# Patient Record
Sex: Female | Born: 1935 | Race: Black or African American | Hispanic: No | Smoking: Never smoker
Health system: Southern US, Community
[De-identification: ages and names within clinical notes are randomized; demographics above are authoritative.]

## PROBLEM LIST (undated history)

## (undated) DIAGNOSIS — E7800 Pure hypercholesterolemia, unspecified: Secondary | ICD-10-CM

## (undated) DIAGNOSIS — I639 Cerebral infarction, unspecified: Secondary | ICD-10-CM

## (undated) DIAGNOSIS — Z889 Allergy status to unspecified drugs, medicaments and biological substances status: Secondary | ICD-10-CM

## (undated) DIAGNOSIS — G459 Transient cerebral ischemic attack, unspecified: Secondary | ICD-10-CM

## (undated) DIAGNOSIS — R1013 Epigastric pain: Secondary | ICD-10-CM

## (undated) DIAGNOSIS — K259 Gastric ulcer, unspecified as acute or chronic, without hemorrhage or perforation: Secondary | ICD-10-CM

## (undated) DIAGNOSIS — R9431 Abnormal electrocardiogram [ECG] [EKG]: Secondary | ICD-10-CM

## (undated) DIAGNOSIS — M25473 Effusion, unspecified ankle: Secondary | ICD-10-CM

## (undated) DIAGNOSIS — C801 Malignant (primary) neoplasm, unspecified: Secondary | ICD-10-CM

## (undated) DIAGNOSIS — K219 Gastro-esophageal reflux disease without esophagitis: Secondary | ICD-10-CM

## (undated) DIAGNOSIS — D649 Anemia, unspecified: Secondary | ICD-10-CM

## (undated) DIAGNOSIS — I10 Essential (primary) hypertension: Secondary | ICD-10-CM

## (undated) HISTORY — DX: Essential (primary) hypertension: I10

## (undated) HISTORY — DX: Cerebral infarction, unspecified: I63.9

## (undated) HISTORY — PX: SHOULDER OPEN ROTATOR CUFF REPAIR: SHX2407

## (undated) HISTORY — DX: Transient cerebral ischemic attack, unspecified: G45.9

## (undated) HISTORY — PX: APPENDECTOMY: SHX54

## (undated) HISTORY — DX: Pure hypercholesterolemia, unspecified: E78.00

## (undated) HISTORY — PX: VAGINAL HYSTERECTOMY: SUR661

## (undated) HISTORY — DX: Epigastric pain: R10.13

## (undated) HISTORY — DX: Gastric ulcer, unspecified as acute or chronic, without hemorrhage or perforation: K25.9

## (undated) HISTORY — PX: CATARACT EXTRACTION, BILATERAL: SHX1313

## (undated) HISTORY — DX: Hemochromatosis, unspecified: E83.119

## (undated) HISTORY — PX: BREAST BIOPSY: SHX20

---

## 2001-09-20 ENCOUNTER — Ambulatory Visit (HOSPITAL_COMMUNITY): Admission: RE | Admit: 2001-09-20 | Discharge: 2001-09-20 | Payer: Self-pay | Admitting: General Surgery

## 2001-09-20 ENCOUNTER — Encounter: Payer: Self-pay | Admitting: General Surgery

## 2002-07-06 ENCOUNTER — Encounter: Payer: Self-pay | Admitting: General Surgery

## 2002-07-06 ENCOUNTER — Ambulatory Visit (HOSPITAL_COMMUNITY): Admission: RE | Admit: 2002-07-06 | Discharge: 2002-07-06 | Payer: Self-pay | Admitting: General Surgery

## 2002-09-21 ENCOUNTER — Encounter: Payer: Self-pay | Admitting: General Surgery

## 2002-09-21 ENCOUNTER — Ambulatory Visit (HOSPITAL_COMMUNITY): Admission: RE | Admit: 2002-09-21 | Discharge: 2002-09-21 | Payer: Self-pay | Admitting: General Surgery

## 2003-09-25 ENCOUNTER — Ambulatory Visit (HOSPITAL_COMMUNITY): Admission: RE | Admit: 2003-09-25 | Discharge: 2003-09-25 | Payer: Self-pay | Admitting: General Surgery

## 2003-10-04 ENCOUNTER — Ambulatory Visit (HOSPITAL_COMMUNITY): Admission: RE | Admit: 2003-10-04 | Discharge: 2003-10-04 | Payer: Self-pay | Admitting: General Surgery

## 2004-09-17 ENCOUNTER — Emergency Department (HOSPITAL_COMMUNITY): Admission: EM | Admit: 2004-09-17 | Discharge: 2004-09-17 | Payer: Self-pay | Admitting: Emergency Medicine

## 2004-10-31 ENCOUNTER — Ambulatory Visit (HOSPITAL_COMMUNITY): Admission: RE | Admit: 2004-10-31 | Discharge: 2004-10-31 | Payer: Self-pay | Admitting: General Surgery

## 2004-11-18 ENCOUNTER — Ambulatory Visit: Payer: Self-pay | Admitting: Orthopedic Surgery

## 2004-12-17 ENCOUNTER — Ambulatory Visit: Payer: Self-pay | Admitting: Orthopedic Surgery

## 2004-12-17 ENCOUNTER — Ambulatory Visit (HOSPITAL_COMMUNITY): Admission: RE | Admit: 2004-12-17 | Discharge: 2004-12-17 | Payer: Self-pay | Admitting: Orthopedic Surgery

## 2004-12-19 ENCOUNTER — Encounter (HOSPITAL_COMMUNITY): Admission: RE | Admit: 2004-12-19 | Discharge: 2005-01-18 | Payer: Self-pay | Admitting: Orthopedic Surgery

## 2004-12-19 ENCOUNTER — Ambulatory Visit: Payer: Self-pay | Admitting: Orthopedic Surgery

## 2004-12-30 ENCOUNTER — Ambulatory Visit: Payer: Self-pay | Admitting: Orthopedic Surgery

## 2005-01-20 ENCOUNTER — Encounter (HOSPITAL_COMMUNITY): Admission: RE | Admit: 2005-01-20 | Discharge: 2005-02-19 | Payer: Self-pay | Admitting: Orthopedic Surgery

## 2005-02-06 ENCOUNTER — Ambulatory Visit: Payer: Self-pay | Admitting: Orthopedic Surgery

## 2005-02-21 ENCOUNTER — Encounter (HOSPITAL_COMMUNITY): Admission: RE | Admit: 2005-02-21 | Discharge: 2005-03-23 | Payer: Self-pay | Admitting: Orthopedic Surgery

## 2005-03-20 ENCOUNTER — Ambulatory Visit: Payer: Self-pay | Admitting: Orthopedic Surgery

## 2005-03-24 ENCOUNTER — Encounter (HOSPITAL_COMMUNITY): Admission: RE | Admit: 2005-03-24 | Discharge: 2005-04-23 | Payer: Self-pay | Admitting: Orthopedic Surgery

## 2005-06-18 ENCOUNTER — Ambulatory Visit: Payer: Self-pay | Admitting: Orthopedic Surgery

## 2005-12-11 ENCOUNTER — Ambulatory Visit: Payer: Self-pay | Admitting: Orthopedic Surgery

## 2005-12-30 ENCOUNTER — Ambulatory Visit (HOSPITAL_COMMUNITY): Admission: RE | Admit: 2005-12-30 | Discharge: 2005-12-30 | Payer: Self-pay | Admitting: General Surgery

## 2006-02-05 ENCOUNTER — Ambulatory Visit: Payer: Self-pay | Admitting: Orthopedic Surgery

## 2006-03-19 ENCOUNTER — Ambulatory Visit: Payer: Self-pay | Admitting: Orthopedic Surgery

## 2006-06-17 ENCOUNTER — Ambulatory Visit: Payer: Self-pay | Admitting: Orthopedic Surgery

## 2006-09-30 ENCOUNTER — Ambulatory Visit: Payer: Self-pay | Admitting: Orthopedic Surgery

## 2006-12-15 ENCOUNTER — Ambulatory Visit: Payer: Self-pay | Admitting: Orthopedic Surgery

## 2006-12-17 ENCOUNTER — Encounter (HOSPITAL_COMMUNITY): Admission: RE | Admit: 2006-12-17 | Discharge: 2007-01-16 | Payer: Self-pay | Admitting: Orthopedic Surgery

## 2007-01-04 ENCOUNTER — Ambulatory Visit (HOSPITAL_COMMUNITY): Admission: RE | Admit: 2007-01-04 | Discharge: 2007-01-04 | Payer: Self-pay | Admitting: Family Medicine

## 2007-01-19 ENCOUNTER — Encounter (HOSPITAL_COMMUNITY): Admission: RE | Admit: 2007-01-19 | Discharge: 2007-02-18 | Payer: Self-pay | Admitting: Orthopedic Surgery

## 2007-01-26 ENCOUNTER — Ambulatory Visit (HOSPITAL_COMMUNITY): Admission: RE | Admit: 2007-01-26 | Discharge: 2007-01-26 | Payer: Self-pay | Admitting: Family Medicine

## 2007-01-28 ENCOUNTER — Ambulatory Visit: Payer: Self-pay | Admitting: Orthopedic Surgery

## 2007-02-03 ENCOUNTER — Ambulatory Visit: Payer: Self-pay | Admitting: Orthopedic Surgery

## 2007-02-05 ENCOUNTER — Ambulatory Visit (HOSPITAL_COMMUNITY): Admission: RE | Admit: 2007-02-05 | Discharge: 2007-02-05 | Payer: Self-pay | Admitting: Orthopedic Surgery

## 2007-02-11 ENCOUNTER — Ambulatory Visit: Payer: Self-pay | Admitting: Orthopedic Surgery

## 2007-02-18 ENCOUNTER — Ambulatory Visit: Payer: Self-pay | Admitting: Orthopedic Surgery

## 2007-08-17 ENCOUNTER — Encounter: Payer: Self-pay | Admitting: Orthopedic Surgery

## 2008-01-05 ENCOUNTER — Ambulatory Visit (HOSPITAL_COMMUNITY): Admission: RE | Admit: 2008-01-05 | Discharge: 2008-01-05 | Payer: Self-pay | Admitting: Family Medicine

## 2009-01-09 ENCOUNTER — Ambulatory Visit (HOSPITAL_COMMUNITY): Admission: RE | Admit: 2009-01-09 | Discharge: 2009-01-09 | Payer: Self-pay | Admitting: Family Medicine

## 2009-03-14 ENCOUNTER — Encounter (INDEPENDENT_AMBULATORY_CARE_PROVIDER_SITE_OTHER): Payer: Self-pay | Admitting: Neurology

## 2009-03-14 ENCOUNTER — Inpatient Hospital Stay (HOSPITAL_COMMUNITY): Admission: EM | Admit: 2009-03-14 | Discharge: 2009-03-16 | Payer: Self-pay | Admitting: Emergency Medicine

## 2009-03-15 ENCOUNTER — Encounter (INDEPENDENT_AMBULATORY_CARE_PROVIDER_SITE_OTHER): Payer: Self-pay | Admitting: Neurology

## 2009-04-18 ENCOUNTER — Ambulatory Visit (HOSPITAL_COMMUNITY): Admission: RE | Admit: 2009-04-18 | Discharge: 2009-04-18 | Payer: Self-pay | Admitting: Family Medicine

## 2009-10-20 ENCOUNTER — Emergency Department (HOSPITAL_COMMUNITY): Admission: EM | Admit: 2009-10-20 | Discharge: 2009-10-20 | Payer: Self-pay | Admitting: Emergency Medicine

## 2010-01-10 ENCOUNTER — Ambulatory Visit (HOSPITAL_COMMUNITY): Admission: RE | Admit: 2010-01-10 | Discharge: 2010-01-10 | Payer: Self-pay | Admitting: Family Medicine

## 2010-09-23 ENCOUNTER — Ambulatory Visit (HOSPITAL_COMMUNITY)
Admission: RE | Admit: 2010-09-23 | Discharge: 2010-09-23 | Payer: Self-pay | Source: Home / Self Care | Attending: Family Medicine | Admitting: Family Medicine

## 2010-11-17 ENCOUNTER — Emergency Department (HOSPITAL_COMMUNITY): Payer: Medicare Other

## 2010-11-17 ENCOUNTER — Emergency Department (HOSPITAL_COMMUNITY)
Admission: EM | Admit: 2010-11-17 | Discharge: 2010-11-17 | Disposition: A | Discharge status: Home / Self Care | Payer: Medicare Other | Attending: Emergency Medicine | Admitting: Emergency Medicine

## 2010-11-17 DIAGNOSIS — K2990 Gastroduodenitis, unspecified, without bleeding: Secondary | ICD-10-CM | POA: Insufficient documentation

## 2010-11-17 DIAGNOSIS — Z79899 Other long term (current) drug therapy: Secondary | ICD-10-CM | POA: Insufficient documentation

## 2010-11-17 DIAGNOSIS — Z8673 Personal history of transient ischemic attack (TIA), and cerebral infarction without residual deficits: Secondary | ICD-10-CM | POA: Insufficient documentation

## 2010-11-17 DIAGNOSIS — D649 Anemia, unspecified: Secondary | ICD-10-CM | POA: Insufficient documentation

## 2010-11-17 DIAGNOSIS — K2970 Gastritis, unspecified, without bleeding: Secondary | ICD-10-CM | POA: Insufficient documentation

## 2010-11-17 DIAGNOSIS — R1013 Epigastric pain: Secondary | ICD-10-CM | POA: Insufficient documentation

## 2010-11-17 DIAGNOSIS — M129 Arthropathy, unspecified: Secondary | ICD-10-CM | POA: Insufficient documentation

## 2010-11-17 DIAGNOSIS — I10 Essential (primary) hypertension: Secondary | ICD-10-CM | POA: Insufficient documentation

## 2010-11-17 LAB — TROPONIN I: Troponin I: 0.01 ng/mL (ref 0.00–0.06)

## 2010-11-17 LAB — URINALYSIS, ROUTINE W REFLEX MICROSCOPIC
Bilirubin Urine: NEGATIVE
Glucose, UA: NEGATIVE mg/dL
Hgb urine dipstick: NEGATIVE
Specific Gravity, Urine: 1.01 (ref 1.005–1.030)
pH: 7.5 (ref 5.0–8.0)

## 2010-11-17 LAB — COMPREHENSIVE METABOLIC PANEL
AST: 17 U/L (ref 0–37)
Albumin: 2.6 g/dL — ABNORMAL LOW (ref 3.5–5.2)
Chloride: 104 mEq/L (ref 96–112)
Creatinine, Ser: 1.05 mg/dL (ref 0.4–1.2)
GFR calc Af Amer: >60 mL/min (ref >60)
Potassium: 4.1 mEq/L (ref 3.5–5.1)
Total Bilirubin: 0.4 mg/dL (ref 0.3–1.2)
Total Protein: 6.1 g/dL (ref 6.0–8.3)

## 2010-11-17 LAB — DIFFERENTIAL
Lymphs Abs: 1.5 10*3/uL (ref 0.7–4.0)
Monocytes Relative: 5 % (ref 3–12)
Neutro Abs: 8.1 10*3/uL — ABNORMAL HIGH (ref 1.7–7.7)
Neutrophils Relative %: 78 % — ABNORMAL HIGH (ref 43–77)

## 2010-11-17 LAB — CK TOTAL AND CKMB (NOT AT ARMC)
CK, MB: 1.7 ng/mL (ref 0.3–4.0)
Relative Index: INVALID (ref 0.0–2.5)
Total CK: 69 U/L (ref 7–177)

## 2010-11-17 LAB — CBC
Hemoglobin: 9.3 g/dL — ABNORMAL LOW (ref 12.0–15.0)
MCH: 30.6 pg (ref 26.0–34.0)
MCV: 92.8 fL (ref 78.0–100.0)
RBC: 3.04 MIL/uL — ABNORMAL LOW (ref 3.87–5.11)
WBC: 10.3 10*3/uL (ref 4.0–10.5)

## 2010-11-17 LAB — POCT CARDIAC MARKERS
CKMB, poc: 2.8 ng/mL (ref 1.0–8.0)
Myoglobin, poc: 163 ng/mL (ref 12–200)
Troponin i, poc: 0.12 ng/mL — ABNORMAL HIGH (ref 0.00–0.09)

## 2010-11-17 MED ORDER — IOHEXOL 300 MG/ML  SOLN
100.0000 mL | Freq: Once | INTRAMUSCULAR | Status: AC | PRN
  Administered 2010-11-17: 100 mL via INTRAVENOUS

## 2010-11-18 LAB — URINE CULTURE
Colony Count: NO GROWTH
Culture: NO GROWTH

## 2010-12-08 LAB — GLUCOSE, CAPILLARY: Glucose-Capillary: 92 mg/dL (ref 70–99)

## 2010-12-09 LAB — DIFFERENTIAL
Basophils Absolute: 0 10*3/uL (ref 0.0–0.1)
Eosinophils Relative: 1 % (ref 0–5)
Lymphocytes Relative: 18 % (ref 12–46)
Lymphs Abs: 1.5 10*3/uL (ref 0.7–4.0)
Monocytes Absolute: 0.4 10*3/uL (ref 0.1–1.0)
Neutro Abs: 6.1 10*3/uL (ref 1.7–7.7)

## 2010-12-09 LAB — POCT I-STAT, CHEM 8
Calcium, Ion: 1.13 mmol/L (ref 1.12–1.32)
Glucose, Bld: 96 mg/dL (ref 70–99)
HCT: 37 % (ref 36.0–46.0)
Hemoglobin: 12.6 g/dL (ref 12.0–15.0)
TCO2: 28 mmol/L (ref 0–100)

## 2010-12-09 LAB — COMPREHENSIVE METABOLIC PANEL
AST: 28 U/L (ref 0–37)
Albumin: 3.2 g/dL — ABNORMAL LOW (ref 3.5–5.2)
BUN: 13 mg/dL (ref 6–23)
Calcium: 9.7 mg/dL (ref 8.4–10.5)
Chloride: 101 mEq/L (ref 96–112)
Creatinine, Ser: 0.89 mg/dL (ref 0.4–1.2)
GFR calc Af Amer: >60 mL/min (ref >60)
GFR calc non Af Amer: >60 mL/min (ref >60)
Total Bilirubin: 0.6 mg/dL (ref 0.3–1.2)

## 2010-12-09 LAB — BASIC METABOLIC PANEL
BUN: 10 mg/dL (ref 6–23)
CO2: 29 mEq/L (ref 19–32)
Chloride: 106 mEq/L (ref 96–112)
Creatinine, Ser: 0.85 mg/dL (ref 0.4–1.2)
Glucose, Bld: 95 mg/dL (ref 70–99)
Potassium: 3.7 mEq/L (ref 3.5–5.1)

## 2010-12-09 LAB — CBC
HCT: 37.4 % (ref 36.0–46.0)
Platelets: 227 10*3/uL (ref 150–400)
RDW: 13.9 % (ref 11.5–15.5)
WBC: 8 10*3/uL (ref 4.0–10.5)

## 2010-12-09 LAB — CK TOTAL AND CKMB (NOT AT ARMC)
CK, MB: 1.7 ng/mL (ref 0.3–4.0)
Relative Index: INVALID (ref 0.0–2.5)

## 2010-12-09 LAB — LIPID PANEL
Cholesterol: 234 mg/dL — ABNORMAL HIGH (ref 0–200)
HDL: 69 mg/dL (ref >39)
Total CHOL/HDL Ratio: 3.4 RATIO
VLDL: 44 mg/dL — ABNORMAL HIGH (ref 0–40)

## 2010-12-09 LAB — GLUCOSE, CAPILLARY: Glucose-Capillary: 86 mg/dL (ref 70–99)

## 2010-12-09 LAB — URINALYSIS, ROUTINE W REFLEX MICROSCOPIC
Glucose, UA: NEGATIVE mg/dL
Hgb urine dipstick: NEGATIVE
Ketones, ur: NEGATIVE mg/dL
Protein, ur: NEGATIVE mg/dL
Urobilinogen, UA: 0.2 mg/dL (ref 0.0–1.0)

## 2010-12-09 LAB — PROTIME-INR: Prothrombin Time: 13.6 seconds (ref 11.6–15.2)

## 2010-12-09 LAB — HEMOGLOBIN A1C: Hgb A1c MFr Bld: 5.6 % (ref 4.6–6.1)

## 2010-12-09 LAB — TROPONIN I: Troponin I: 0.02 ng/mL (ref 0.00–0.06)

## 2010-12-09 LAB — APTT: aPTT: 27 seconds (ref 24–37)

## 2010-12-18 ENCOUNTER — Ambulatory Visit (INDEPENDENT_AMBULATORY_CARE_PROVIDER_SITE_OTHER): Payer: Medicare Other | Admitting: Internal Medicine

## 2010-12-18 DIAGNOSIS — D649 Anemia, unspecified: Secondary | ICD-10-CM

## 2010-12-18 DIAGNOSIS — R1013 Epigastric pain: Secondary | ICD-10-CM

## 2010-12-18 DIAGNOSIS — R143 Flatulence: Secondary | ICD-10-CM

## 2010-12-20 ENCOUNTER — Other Ambulatory Visit (INDEPENDENT_AMBULATORY_CARE_PROVIDER_SITE_OTHER): Payer: Self-pay | Admitting: Internal Medicine

## 2010-12-20 ENCOUNTER — Encounter (HOSPITAL_BASED_OUTPATIENT_CLINIC_OR_DEPARTMENT_OTHER): Payer: Medicare Other | Admitting: Internal Medicine

## 2010-12-20 ENCOUNTER — Ambulatory Visit (HOSPITAL_COMMUNITY)
Admission: RE | Admit: 2010-12-20 | Discharge: 2010-12-20 | Disposition: A | Discharge status: Home / Self Care | Payer: Medicare Other | Source: Ambulatory Visit | Attending: Internal Medicine | Admitting: Internal Medicine

## 2010-12-20 DIAGNOSIS — K449 Diaphragmatic hernia without obstruction or gangrene: Secondary | ICD-10-CM | POA: Insufficient documentation

## 2010-12-20 DIAGNOSIS — I10 Essential (primary) hypertension: Secondary | ICD-10-CM | POA: Insufficient documentation

## 2010-12-20 DIAGNOSIS — Z79899 Other long term (current) drug therapy: Secondary | ICD-10-CM | POA: Insufficient documentation

## 2010-12-20 DIAGNOSIS — R1013 Epigastric pain: Secondary | ICD-10-CM

## 2010-12-20 DIAGNOSIS — K257 Chronic gastric ulcer without hemorrhage or perforation: Secondary | ICD-10-CM | POA: Insufficient documentation

## 2010-12-20 DIAGNOSIS — K259 Gastric ulcer, unspecified as acute or chronic, without hemorrhage or perforation: Secondary | ICD-10-CM

## 2010-12-20 DIAGNOSIS — K921 Melena: Secondary | ICD-10-CM | POA: Insufficient documentation

## 2010-12-20 DIAGNOSIS — R933 Abnormal findings on diagnostic imaging of other parts of digestive tract: Secondary | ICD-10-CM

## 2010-12-20 HISTORY — PX: ESOPHAGOGASTRODUODENOSCOPY: SHX1529

## 2011-01-05 NOTE — Op Note (Signed)
  NAMEGRANT, Dawn Church               ACCOUNT NO.:  [id number]  MEDICAL RECORD NO.:  [id number]           PATIENT TYPE:  O  LOCATION:  [facility]                          FACILITY:  [facility]  PHYSICIAN:  Lionel December, M.D.    DATE OF BIRTH:  10-05-1927  DATE OF PROCEDURE:  12/20/2010 DATE OF DISCHARGE:                              OPERATIVE REPORT   PROCEDURE:  Esophagogastroduodenoscopy.  INDICATIONS:  Dawn Church is an 77 year old African American female who experienced sudden onset of severe epigastric pain and was seen in the emergency room.  She had a CT which showed thickening to antral wall. She was seen in the office earlier this week and asked to discontinue her meloxicam.  She is undergoing diagnostic EGD.  Please note that when she was seen in the office her stool was guaiac positive.  Procedure risks were reviewed with the patient.  Informed consent was obtained.  MEDICATIONS FOR CONSCIOUS SEDATION:  Cetacaine spray for pharyngeal topical anesthesia, Demerol 25 mg IV, Versed 5 mg IV in divided dose.  FINDINGS:  Procedure performed in endoscopy suite.  The patient's vital signs and O2 saturations were monitored during the procedure and remained stable.  The patient was placed in left lateral recumbent position and Pentax videoscope was passed through oropharynx without any difficulty into esophagus.  Esophagus:  The mucosa of the esophagus was normal.  GE junction was located at 36 cm with noncritical ring and hiatus was at 38.  Stomach:  It was empty and distended very well with insufflation.  Folds of proximal stomach are normal.  Examination of mucosa and antrum revealed patchy erythema and a deep ulcer at least 20 mm in diameter. It had clean base.  The pyloric channel was patent.  Angularis, fundus, and cardia were examined by retroflexing scope and were unremarkable.  Duodenum:  Bulbar mucosa was normal.  Scope was passed into second part of duodenum where mucosa and folds  were normal.  Endoscope was withdrawn.  On the way out, multiple biopsies were taken from this ulcer margin. Endoscope was withdrawn.  The patient tolerated the procedure well.  FINAL DIAGNOSES: 1. Small sliding hiatal hernia with noncritical ring at     gastroesophageal junction. 2. Large gastric ulcer at antrum, suspect this is a chronic ulcer due     to nonsteroidal anti-inflammatory drug therapy.  Multiple biopsies     were taken for histology.  RECOMMENDATIONS:  No NSAIDs.  She should increase her Protonix to 40 mg twice daily.  Prescription written for 60 with 3 refills.  I will be contacting the patient results of biopsy and further recommendations.  Presuming histology is benign, she will return for repeat EGD in 10-12 weeks.  If biopsy is negative for H. pylori, then we will consider H. pylori serology.     Lionel December, M.D.     NR/MEDQ  D:  12/20/2010  T:  12/21/2010  Job:  [id number]  cc:   Angus G. Renard Matter, MD Fax: [phone]  Electronically Signed by Lionel December M.D. on 01/05/2011 09:49:01 PM

## 2011-01-05 NOTE — Consult Note (Signed)
NAMEALLANAH, Dawn Church               ACCOUNT NO.:  [id number]  MEDICAL RECORD NO.:  [id number]           PATIENT TYPE: AMB.  LOCATION: [place].                   [facility]  PHYSICIAN:  Lionel December, M.D.    DATE OF BIRTH:  1928/08/14  DATE :  12/18/2010                                 CONSULTATION   REASON FOR CONSULTATION:  Epigastric pain.  HISTORY OF PRESENT ILLNESS:  Dawn Church is an 77 year old female presenting today with complaints that on November 17, 2010 she had epigastric pain. She states she had frequent belching and flatus.  She was seen in the emergency room that day.  She underwent a CT of the abdomen and pelvis with contrast which revealed a small amount of free peritoneal fluid in the inferior pelvis on the right.  She also had a low density antral wall thickening, most likely due to antritis.  It does not have a typical appearance of a neoplasm.  She also had lab work.  Her hemoglobin was low at 9.3, hematocrit was 28.2, her MCV was 92.8, platelets were 309, and her WBC count was 10.3.  Sodium 140, potassium 4.1, chloride 104, carbon dioxide 27, glucose 99, BUN 14, creatinine 1.05, and calcium 9.3.  Total protein 6.1, albumin 2.6, AST 17, ALT 10, ALP 74, and total bilirubin was 0.4.  Lipase was 22.  CK total 69 and CK- MB was 1.7.  She was started on Protonix for 14 days and discharged. She says her symptoms have resolved now.  She says she is about 90% better.  She is taking the Protonix on a p.r.n. basis now.  She usually has 2 bowel movements a day.  They are brown in color.  She says when she started the iron they are darker.  She has been under a lot of stress with her daughter suffering a CVA.  Her daughter is in a nursing home in [place] at this time.  Her appetite is good.  There has been no weight loss.  She denies any appetite changes, no fever, and no acid reflux.  HOME MEDICATIONS: 1. Maxzide 37.5/25 one a day. 2. Ramipril 2.5 mg a  day. 3. Premarin 0.3 mg a day. 4. Meloxicam 15 mg a day. 5. Simvastatin 20 mg a day. 6. Protonix 40 mg as needed. 7. Hydrocodone 5/500 as needed. 8. Aspirin 81 mg a day. 9. Prilosec 1 a day. 10.Iron 325 a day. 11.MDI one a day. 12.Flaxseed 1000 mg a day.  ALLERGIES:  She is allergic to CODEINE.  PAST SURGICAL HISTORY:  She has had total hysterectomy.  She has had biopsies on both her breasts which were benign.  She has had an appendectomy.  PAST MEDICAL HISTORY:  Hypertension, high cholesterol, and she has back pain from a slipped disk and she has had a TIA in the past.  FAMILY HISTORY:  Her mother deceased from depression after husband was killed in an accident.  Her father deceased from an accident.  She does not have any sisters or brothers.  She is widowed.  She does not work. She does not smoke, drink, or do drugs.  She has 10 children.  Four of her children have diabetes but are in good health, two in good health, and one daughter has had 2 strokes and resides in a nursing home at this time.  OBJECTIVE:  VITAL SIGNS:  Her weight is 171, her height is 5 feet 5 inches, her temperature is 98.3, her blood pressure is 158/90, and her pulse is 72. GENERAL:  She is alert and oriented. HEENT:  She does have upper and lower dentures.  Her oral mucosa is moist.  There are no lesions.  Her conjunctivae are pink.  Her sclerae are anicteric. NECK:  Her thyroid is normal.  There is no cervical lymphadenopathy. LUNGS:  Clear. HEART:  Regular rate and rhythm. ABDOMEN:  Soft.  Bowel sounds are positive.  No masses.  Her stool was dark today and was guaiac positive.  ASSESSMENT:  Dawn Church is an 77 year old female with complaints of epigastric pain which is now resolved.  She does, however, have an abnormal CT which reveals low-density antral wall thickening, most likely due to antritis.  She also had a small amount of free peritoneal fluid in the inferior pelvis on the right.  She  was guaiac positive today.  I did discuss this case with Dr. Karilyn Cota.  Peptic ulcer disease needs to be ruled out.  Also, even the possibility of a gastric carcinoma would be in the differential.  RECOMMENDATIONS:  She will stop meloxicam.  I will get iron studies and  will schedule her for an EGD in the near future.    ______________________________ Dorene Ar, NP   ______________________________ Lionel December, M.D.    TS/MEDQ  D:  12/18/2010  T:  12/19/2010  Job:  [id number]  cc:   Angus G. Renard Matter, MD Fax: [phone]  Electronically Signed by Dorene Ar PA on 12/19/2010 05:17:35 PM Electronically Signed by Lionel December M.D. on 01/05/2011 09:48:41 PM

## 2011-01-14 NOTE — Procedures (Signed)
EEG:  06-823   HISTORY:  The patient is a 77 year old, who has episodes of right-sided  numbness and being nonverbal.  The study is being done to look for the  presence of seizures. (782.0)   PROCEDURE:  The tracing is carried out on a 32-channel digital Cadwell  recorder reformatted into 16 channel montages with one devoted to EKG.  The patient was sleep-deprived for the recording.  The International  10/20 system lead placement was used.  The patient did not drift into  natural sleep.   MEDICATIONS:  Clonidine, aspirin, Lovenox, Premarin, Maxzide, Reglan,  Tylenol, K-Dur, and Altace.   DESCRIPTION OF FINDINGS:  Dominant frequency is an 8-Hz 35 microvolt  activity that is well regulated.  Background activity is a mixture of  theta and delta range activity with the delta most prominent over the  left temporal derivation.   The patient drifts in to natural sleep with generalized delta range  activity and vertex sharp waves.  There was no interictal epileptiform  activity in the form of spikes or sharp waves.  EKG showed regular sinus  rhythm with ventricular response of 60 beats per minute.   IMPRESSION:  Abnormal EEG on the basis of mild slowing over the left  hemisphere.  This can be seen in a variety of conditions including  postictal state underlying structural and/or vascular abnormality.  Findings require careful clinical correlation.  No seizure activity was  seen.      Dawn Church. Sharene Skeans, M.D.  Electronically Signed     ZOX:WRUE  D:  03/16/2009 23:22:16  T:  03/17/2009 04:35:18  Job #:  [id number]   cc:   Pramod P. Pearlean Brownie, MD  Fax: [phone]
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
## 2011-01-14 NOTE — Procedures (Signed)
EEG:  06-820   REQUESTING PHYSICIAN:  Dr. MD L. Stroke.   CLINICAL HISTORY:  This is a 77 year old woman admitted with transient  right-sided numbness and weakness and aphasia, suspected TIA.  EEG is  for evaluation of possible seizure.  The patient is described as awake  and drowsy.  This is portable EEG done at bedside with photic  stimulation, but not hyperventilation.   DESCRIPTION:  The dominant rhythm in this tracing is a moderate-  amplitude alpha rhythm of 9-10 Hz, which predominates posteriorly,  appears without abnormal asymmetry, and attenuates by opening and  closing.  Low amplitude fast activity seen frontally and centrally and  appears without abnormal asymmetry.  No focal slowing is noted and no  epileptiform discharge is seen during awake.  Late in the recording,  little bit of drowsiness is seen as evidenced by slowing of  fragmentation of the background and appearance of some central dominant  slowing.  In face of this, a single spike discharge is seen at 19:19  emanating from the left temporal area.  The record continues for  approximately other 40-50 seconds in drowsiness, without any other  abnormalities seen.  Photic stimulation produced strong symmetric driver  responses.  Single-channel devoted to EKG revealed sinus rhythm  throughout with a rate of approximately 60 beats per minute.   CONCLUSIONS:  Borderline study due to the presence of a single left  temporal spike discharge seen in drowsiness very late in the recording.  The significance of this finding is uncertain, but may be relevant in  this patient with transient right-sided weakness and aphasia.  Sleep-  deprived EEG is recommended for further evaluation, along with careful  correlation with clinical information and neuroimaging.      Michael L. Thad Ranger, M.D.  Electronically Signed     HQI:ONGE  D:  03/15/2009 16:15:22  T:  03/16/2009 02:42:21  Job #:  [id number]
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
## 2011-01-30 ENCOUNTER — Encounter (INDEPENDENT_AMBULATORY_CARE_PROVIDER_SITE_OTHER): Payer: Medicare Other | Admitting: Internal Medicine

## 2011-03-14 ENCOUNTER — Encounter (INDEPENDENT_AMBULATORY_CARE_PROVIDER_SITE_OTHER): Payer: Self-pay | Admitting: Internal Medicine

## 2011-04-02 ENCOUNTER — Encounter (INDEPENDENT_AMBULATORY_CARE_PROVIDER_SITE_OTHER): Payer: Self-pay

## 2011-04-10 ENCOUNTER — Other Ambulatory Visit (INDEPENDENT_AMBULATORY_CARE_PROVIDER_SITE_OTHER): Payer: Self-pay | Admitting: *Deleted

## 2011-04-10 DIAGNOSIS — K219 Gastro-esophageal reflux disease without esophagitis: Secondary | ICD-10-CM

## 2011-04-10 DIAGNOSIS — K279 Peptic ulcer, site unspecified, unspecified as acute or chronic, without hemorrhage or perforation: Secondary | ICD-10-CM

## 2011-04-10 NOTE — Telephone Encounter (Signed)
Rec'd a fax for a Medication Refill for Pantoprazole 40 Mg Tablet, #60  . Last refilled 03-27-2011

## 2011-04-14 MED ORDER — PANTOPRAZOLE SODIUM 40 MG PO TBEC: DELAYED_RELEASE_TABLET | ORAL | Status: DC

## 2011-04-29 ENCOUNTER — Ambulatory Visit (INDEPENDENT_AMBULATORY_CARE_PROVIDER_SITE_OTHER): Payer: Medicare Other | Admitting: Internal Medicine

## 2011-05-07 ENCOUNTER — Other Ambulatory Visit (HOSPITAL_COMMUNITY): Payer: Self-pay | Admitting: Family Medicine

## 2011-05-07 DIAGNOSIS — Z139 Encounter for screening, unspecified: Secondary | ICD-10-CM

## 2011-05-09 ENCOUNTER — Ambulatory Visit (HOSPITAL_COMMUNITY)
Admission: RE | Admit: 2011-05-09 | Discharge: 2011-05-09 | Disposition: A | Discharge status: Home / Self Care | Payer: Medicare Other | Source: Ambulatory Visit | Attending: Family Medicine | Admitting: Family Medicine

## 2011-05-09 DIAGNOSIS — Z1231 Encounter for screening mammogram for malignant neoplasm of breast: Secondary | ICD-10-CM | POA: Insufficient documentation

## 2011-05-09 DIAGNOSIS — Z139 Encounter for screening, unspecified: Secondary | ICD-10-CM

## 2011-05-10 ENCOUNTER — Other Ambulatory Visit (INDEPENDENT_AMBULATORY_CARE_PROVIDER_SITE_OTHER): Payer: Self-pay | Admitting: Internal Medicine

## 2011-05-10 DIAGNOSIS — Z8719 Personal history of other diseases of the digestive system: Secondary | ICD-10-CM

## 2011-05-13 ENCOUNTER — Encounter (INDEPENDENT_AMBULATORY_CARE_PROVIDER_SITE_OTHER): Payer: Self-pay | Admitting: Internal Medicine

## 2011-05-13 ENCOUNTER — Other Ambulatory Visit (INDEPENDENT_AMBULATORY_CARE_PROVIDER_SITE_OTHER): Payer: Self-pay | Admitting: Internal Medicine

## 2011-05-13 ENCOUNTER — Ambulatory Visit (INDEPENDENT_AMBULATORY_CARE_PROVIDER_SITE_OTHER): Payer: Medicare Other | Admitting: Internal Medicine

## 2011-05-13 ENCOUNTER — Telehealth (INDEPENDENT_AMBULATORY_CARE_PROVIDER_SITE_OTHER): Payer: Self-pay | Admitting: *Deleted

## 2011-05-13 VITALS — BP 132/70 | HR 72 | Temp 97.8°F | Ht 66.5 in | Wt 167.5 lb

## 2011-05-13 DIAGNOSIS — Z1211 Encounter for screening for malignant neoplasm of colon: Secondary | ICD-10-CM

## 2011-05-13 DIAGNOSIS — K279 Peptic ulcer, site unspecified, unspecified as acute or chronic, without hemorrhage or perforation: Secondary | ICD-10-CM

## 2011-05-13 MED ORDER — PANTOPRAZOLE SODIUM 40 MG PO TBEC: 40.0000 mg | DELAYED_RELEASE_TABLET | Freq: Every day | ORAL | Status: DC

## 2011-05-13 MED ORDER — PANTOPRAZOLE SODIUM 40 MG PO TBEC: DELAYED_RELEASE_TABLET | ORAL | Status: DC

## 2011-05-13 NOTE — Progress Notes (Signed)
Subjective:     Patient ID: Dawn Church, female   DOB: 1927/11/08, 77 y.o.   MRN: [id number]  HPI  Dawn Church is here today for follow up. She was last seen in our office in April of this year for epigastric pain, belching, and flatus.  She also had an abnormal CT of the abdomen and pelvis which reveal a small amount of ree peritoneal fluid in the inferior pelvis on the right. She also had a low density antral wall thickening, most likely due to antritis.  She underwent and EGD in April which reveal small sliding hiatal hernia with non-critical ring at GE junction.  Large gastric ulcer at antrum, suspect this is a chronic ulcer due to non steroidal anti-inflammatory drug therapy. Biopsy: Findings consistent with ulcer. No H. Pylori, dysplasia or malignancy. She has never undergone a colonoscopy. She says she is doing good. She has stopped the Meloxicam. Appetite is good. No weight loss. No abdominal pain.   BMs are brown. No melena or bright red rectal bleeding. Review of Systems  See hpi     Current Outpatient Prescriptions  Medication Sig Dispense Refill  . aspirin 81 MG tablet Take 81 mg by mouth daily.        Marland Kitchen estrogens, conjugated, (PREMARIN) 0.3 MG tablet Take 0.3 mg by mouth daily. Take daily for 21 days then do not take for 7 days.       . ferrous gluconate (FERGON) 325 MG tablet Take 325 mg by mouth daily with breakfast.        . Flaxseed, Linseed, 1000 MG CAPS Take by mouth.        . hydrocodone-acetaminophen (LORCET-HD) 5-500 MG per capsule Take 1 capsule by mouth every 6 (six) hours as needed.        . pantoprazole (PROTONIX) 40 MG tablet Take 1 Tablet Twice A Day  60 tablet  2  . ramipril (ALTACE) 2.5 MG capsule Take 2.5 mg by mouth daily.        Marland Kitchen triamterene-hydrochlorothiazide (MAXZIDE-25) 37.5-25 MG per tablet Take 1 tablet by mouth daily.        Marland Kitchen omeprazole (PRILOSEC) 20 MG capsule Take 20 mg by mouth daily.        . pantoprazole (PROTONIX) 40 MG tablet TAKE 1 TABLET TWICE A  DAY  60 tablet  3   Past Medical History  Diagnosis Date  . Hypertension   . TIA (transient ischemic attack)   . Epigastric pain   . Stomach ulcer   . Hypertension   . Hemochromatosis   . CVA (cerebral infarction)     She describes as a light stroke  . Hypercholesteremia    Past Surgical History  Procedure Date  . Vaginal hysterectomy   . Appendectomy   . Esophagogastroduodenoscopy 12/20/2010  . Breast biopsy     Bilateral     Allergies  Allergen Reactions  . Codeine    History   Social History Narrative  . No narrative on file   History   Social History  . Marital Status: Widowed    Spouse Name: N/A    Number of Children: N/A  . Years of Education: N/A   Occupational History  . Not on file.   Social History Main Topics  . Smoking status: Never Smoker   . Smokeless tobacco: Not on file  . Alcohol Use: No  . Drug Use: No  . Sexually Active:    Other Topics Concern  . Not on file  Social History Narrative  . No narrative on file   No family history on file. Family Status  Relation Status Death Age  . Mother Deceased     CAD  . Father Deceased     accident  . Child Alive     four have diabetes. One daughter has a stroke and lives in a nursing homel. One other daughter has had a stroke. Two  in good health.   . Objective:   Physical ExamAlert and oriented. Skin warm and dry. Oral mucosa is moist.   Sclera anicteric, conjunctivae is pink. Thyroid not enlarged. No cervical lymphadenopathy. Lungs clear. Heart regular rate and rhythm.  Abdomen is soft. Bowel sounds are positive. No hepatomegaly. No abdominal masses felt. No tenderness.  No edema to lower extremities. Patient is alert and oriented.      Assessment:    Gastric ulcer at  Antrum.  Screening colonoscopy    Plan:      Repeat EGD to document healing of large gastric ulcer.  Also needs a colonoscopy

## 2011-05-13 NOTE — Telephone Encounter (Signed)
TCS/EGD sch'd 06/26/11 @ 1:00 (12:00), iron 10 days, asa 2 days, split movi prep instructions & sample given

## 2011-06-19 ENCOUNTER — Other Ambulatory Visit (INDEPENDENT_AMBULATORY_CARE_PROVIDER_SITE_OTHER): Payer: Self-pay | Admitting: *Deleted

## 2011-06-19 DIAGNOSIS — Z1211 Encounter for screening for malignant neoplasm of colon: Secondary | ICD-10-CM

## 2011-06-25 MED ORDER — SODIUM CHLORIDE 0.45 % IV SOLN
Freq: Once | INTRAVENOUS | Status: AC
  Administered 2011-06-26: 12:00:00 via INTRAVENOUS

## 2011-06-26 ENCOUNTER — Ambulatory Visit (HOSPITAL_COMMUNITY)
Admission: RE | Admit: 2011-06-26 | Discharge: 2011-06-26 | Disposition: A | Discharge status: Home / Self Care | Payer: Medicare Other | Source: Ambulatory Visit | Attending: Internal Medicine | Admitting: Internal Medicine

## 2011-06-26 ENCOUNTER — Encounter (HOSPITAL_COMMUNITY)
Admission: RE | Disposition: A | Discharge status: Home / Self Care | Payer: Self-pay | Source: Ambulatory Visit | Attending: Internal Medicine

## 2011-06-26 ENCOUNTER — Encounter (HOSPITAL_COMMUNITY): Payer: Self-pay | Admitting: *Deleted

## 2011-06-26 DIAGNOSIS — Z01812 Encounter for preprocedural laboratory examination: Secondary | ICD-10-CM | POA: Insufficient documentation

## 2011-06-26 DIAGNOSIS — I10 Essential (primary) hypertension: Secondary | ICD-10-CM | POA: Insufficient documentation

## 2011-06-26 DIAGNOSIS — Z7982 Long term (current) use of aspirin: Secondary | ICD-10-CM | POA: Insufficient documentation

## 2011-06-26 DIAGNOSIS — K2940 Chronic atrophic gastritis without bleeding: Secondary | ICD-10-CM | POA: Insufficient documentation

## 2011-06-26 DIAGNOSIS — Z79899 Other long term (current) drug therapy: Secondary | ICD-10-CM | POA: Insufficient documentation

## 2011-06-26 DIAGNOSIS — K449 Diaphragmatic hernia without obstruction or gangrene: Secondary | ICD-10-CM | POA: Insufficient documentation

## 2011-06-26 DIAGNOSIS — K5730 Diverticulosis of large intestine without perforation or abscess without bleeding: Secondary | ICD-10-CM | POA: Insufficient documentation

## 2011-06-26 DIAGNOSIS — D649 Anemia, unspecified: Secondary | ICD-10-CM

## 2011-06-26 DIAGNOSIS — K250 Acute gastric ulcer with hemorrhage: Secondary | ICD-10-CM

## 2011-06-26 DIAGNOSIS — Z1211 Encounter for screening for malignant neoplasm of colon: Secondary | ICD-10-CM

## 2011-06-26 DIAGNOSIS — K257 Chronic gastric ulcer without hemorrhage or perforation: Secondary | ICD-10-CM | POA: Insufficient documentation

## 2011-06-26 DIAGNOSIS — Z09 Encounter for follow-up examination after completed treatment for conditions other than malignant neoplasm: Secondary | ICD-10-CM | POA: Insufficient documentation

## 2011-06-26 HISTORY — DX: Anemia, unspecified: D64.9

## 2011-06-26 LAB — CBC
HCT: 36.2 % (ref 36.0–46.0)
Hemoglobin: 12 g/dL (ref 12.0–15.0)
MCHC: 33.1 g/dL (ref 30.0–36.0)
RBC: 3.74 MIL/uL — ABNORMAL LOW (ref 3.87–5.11)

## 2011-06-26 SURGERY — COLONOSCOPY WITH ESOPHAGOGASTRODUODENOSCOPY (EGD)
Anesthesia: Moderate Sedation

## 2011-06-26 MED ORDER — ENALAPRILAT 1.25 MG/ML IV SOLN
INTRAVENOUS | Status: DC | PRN
  Administered 2011-06-26 (×2): 0.625 mg via INTRAVENOUS
  Administered 2011-06-26: 1.25 mg via INTRAVENOUS

## 2011-06-26 MED ORDER — STERILE WATER FOR IRRIGATION IR SOLN
Status: DC | PRN
  Administered 2011-06-26: 13:00:00

## 2011-06-26 MED ORDER — PANTOPRAZOLE SODIUM 40 MG PO TBEC: 40.0000 mg | DELAYED_RELEASE_TABLET | Freq: Every day | ORAL | Status: DC

## 2011-06-26 MED ORDER — MEPERIDINE HCL 50 MG/ML IJ SOLN
INTRAMUSCULAR | Status: AC
  Filled 2011-06-26: qty 1

## 2011-06-26 MED ORDER — MIDAZOLAM HCL 5 MG/5ML IJ SOLN
INTRAMUSCULAR | Status: DC | PRN
  Administered 2011-06-26: 1 mg via INTRAVENOUS
  Administered 2011-06-26: 2 mg via INTRAVENOUS
  Administered 2011-06-26: 1 mg via INTRAVENOUS
  Administered 2011-06-26: 2 mg via INTRAVENOUS

## 2011-06-26 MED ORDER — ENALAPRILAT 1.25 MG/ML IV SOLN
INTRAVENOUS | Status: AC
  Filled 2011-06-26: qty 2

## 2011-06-26 MED ORDER — BUTAMBEN-TETRACAINE-BENZOCAINE 2-2-14 % EX AERO
INHALATION_SPRAY | CUTANEOUS | Status: DC | PRN
  Administered 2011-06-26: 2 via TOPICAL

## 2011-06-26 MED ORDER — MIDAZOLAM HCL 5 MG/5ML IJ SOLN
INTRAMUSCULAR | Status: AC
  Filled 2011-06-26: qty 10

## 2011-06-26 MED ORDER — MEPERIDINE HCL 50 MG/ML IJ SOLN
INTRAMUSCULAR | Status: DC | PRN
  Administered 2011-06-26 (×2): 25 mg via INTRAVENOUS

## 2011-06-26 NOTE — H&P (Signed)
Dawn Church is an 77 y.o. female.   Chief Complaint: Patient is here for EGD and colonoscopy. HPI: Patient is 77 year old African American female who presented back in March with severe abdominal pain and abnormal CT. She was found to have a large gastric ulcer on EGD. Biopsies were negative and her H. pylori serology was also negative. She's been maintained on double dose PPI and her symptoms of results. In March when she initially presented her hemoglobin was 9.3 she has not had this repeated. She denies melena rectal bleeding or abdominal pain. She's never been screened for colorectal carcinoma before. He is also undergoing EGD to document healing of gastric ulcer. Family history is negative for colorectal carcinoma.  Past Medical History  Diagnosis Date  . Hypertension   . TIA (transient ischemic attack)   . Epigastric pain   . Stomach ulcer   . Hypertension   . Hemochromatosis   . CVA (cerebral infarction)     She describes as a light stroke  . Hypercholesteremia   . Anemia     Past Surgical History  Procedure Date  . Vaginal hysterectomy   . Appendectomy   . Esophagogastroduodenoscopy 12/20/2010  . Breast biopsy     Bilateral    Family History  Problem Relation Age of Onset  . Colon cancer Neg Hx    Social History:  reports that she has never smoked. She has never used smokeless tobacco. She reports that she does not drink alcohol or use illicit drugs.  Allergies:  Allergies  Allergen Reactions  . Codeine Other (See Comments)    Caused patient to "space out"    Medications Prior to Admission  Medication Dose Route Frequency Provider Last Rate Last Dose  . 0.45 % sodium chloride infusion   Intravenous Once Malissa Hippo, MD 20 mL/hr at 06/26/11 1224     Medications Prior to Admission  Medication Sig Dispense Refill  . aspirin 81 MG tablet Take 81 mg by mouth daily.        Marland Kitchen estrogens, conjugated, (PREMARIN) 0.3 MG tablet Take 0.3 mg by mouth daily. Take  daily for 21 days then do not take for 7 days.      . ferrous gluconate (FERGON) 325 MG tablet Take 325 mg by mouth daily with breakfast.        . Flaxseed, Linseed, 1000 MG CAPS Take 1 capsule by mouth daily.       . hydrocodone-acetaminophen (LORCET-HD) 5-500 MG per capsule Take 1 capsule by mouth every 6 (six) hours as needed.        Marland Kitchen HYDROcodone-acetaminophen (VICODIN) 5-500 MG per tablet Take 1 tablet by mouth every 6 (six) hours as needed. Pain       . Multiple Vitamin (MULTIVITAMIN) tablet Take 1 tablet by mouth daily.        . pantoprazole (PROTONIX) 40 MG tablet Take 40 mg by mouth 2 (two) times daily.        . ramipril (ALTACE) 2.5 MG capsule Take 2.5 mg by mouth daily.        . hydroxypropyl methylcellulose (ISOPTO TEARS) 2.5 % ophthalmic solution Place 1 drop into both eyes daily as needed.        Marland Kitchen omeprazole (PRILOSEC) 20 MG capsule Take 20 mg by mouth daily.        . pantoprazole (PROTONIX) 40 MG tablet TAKE 1 TABLET TWICE A DAY  60 tablet  3    No results found for this or any  previous visit (from the past 48 hour(s)). No results found.  Review of Systems  Constitutional: Negative for weight loss.  Gastrointestinal: Negative for heartburn, nausea, vomiting, abdominal pain, diarrhea, constipation, blood in stool and melena.    Blood pressure 181/93, pulse 77, temperature 97.9 F (36.6 C), temperature source Oral, resp. rate 20, height 5' 6.5" (1.689 m), weight 167 lb (75.751 kg), SpO2 97.00%. Physical Exam  Constitutional: She appears well-developed and well-nourished.  HENT:  Mouth/Throat: Oropharynx is clear and moist.  Eyes: Conjunctivae are normal. No scleral icterus.  Neck: No thyromegaly present.  Cardiovascular: Normal rate, regular rhythm and normal heart sounds.   Respiratory: Effort normal and breath sounds normal.  GI: Soft. She exhibits no distension and no mass. There is no tenderness.  Musculoskeletal: She exhibits no edema.  Lymphadenopathy:    She has  no cervical adenopathy.  Neurological: She is alert.  Skin: Skin is warm and dry.     Assessment/Plan History of gastric ulcer. History of anemia possibly secondary to GI blood loss. Patient undergoing EGD to document healing of this ulcer and a screening colonoscopy  Viraat Vanpatten U 06/26/2011, 12:53 PM

## 2011-06-26 NOTE — Progress Notes (Signed)
Prior to beginning procedure at 1254, pt's BP 200/82, HR 67. Pt asymptomatic. Pt is on two BP medications, but hasn't taken them in 2 days prior to procedure. Dr. Karilyn Cota notified and gave orders for Enalapril 0.625mg  IV x 1. Medication was given. At 1306, BP 193/77, HR 71, Dr. Karilyn Cota notified and gave orders to give another 0.625mg  of Enalapril IV x1.  Will continue to monitor closely.

## 2011-06-26 NOTE — Op Note (Signed)
EGD AND COLONOSCOPY  PROCEDURE REPORT  PATIENT:  Dawn Church  MR#:  [id number] Birthdate:  [birth year], 77 y.o., female Endoscopist:  Dr. Malissa Hippo, MD Referred By:  Dr. Ishmael Holter. Renard Matter, M.D. Procedure Date: 06/26/2011  Procedure:   EGD & Colonoscopy  Indications:  Patient is 77 year old African American female found to have a large gastric ulcer at antrum in April this year biopsy was negative. H. pylori  was also negative. She is on double dose PPI currently free of GI symptoms. She is undergoing EGD to document healing of this ulcer. She is also undergoing cleaning colonoscopy.            Informed Consent: Both the  procedures and risks were reviewed with the patient and informed consent was obtained. Medications:  Demerol 50 mg IV Versed 6 mg IV Enalapril 2.5 mg IV for hypertension. Cetacaine spray topically for oropharyngeal anesthesia  EGD  Description of procedure:  The endoscope was introduced through the mouth and advanced to the second portion of the duodenum without difficulty or limitations. The mucosal surfaces were surveyed very carefully during advancement of the scope and upon withdrawal.  Findings:  Esophagus:  Normal esophageal mucosa. GEJ:  37 cm Hiatus:  39 cm Stomach:  Stomach was empty and distended very well with insufflation. Folds in the proximal stomach were normal. Examination of mucosa revealed prepyloric scar with focal/central mucosal edema and erythema. Pyloric channel was patent. Angularis fundus and cardia were examined by retroflexing the scope and were normal. Duodenum:  Normal bulbar and post bulbar mucosa  Therapeutic/Diagnostic Maneuvers Performed:  None  COLONOSCOPY Description of procedure:  After a digital rectal exam was performed, that colonoscope was advanced from the anus through the rectum and colon to the area of the cecum, ileocecal valve and appendiceal orifice. The cecum was deeply intubated. These structures were well-seen  and photographed for the record. From the level of the cecum and ileocecal valve, the scope was slowly and cautiously withdrawn. The mucosal surfaces were carefully surveyed utilizing scope tip to flexion to facilitate fold flattening as needed. The scope was pulled down into the rectum where a thorough exam including retroflexion was performed.  Findings:   Prep excellent. Two small diverticula at sigmoid colon. Mucosa of the colon and rectum was normal.. Anorectal junction was unremarkable.  Therapeutic/Diagnostic Maneuvers Performed:  None  Complications:  None  Cecal Withdrawal Time:  7 minutes  Impression:  Small sliding-type hernia. Gastric ulcer has healed; his residual gastritis in the center of scar. Normal colonoscopy except two small diverticula at sigmoid colon. Please note colonoscopy pictures are under EGD section.  Recommendations:  We will check CBC to make sure there H&H is now normal. Decrease pantoprazole to once a day and take it 30 minutes before breakfast( 40 mg ) She can continue aspirin 81 mg by mouth daily.   Nisaiah Bechtol U  06/26/2011 1:35 PM  CC: Dr. Alice Reichert, MD & Dr. Bonnetta Barry ref. provider found

## 2012-03-30 ENCOUNTER — Other Ambulatory Visit (HOSPITAL_COMMUNITY): Payer: Self-pay | Admitting: Family Medicine

## 2012-03-30 DIAGNOSIS — Z139 Encounter for screening, unspecified: Secondary | ICD-10-CM

## 2012-04-15 ENCOUNTER — Ambulatory Visit (INDEPENDENT_AMBULATORY_CARE_PROVIDER_SITE_OTHER): Payer: Medicare Other | Admitting: Family Medicine

## 2012-04-15 ENCOUNTER — Encounter: Payer: Self-pay | Admitting: Family Medicine

## 2012-04-15 VITALS — BP 154/76 | HR 58 | Resp 18 | Ht 63.0 in | Wt 165.0 lb

## 2012-04-15 DIAGNOSIS — M25559 Pain in unspecified hip: Secondary | ICD-10-CM

## 2012-04-15 DIAGNOSIS — M25551 Pain in right hip: Secondary | ICD-10-CM

## 2012-04-15 DIAGNOSIS — E785 Hyperlipidemia, unspecified: Secondary | ICD-10-CM

## 2012-04-15 DIAGNOSIS — Z8673 Personal history of transient ischemic attack (TIA), and cerebral infarction without residual deficits: Secondary | ICD-10-CM

## 2012-04-15 DIAGNOSIS — I10 Essential (primary) hypertension: Secondary | ICD-10-CM

## 2012-04-15 DIAGNOSIS — D509 Iron deficiency anemia, unspecified: Secondary | ICD-10-CM

## 2012-04-15 NOTE — Progress Notes (Signed)
  Subjective:    Patient ID: Dawn Church, female    DOB: [birth year], 78 y.o.   MRN: [id number]  HPI   Pt here to establish care, previous PCP Dr. Norina Buzzard- Dr. Romeo Apple Medications and History Reviewed Hyperlipidemia- pt has history prescribed statin but did not start because of potential side effects Post menopausal- has been on estrogen therapy for 20-30 years, would like to come off, Mammogram UTD Hi[p discomfort for many months,  No injury, she needs referral to go back to Dr. Romeo Apple her orthopedic surgeon, no xrays done History of remote CVA in 2009, no residual deficits   Review of Systems  GEN- denies fatigue, fever, weight loss,weakness, recent illness HEENT- denies eye drainage, change in vision, nasal discharge, CVS- denies chest pain, palpitations RESP- denies SOB, cough, wheeze ABD- denies N/V, change in stools, abd pain GU- denies dysuria, hematuria, dribbling, incontinence MSK- + joint pain, +muscle aches, injury Neuro- denies headache, dizziness, syncope, seizure activity      Objective:   Physical Exam GEN- NAD, alert and oriented x3 HEENT- PERRL, EOMI, non injected sclera, pink conjunctiva, MMM, oropharynx clear, dentures Neck- Supple,  CVS- RRR, no murmur RESP-CTAB ABD-NABS,soft,NT,ND EXT- No edema Pulses- Radial, DP- 2+ Psych-normal affect and Mood  Hips- normal IR/ER, discomfort on palpation below buttocks, no pain over greater trochanter Back- spine NT, neg SLR       Assessment & Plan:

## 2012-04-15 NOTE — Patient Instructions (Addendum)
Get the xrays of your hip and back  I will call about instructions for coming off the estrogen Continue the current medications for blood pressure I will refer you to Dr. Romeo Apple- for your hips  I will get your labs and records  F/U 3 months

## 2012-04-17 ENCOUNTER — Encounter: Payer: Self-pay | Admitting: Family Medicine

## 2012-04-17 DIAGNOSIS — Z8673 Personal history of transient ischemic attack (TIA), and cerebral infarction without residual deficits: Secondary | ICD-10-CM | POA: Insufficient documentation

## 2012-04-17 DIAGNOSIS — E785 Hyperlipidemia, unspecified: Secondary | ICD-10-CM | POA: Insufficient documentation

## 2012-04-17 DIAGNOSIS — M25551 Pain in right hip: Secondary | ICD-10-CM | POA: Insufficient documentation

## 2012-04-17 DIAGNOSIS — D509 Iron deficiency anemia, unspecified: Secondary | ICD-10-CM | POA: Insufficient documentation

## 2012-04-17 DIAGNOSIS — M25552 Pain in left hip: Secondary | ICD-10-CM | POA: Insufficient documentation

## 2012-04-17 DIAGNOSIS — I10 Essential (primary) hypertension: Secondary | ICD-10-CM | POA: Insufficient documentation

## 2012-04-17 NOTE — Assessment & Plan Note (Signed)
BP elevated today, will not change meds and trend

## 2012-04-17 NOTE — Assessment & Plan Note (Signed)
On supplement, obtain records for colonoscopy

## 2012-04-17 NOTE — Assessment & Plan Note (Signed)
She describes more of Hip discomfort instead of true joint pain and more in buttocks, I will obtain lumbar spine and xray of hip, referral to ortho at pt request

## 2012-04-28 ENCOUNTER — Ambulatory Visit (HOSPITAL_COMMUNITY)
Admission: RE | Admit: 2012-04-28 | Discharge: 2012-04-28 | Disposition: A | Discharge status: Home / Self Care | Payer: Medicare Other | Source: Ambulatory Visit | Attending: Family Medicine | Admitting: Family Medicine

## 2012-04-28 DIAGNOSIS — M545 Low back pain, unspecified: Secondary | ICD-10-CM | POA: Insufficient documentation

## 2012-04-28 DIAGNOSIS — M25551 Pain in right hip: Secondary | ICD-10-CM

## 2012-04-28 DIAGNOSIS — M25559 Pain in unspecified hip: Secondary | ICD-10-CM | POA: Insufficient documentation

## 2012-05-10 ENCOUNTER — Ambulatory Visit (HOSPITAL_COMMUNITY)
Admission: RE | Admit: 2012-05-10 | Discharge: 2012-05-10 | Disposition: A | Discharge status: Home / Self Care | Payer: Medicare Other | Source: Ambulatory Visit | Attending: Family Medicine | Admitting: Family Medicine

## 2012-05-10 DIAGNOSIS — Z139 Encounter for screening, unspecified: Secondary | ICD-10-CM

## 2012-05-10 DIAGNOSIS — Z1231 Encounter for screening mammogram for malignant neoplasm of breast: Secondary | ICD-10-CM | POA: Insufficient documentation

## 2012-05-11 ENCOUNTER — Ambulatory Visit: Payer: Medicare Other | Admitting: Orthopedic Surgery

## 2012-05-11 ENCOUNTER — Encounter: Payer: Self-pay | Admitting: Orthopedic Surgery

## 2012-05-11 ENCOUNTER — Ambulatory Visit (INDEPENDENT_AMBULATORY_CARE_PROVIDER_SITE_OTHER): Payer: Medicare Other | Admitting: Orthopedic Surgery

## 2012-05-11 VITALS — BP 144/86 | Ht 63.0 in | Wt 165.0 lb

## 2012-05-11 DIAGNOSIS — M545 Low back pain, unspecified: Secondary | ICD-10-CM

## 2012-05-11 DIAGNOSIS — M25559 Pain in unspecified hip: Secondary | ICD-10-CM

## 2012-05-11 MED ORDER — TRAMADOL-ACETAMINOPHEN 37.5-325 MG PO TABS: 1.0000 | ORAL_TABLET | Freq: Four times a day (QID) | ORAL | Status: AC | PRN

## 2012-05-11 NOTE — Patient Instructions (Signed)
Start PT at the hospital  A new med was sent to your pharmacy

## 2012-05-11 NOTE — Progress Notes (Signed)
  Subjective:    Patient ID: Dawn Church, female    DOB: 1928/06/07, 78 y.o.   MRN: [id number]  HPI Comments: This patient presents with bilateral hip pressure. She also complains of some numbness and tingling. A dull, aching pain in both hips. She denies any lower back pain at this time.  She denies any anterior thigh or groin pain.  Review of systems as recorded. See scanned documents   Hip Pain       Review of Systems     Objective:   Physical Exam  Constitutional: She is oriented to person, place, and time. She appears well-developed and well-nourished.  Cardiovascular: Intact distal pulses.   Musculoskeletal:       Lower extremity exam  Ambulation is normal.  Inspection and palpation revealed no tenderness or abnormality in alignment in the lower extremities. Range of motion is full.  Strength is grade 5.   all joints are stable.   lumbar spine non-tender   Lymphadenopathy:       Right: No inguinal adenopathy present.       Left: No inguinal adenopathy present.  Neurological: She is alert and oriented to person, place, and time. She has normal reflexes. She displays normal reflexes. No cranial nerve deficit. She exhibits normal muscle tone. Coordination normal.  Skin: Skin is warm and dry. No rash noted. No erythema. No pallor.  Psychiatric: She has a normal mood and affect. Her behavior is normal. Thought content normal.    X-rays of the hip and pelvis, August of this year were normal      Assessment & Plan:  On the x-rays of the pelvis. There is severe, degenerative disease in the lower lumbar spine segments from L4-S1.  Most likely the patient has degenerative disc disease with radicular symptoms into the hip area, which is actually in her buttocks and posterior part of her thigh.  Recommend physical therapy for the lumbar spine and back in a month to reassess the situation determine if any further treatment is necessary  Start tramadol with Tylenol as  Ultracet q.6 p.r.n. pain

## 2012-05-18 ENCOUNTER — Ambulatory Visit (HOSPITAL_COMMUNITY)
Admission: RE | Admit: 2012-05-18 | Discharge: 2012-05-18 | Disposition: A | Discharge status: Home / Self Care | Payer: Medicare Other | Source: Ambulatory Visit | Attending: Orthopedic Surgery | Admitting: Orthopedic Surgery

## 2012-05-18 DIAGNOSIS — M545 Low back pain, unspecified: Secondary | ICD-10-CM | POA: Insufficient documentation

## 2012-05-18 DIAGNOSIS — M25559 Pain in unspecified hip: Secondary | ICD-10-CM | POA: Insufficient documentation

## 2012-05-18 DIAGNOSIS — M79609 Pain in unspecified limb: Secondary | ICD-10-CM | POA: Insufficient documentation

## 2012-05-18 DIAGNOSIS — R269 Unspecified abnormalities of gait and mobility: Secondary | ICD-10-CM | POA: Insufficient documentation

## 2012-05-18 DIAGNOSIS — IMO0001 Reserved for inherently not codable concepts without codable children: Secondary | ICD-10-CM | POA: Insufficient documentation
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
## 2012-05-19 ENCOUNTER — Ambulatory Visit (HOSPITAL_COMMUNITY)
Admission: RE | Admit: 2012-05-19 | Discharge: 2012-05-19 | Disposition: A | Discharge status: Home / Self Care | Payer: Medicare Other | Source: Ambulatory Visit | Attending: Family Medicine | Admitting: Family Medicine

## 2012-05-19 NOTE — Progress Notes (Signed)
Physical Therapy Treatment Patient Details  Name: Dawn Church MRN: [id number] Date of Birth: [birth year]  Today's Date: 05/19/2012 Time: 1100 (11:00-11:15 with Becky Sax, PTA)-1140 PT Time Calculation (min): 40 min  Visit#: 2  of 12   Re-eval: 05/18/12 Charges:  Manual 28', therex 10' Authorization: Medicare  Authorization Visit#: 2  of 10    Subjective: Symptoms/Limitations Symptoms: Pt stated overall soreness following yesterday's session. Pain Assessment Currently in Pain?: Yes Pain Score:   4 Pain Location: Hip Pain Orientation: Right;Left (R> L)   Exercise/Treatments Aerobic Tread Mill: 1.2 x 5 ' following MET Prone  Single Arm Raise: 5 reps Straight Leg Raise: 5 reps   Manual Therapy Manual Therapy: Other (comment) Massage: STM / MFR To B buttocks, hips to decrease pain and spasm. Other Manual Therapy: MET for R IR with 5 minutes on treadmill @ 1.2 mph, decreased pain with ER improved gait mechanics following x 15'  Physical Therapy Assessment and Plan PT Assessment and Plan Clinical Impression Statement: MET for R IR performed by Becky Sax, PTA with decreased pain with ER and improved gait mechanics following.  Instructed with prone stab exercises with pt. exhibiting poor control due to weakness.  Multimodal cues required.  Large spasm palpated in R piriformis region with reduction of 80% following manual .  Pt. with overall improved symptoms at end of session. PT Plan: Continue with core stabalization exercises and flexibility. Manual techniques, modalities to decrease pain (MET, SCS, STM).      Problem List Patient Active Problem List  Diagnosis  . Essential hypertension, benign  . Hip pain, bilateral  . Hyperlipidemia  . H/O: stroke  . Iron deficiency anemia  . Lower back pain  . Hip pain  . Abnormality of gait    PT - End of Session Activity Tolerance: Patient tolerated treatment well General Behavior During Session: [facility] for tasks  performed Cognition: [facility] for tasks performed   Lurena Nida, PTA/CLT 05/19/2012, 11:59 AM

## 2012-05-25 ENCOUNTER — Ambulatory Visit (HOSPITAL_COMMUNITY)
Admission: RE | Admit: 2012-05-25 | Discharge: 2012-05-25 | Disposition: A | Discharge status: Home / Self Care | Payer: Medicare Other | Source: Ambulatory Visit | Attending: Orthopedic Surgery | Admitting: Orthopedic Surgery

## 2012-05-25 NOTE — Progress Notes (Signed)
Physical Therapy Treatment Patient Details  Name: Dawn Church MRN: [id number] Date of Birth: [birth year]  Today's Date: 05/25/2012 Time: 1020-1100 PT Time Calculation (min): 40 min  Visit#: 3  of 12   Re-eval: 06/17/12  Charge: Manual 27', therex 13'  Authorization: Medicare  Authorization Time Period:    Authorization Visit#: 3  of 10    Subjective: Symptoms/Limitations Symptoms: Pt reported stiff today, cold weather outside.  Pt thinks she may be performing HEP too much and reason for soreness Pain Assessment Currently in Pain?: Yes Pain Score:  (soreness, no pain scale)  Objective:   Exercise/Treatments Aerobic Tread Mill: 1.2 x 5 ' following MET Supine Ab Set: Limitations AB Set Limitations: NMR to establish correct activitation using TC's and VC's. 2x10 sec holds after Bridge: 10 reps Prone  Single Arm Raise: 5 reps Other Prone Lumbar Exercises: NMR for multidifus contraction using anterior tilt 2x10 sec holds  Manual Therapy Manual Therapy: Massage Massage: STM to R buttocks and hips to decrease pain and spasms x 12 Other Manual Therapy: MET for L IR with 5 minutes on treadmill @ 1.2 mph, improved hip ROM and decreased pain with ER improved gait mechanics following x 15'  Physical Therapy Assessment and Plan PT Assessment and Plan Clinical Impression Statement: MET for L LE IR corrected at beginning of session with improved gait mechanics noted following and no c/o pain through session.  Pt continues to exhibit poor control with core musculature requiring tactile cueing with core activation.    Goals    Problem List Patient Active Problem List  Diagnosis  . Essential hypertension, benign  . Hip pain, bilateral  . Hyperlipidemia  . H/O: stroke  . Iron deficiency anemia  . Lower back pain  . Hip pain  . Abnormality of gait    PT - End of Session Activity Tolerance: Patient tolerated treatment well General Behavior During Session: [facility] for tasks  performed Cognition: [facility] for tasks performed  GP    Juel Burrow 05/25/2012, 2:24 PM

## 2012-05-26 ENCOUNTER — Ambulatory Visit (HOSPITAL_COMMUNITY)
Admission: RE | Admit: 2012-05-26 | Discharge: 2012-05-26 | Disposition: A | Discharge status: Home / Self Care | Payer: Medicare Other | Source: Ambulatory Visit | Attending: Family Medicine | Admitting: Family Medicine

## 2012-05-26 NOTE — Progress Notes (Signed)
Physical Therapy Treatment Patient Details  Name: Dawn Church MRN: [id number] Date of Birth: Jan 04, 1928  Today's Date: 05/26/2012 Time: 1020-1102 PT Time Calculation (min): 42 min  Visit#: 4  of 12   Re-eval: 06/17/12  Charge: therex 38'  Authorization: Medicare  Authorization Time Period:    Authorization Visit#: 4  of 10    Subjective: Symptoms/Limitations Symptoms: Pt reports she is sore today, and stiff can tell the rain is on its way. Pain Assessment Currently in Pain?: No/denies Pain Score:  (soreness/stiffness)  Objective:   Exercise/Treatments Aerobic Tread Mill: 1.5 x 5' at end of session Standing Functional Squats: 5 reps;Limitations Functional Squats Limitations: max tactile cueing for proper form Seated Other Seated Lumbar Exercises: Heel Roll outs 10 x10 sec holds Supine Ab Set: Limitations AB Set Limitations: NMR to establish correct activitation using TC's and VC's. 10x10 sec holds after Bent Knee Raise: 5 reps;5 seconds Bridge: 15 reps Straight Leg Raise: 10 reps;Limitations Straight Leg Raises Limitations: with ab set Isometric Hip Flexion: 10 reps;5 seconds Prone  Single Arm Raise: 10 reps Straight Leg Raise: 10 reps Opposite Arm/Leg Raise: Right arm/Left leg;Left arm/Right leg;5 reps;5 seconds Other Prone Lumbar Exercises: NMR for multidifus contraction using anterior tilt 10x10 sec holds  Physical Therapy Assessment and Plan PT Assessment and Plan Clinical Impression Statement: SI within alignment at beginning of session, no MET required this session.  Added therex for weak hip flexion strengthening, pt does require multimodal cueing for proper form and technique with therex.  Improved gait mechanics noted at end of sesson. PT Plan: Continue with core stabalization exercises and flexibility. Manual techniques, modalities to decrease pain (MET, SCS, STM    Goals    Problem List Patient Active Problem List  Diagnosis  . Essential  hypertension, benign  . Hip pain, bilateral  . Hyperlipidemia  . H/O: stroke  . Iron deficiency anemia  . Lower back pain  . Hip pain  . Abnormality of gait    PT - End of Session Activity Tolerance: Patient tolerated treatment well General Behavior During Session: [facility] for tasks performed Cognition: [facility] for tasks performed  GP    Juel Burrow 05/26/2012, 11:52 AM

## 2012-05-28 ENCOUNTER — Ambulatory Visit (HOSPITAL_COMMUNITY)
Admission: RE | Admit: 2012-05-28 | Discharge: 2012-05-28 | Disposition: A | Discharge status: Home / Self Care | Payer: Medicare Other | Source: Ambulatory Visit | Attending: Family Medicine | Admitting: Family Medicine

## 2012-05-28 NOTE — Progress Notes (Signed)
Physical Therapy Treatment Patient Details  Name: Dawn Church MRN: [id number] Date of Birth: September 15, 1927  Today's Date: 05/28/2012 Time: 4098-1191 PT Time Calculation (min): 39 min  Visit#: 5  of 12   Re-eval: 06/17/12 Charges: Therex x 38'  Authorization: Medicare  Authorization Visit#: 5  of 10    Subjective: Symptoms/Limitations Symptoms: Pt states that she is stiff and sore. Pain Assessment Currently in Pain?: No/denies Pain Score:  (soreness/stiffness)   Exercise/Treatments Standing Functional Squats: 10 reps Seated Other Seated Lumbar Exercises: Heel Roll outs 10 x10 sec holds Supine Ab Set: 10 reps AB Set Limitations: 10x10"  Bent Knee Raise: 10 reps;5 seconds Bridge: 15 reps;5 seconds Straight Leg Raise: 10 reps;Limitations Straight Leg Raises Limitations: with ab set Isometric Hip Flexion: 10 reps;5 seconds Prone  Single Arm Raise: 10 reps Straight Leg Raise: 10 reps Opposite Arm/Leg Raise: 10 reps;5 seconds;Right arm/Left leg;Left arm/Right leg  Physical Therapy Assessment and Plan PT Assessment and Plan Clinical Impression Statement: SI assessed at beginning of session. NO MET needed. No treadmill completed this session secondary to pt's footwear. Pt requires decreased cueing for technique with all therex. Pt reports decreased soreness and tightness at end of session. PT Plan: Continue with core stabalization exercises and flexibility. Manual techniques, modalities to decrease pain (MET, SCS, STM)     Problem List Patient Active Problem List  Diagnosis  . Essential hypertension, benign  . Hip pain, bilateral  . Hyperlipidemia  . H/O: stroke  . Iron deficiency anemia  . Lower back pain  . Hip pain  . Abnormality of gait    PT - End of Session Activity Tolerance: Patient tolerated treatment well General Behavior During Session: [facility] for tasks performed Cognition: [facility] for tasks performed   Seth Bake, PTA 05/28/2012, 10:19 AM

## 2012-05-30 ENCOUNTER — Emergency Department (HOSPITAL_COMMUNITY)
Admission: EM | Admit: 2012-05-30 | Discharge: 2012-05-31 | Disposition: A | Discharge status: Home / Self Care | Payer: Medicare Other | Attending: Emergency Medicine | Admitting: Emergency Medicine

## 2012-05-30 ENCOUNTER — Emergency Department (HOSPITAL_COMMUNITY): Payer: Medicare Other

## 2012-05-30 ENCOUNTER — Encounter (HOSPITAL_COMMUNITY): Payer: Self-pay | Admitting: *Deleted

## 2012-05-30 DIAGNOSIS — Z8673 Personal history of transient ischemic attack (TIA), and cerebral infarction without residual deficits: Secondary | ICD-10-CM | POA: Insufficient documentation

## 2012-05-30 DIAGNOSIS — E7800 Pure hypercholesterolemia, unspecified: Secondary | ICD-10-CM | POA: Insufficient documentation

## 2012-05-30 DIAGNOSIS — D649 Anemia, unspecified: Secondary | ICD-10-CM | POA: Insufficient documentation

## 2012-05-30 DIAGNOSIS — I10 Essential (primary) hypertension: Secondary | ICD-10-CM | POA: Insufficient documentation

## 2012-05-30 DIAGNOSIS — J40 Bronchitis, not specified as acute or chronic: Secondary | ICD-10-CM | POA: Insufficient documentation

## 2012-05-30 HISTORY — DX: Allergy status to unspecified drugs, medicaments and biological substances: Z88.9

## 2012-05-30 LAB — CBC
HCT: 33.1 % — ABNORMAL LOW (ref 36.0–46.0)
MCH: 31.5 pg (ref 26.0–34.0)
MCV: 93 fL (ref 78.0–100.0)
Platelets: 196 10*3/uL (ref 150–400)
RBC: 3.56 MIL/uL — ABNORMAL LOW (ref 3.87–5.11)
WBC: 9.5 10*3/uL (ref 4.0–10.5)

## 2012-05-30 LAB — BASIC METABOLIC PANEL
BUN: 12 mg/dL (ref 6–23)
CO2: 28 mEq/L (ref 19–32)
Calcium: 9.6 mg/dL (ref 8.4–10.5)
Chloride: 96 mEq/L (ref 96–112)
Creatinine, Ser: 0.95 mg/dL (ref 0.50–1.10)
Glucose, Bld: 111 mg/dL — ABNORMAL HIGH (ref 70–99)

## 2012-05-30 MED ORDER — ALBUTEROL SULFATE HFA 108 (90 BASE) MCG/ACT IN AERS
2.0000 | INHALATION_SPRAY | RESPIRATORY_TRACT | Status: DC
  Administered 2012-05-30: 2 via RESPIRATORY_TRACT
  Filled 2012-05-30: qty 6.7

## 2012-05-30 NOTE — ED Notes (Signed)
Pt c/o cough, head, and chest congestion.

## 2012-05-30 NOTE — ED Notes (Addendum)
Cough since Thursday, nonproductive, temp on arrival of 100.5.  Family states that she usually gets bronchitis this time of year.

## 2012-05-30 NOTE — ED Provider Notes (Signed)
History  This chart was scribed for Lyanne Co, MD by Erskine Emery. This patient was seen in room APA02/APA02 and the patient's care was started at 23:08.   CSN: [id number]  Arrival date & time 05/30/12  2103   First MD Initiated Contact with Patient 05/30/12 2308      Chief Complaint  Patient presents with  . Cough  . Nasal Congestion    (Consider location/radiation/quality/duration/timing/severity/associated sxs/prior treatment) The history is provided by the patient. No language interpreter was used.  Dawn Church is a 78 y.o. female who presents to the Emergency Department complaining of a gradually worsening productive cough since Thursday. Pt denies any associated SOB, abdominal pain, nausea, emesis, diarrhea, or chills but does report she had a fever of 101 while in triage that has since subsided. Pt's daughter reports she gets bronchitis this season every year. Pt denies any h/o asthma, DM, COPD, or smoking but she does have a h/o HTN.  Past Medical History  Diagnosis Date  . TIA (transient ischemic attack)   . Epigastric pain   . Stomach ulcer   . Hemochromatosis   . Anemia   . CVA (cerebral infarction)   . Hypercholesteremia   . Hypertension   . Hx of seasonal allergies     Past Surgical History  Procedure Date  . Vaginal hysterectomy   . Appendectomy   . Esophagogastroduodenoscopy 12/20/2010  . Breast biopsy     Bilateral  . Breast biopsy     bilateral  . Shoulder open rotator cuff repair     right   . Cataract extraction, bilateral     Family History  Problem Relation Age of Onset  . Colon cancer Neg Hx   . Diabetes      History  Substance Use Topics  . Smoking status: Never Smoker   . Smokeless tobacco: Never Used  . Alcohol Use: No    OB History    Grav Para Term Preterm Abortions TAB SAB Ect Mult Living                  Review of Systems A complete 10 system review of systems was obtained and all systems are negative except as  noted in the HPI and PMH.    Allergies  Codeine  Home Medications   Current Outpatient Rx  Name Route Sig Dispense Refill  . ASPIRIN 81 MG PO TABS Oral Take 81 mg by mouth daily.      Marland Kitchen ESTROGENS CONJUGATED 0.3 MG PO TABS Oral Take 0.3 mg by mouth daily. Take daily for 21 days then do not take for 7 days.    . FERROUS GLUCONATE 325 MG PO TABS Oral Take 325 mg by mouth daily with breakfast.      . FLAXSEED (LINSEED) 1000 MG PO CAPS Oral Take 1 capsule by mouth as needed.     Marland Kitchen HYDROCODONE-ACETAMINOPHEN 5-500 MG PO CAPS Oral Take 1 capsule by mouth every 6 (six) hours as needed.      Marland Kitchen HYPROMELLOSE 2.5 % OP SOLN Both Eyes Place 1 drop into both eyes daily as needed.      Marland Kitchen ONE-DAILY MULTI VITAMINS PO TABS Oral Take 1 tablet by mouth daily.      Marland Kitchen PANTOPRAZOLE SODIUM 40 MG PO TBEC Oral Take 1 tablet (40 mg total) by mouth daily before breakfast. 30 tablet 5  . RAMIPRIL 2.5 MG PO CAPS Oral Take 2.5 mg by mouth daily.      Marland Kitchen  TRIAMTERENE-HCTZ 37.5-25 MG PO TABS Oral Take 1 tablet by mouth daily.       Triage Vitals: BP 199/88  Pulse 95  Temp 100.5 F (38.1 C) (Oral)  Resp 20  Ht 5\' 4"  (1.626 m)  Wt 165 lb (74.844 kg)  BMI 28.32 kg/m2  SpO2 96%  Physical Exam  Constitutional: She is oriented to person, place, and time. She appears well-developed and well-nourished.  HENT:  Head: Normocephalic.  Eyes: EOM are normal.  Neck: Normal range of motion.  Pulmonary/Chest: Effort normal.       Rhonchi the right upper lobe.  Abdominal: Soft. She exhibits no distension.  Musculoskeletal: Normal range of motion.  Neurological: She is alert and oriented to person, place, and time.  Psychiatric: She has a normal mood and affect.    ED Course  Procedures (including critical care time) DIAGNOSTIC STUDIES: Oxygen Saturation is 96% on room air, adequate by my interpretation.    COORDINATION OF CARE: 23:15--I evaluated the patient and we discussed a treatment plan including albuterol  inhaler, blood work, and chest x-ray to which the pt agreed.    Labs Reviewed  CBC - Abnormal; Notable for the following:    RBC 3.56 (*)     Hemoglobin 11.2 (*)     HCT 33.1 (*)     All other components within normal limits  BASIC METABOLIC PANEL - Abnormal; Notable for the following:    Potassium 3.0 (*)     Glucose, Bld 111 (*)     GFR calc non Af Amer 54 (*)     GFR calc Af Amer 63 (*)     All other components within normal limits   Dg Chest 2 View  05/31/2012  *RADIOLOGY REPORT*  Clinical Data: Cough.  Chest congestion.  CHEST - 2 VIEW  Comparison: 04/18/2009  Findings: The lungs are clear without focal infiltrate, edema, pneumothorax or pleural effusion. Cardiopericardial silhouette is at upper limits of normal for size. Imaged bony structures of the thorax are intact.  The patient is status post prior right rotator cuff repair and distal right clavicle resection.  IMPRESSION: Borderline cardiomegaly without acute cardiopulmonary findings.   Original Report Authenticated By: ERIC A. MANSELL, M.D.     I personally reviewed the imaging tests through PACS system  I reviewed available ER/hospitalization records thought the EMR   1. Bronchitis       MDM  Patient symptoms are consistent with bronchitis.  Her chest x-ray is clear.  Albuterol inhaler to help with cough.  She is well appearing.  She does have mild low-grade fever of 100.5.  Symptomatic treatment) CP followup.  Will hold antibiotics at this time.      I personally performed the services described in this documentation, which was scribed in my presence. The recorded information has been reviewed and considered.      Lyanne Co, MD 05/31/12 0130

## 2012-05-31 NOTE — ED Notes (Signed)
Patient transported to X-ray 

## 2012-06-01 ENCOUNTER — Ambulatory Visit (HOSPITAL_COMMUNITY): Payer: Medicare Other | Admitting: Physical Therapy

## 2012-06-01 ENCOUNTER — Encounter: Payer: Self-pay | Admitting: Family Medicine

## 2012-06-01 ENCOUNTER — Ambulatory Visit (INDEPENDENT_AMBULATORY_CARE_PROVIDER_SITE_OTHER): Payer: Medicare Other | Admitting: Family Medicine

## 2012-06-01 VITALS — BP 155/84 | HR 78 | Temp 98.7°F | Resp 16 | Ht 63.0 in | Wt 166.8 lb

## 2012-06-01 DIAGNOSIS — J40 Bronchitis, not specified as acute or chronic: Secondary | ICD-10-CM

## 2012-06-01 DIAGNOSIS — I10 Essential (primary) hypertension: Secondary | ICD-10-CM

## 2012-06-01 NOTE — Progress Notes (Signed)
  Subjective:    Patient ID: Dawn Church, female    DOB: 12/12/1927, 78 y.o.   MRN: [id number]  HPI Pt here to followup ER visit for cough diagnosed with bronchitis and given albuterol inhaler. Chest x-ray was negative for infiltrate. No hypoxia noted. She states she feels much better however continues to have cough and would like a cough medication, denies hemoptysis, mild production   Review of Systems  GEN- + fatigue, fever, weight loss,weakness, recent illness HEENT- denies eye drainage, change in vision, nasal discharge, CVS- denies chest pain, palpitations RESP- denies SOB,+ cough, wheeze ABD- denies N/V, change in stools, abd pain GU- denies dysuria, hematuria, dribbling, incontinence MSK- denies joint pain, muscle aches, injury Neuro- denies headache, dizziness, syncope, seizure activity      Objective:   Physical Exam GEN- NAD, alert and oriented x3, fatigued appearing HEENT- PERRL, EOMI, non injected sclera, pink conjunctiva, MMM, oropharynx clear, Neck- Supple, no LAD, no retractions CVS- RRR, no murmur RESP-CTAB ABD-NABS,soft,NT,ND EXT- No edema Pulses- Radial, DP- 2+        Assessment & Plan:   She has stopped hormone therapy cold [place] without any effects

## 2012-06-01 NOTE — Patient Instructions (Signed)
Continue with fluids  Cough Syrup to be called in If you worsen please call and I will send antibiotics  Continue albuterol puff every 6 hours-  F/U 2 weeks

## 2012-06-02 ENCOUNTER — Ambulatory Visit (HOSPITAL_COMMUNITY): Payer: Medicare Other | Admitting: Physical Therapy

## 2012-06-02 DIAGNOSIS — J40 Bronchitis, not specified as acute or chronic: Secondary | ICD-10-CM | POA: Insufficient documentation

## 2012-06-02 NOTE — Assessment & Plan Note (Signed)
Pt improving, exam benign, no hypoxia, neg xray, hold on antibiotics, if she worsens would start Cough medication sent in, that she previously tolerated Continue inhaler as needed

## 2012-06-02 NOTE — Assessment & Plan Note (Signed)
Uncontrolled however ill currently recheck at next visit

## 2012-06-04 ENCOUNTER — Inpatient Hospital Stay (HOSPITAL_COMMUNITY): Admission: RE | Admit: 2012-06-04 | Payer: Medicare Other | Source: Ambulatory Visit | Admitting: Physical Therapy

## 2012-06-08 ENCOUNTER — Ambulatory Visit (HOSPITAL_COMMUNITY)
Admission: RE | Admit: 2012-06-08 | Discharge: 2012-06-08 | Disposition: A | Discharge status: Home / Self Care | Payer: Medicare Other | Source: Ambulatory Visit | Attending: Orthopedic Surgery | Admitting: Orthopedic Surgery

## 2012-06-08 DIAGNOSIS — M545 Low back pain, unspecified: Secondary | ICD-10-CM | POA: Insufficient documentation

## 2012-06-08 DIAGNOSIS — IMO0001 Reserved for inherently not codable concepts without codable children: Secondary | ICD-10-CM | POA: Insufficient documentation

## 2012-06-08 DIAGNOSIS — M25559 Pain in unspecified hip: Secondary | ICD-10-CM | POA: Insufficient documentation

## 2012-06-08 DIAGNOSIS — R269 Unspecified abnormalities of gait and mobility: Secondary | ICD-10-CM | POA: Insufficient documentation

## 2012-06-08 DIAGNOSIS — M79609 Pain in unspecified limb: Secondary | ICD-10-CM | POA: Insufficient documentation

## 2012-06-08 NOTE — Progress Notes (Signed)
Physical Therapy Treatment Patient Details  Name: Dawn Church MRN: [id number] Date of Birth: [birth year]  Today's Date: 06/08/2012 Time: 1020-1058 PT Time Calculation (min): 38 min  Visit#: 6  of 12   Re-eval: 06/17/12 Assessment Diagnosis: B hip discomfort and Lumbar DDD Next MD Visit: Dr. Romeo Apple Charges:  therex 18', ultrasound 8', massage 10'  Authorization: Medicare  Authorization Visit#: 6  of 10    Subjective: Symptoms/Limitations Symptoms: Pt. states she can tell an overall improvement.  States her R hip continues to stay stiff and sore and is unable to give a pain rating.  States she missed last week due to bronchitis.   Exercise/Treatments Standing Heel Raises: 10 reps;Limitations Heel Raises Limitations: toeraises 10 reps Functional Squats: 10 reps Prone  Single Arm Raise: 10 reps Straight Leg Raise: 10 reps Opposite Arm/Leg Raise: 10 reps;5 seconds;Right arm/Left leg;Left arm/Right leg    Modalities Modalities: Ultrasound Manual Therapy Manual Therapy: Massage Massage: STM to R buttocks and hips to decrease pain and spasms x 10' following [place] in prone Ultrasound Ultrasound Location: R buttock Ultrasound Parameters: 1.2w/cm2 with 1 MHz X 8' prone lying Ultrasound Goals: Pain  Physical Therapy Assessment and Plan PT Assessment and Plan Clinical Impression Statement: No MET needed today.  Added [place] in prone prior to STM to heat and relax mm.  Pt. able to complete prone exercises, continues to require tactile cues for stabilization/control.  Pt. reported overall improvement at end of session. PT Plan: Continue with core stabalization exercises and flexibility. Manual techniques, modalities to decrease pain (MET, SCS, STM); Re-eval next week.     Problem List Patient Active Problem List  Diagnosis  . Essential hypertension, benign  . Hip pain, bilateral  . Hyperlipidemia  . H/O: stroke  . Iron deficiency anemia  . Lower back pain  . Hip pain  .  Abnormality of gait  . Bronchitis    PT - End of Session Activity Tolerance: Patient tolerated treatment well General Behavior During Session: [facility] for tasks performed Cognition: [facility] for tasks performed   Lurena Nida, PTA/CLT 06/08/2012, 11:04 AM

## 2012-06-09 ENCOUNTER — Ambulatory Visit (HOSPITAL_COMMUNITY)
Admission: RE | Admit: 2012-06-09 | Discharge: 2012-06-09 | Disposition: A | Discharge status: Home / Self Care | Payer: Medicare Other | Source: Ambulatory Visit | Attending: Orthopedic Surgery | Admitting: Orthopedic Surgery

## 2012-06-09 NOTE — Progress Notes (Signed)
Physical Therapy Treatment Patient Details  Name: Dawn Church MRN: [id number] Date of Birth: [birth year]  Today's Date: 06/09/2012 Time: 1018-1100 PT Time Calculation (min): 42 min  Visit#: 7  of 12   Re-eval: 06/17/12 Charges: MMT x 1 [place] x 8' Therex x 10' Manual x 8'  Authorization: Medicare  Authorization Visit#: 7  of 10    Subjective: Symptoms/Limitations Symptoms: Pt states she is not having any real pain, only soreness in her R hip. Pain Assessment Currently in Pain?: No/denies  Objective: SLS L:12" R:10"  Assessment RLE Strength Right Hip Flexion: 4/5 (was 3+/5) Right Hip Extension:  (4+/5 was 3+/5) Right Hip ABduction:  (4+/5 was 4/5) Right Knee Flexion:  (4+/5 was 3+/5) Right Knee Extension: 5/5 (was 4/5)  LLE Strength Left Hip Flexion:  (4+/5 was 4/5) Left Hip Extension:  (4+/5 was 3+/5) Left Hip ABduction:  (4+/5 was 3+/5) Left Knee Flexion:  (4+/5 was 4/5) Left Knee Extension: 5/5 (was 4/5) Exercise/Treatments Standing Heel Raises: 10 reps;Limitations Heel Raises Limitations: toeraises 10 reps Functional Squats: 10 reps  Modalities Modalities: Ultrasound Manual Therapy Manual Therapy: Massage Massage: STM to R gluteal region to decrease pain and spasms x 10' following [place] in prone  Ultrasound Ultrasound Location: R gluteal region Ultrasound Parameters: 1.5 w/cm2 with 1 MHz X 8' prone Ultrasound Goals: Pain  Physical Therapy Assessment and Plan PT Assessment and Plan Clinical Impression Statement: Pt is progressing well. Pt responds well too [place] and massage. Pt's LE strength has improved and overall pain has decreased. Pt reports decreased soreness at end of session.  PT Plan: Continue with core stabalization exercises and flexibility. Manual techniques, modalities to decrease pain (MET, SCS, STM); Re-eval next week.    Goals Home Exercise Program: Independently PT Short Term Goals: 3 weeks PT Short Term Goal 1: Pt will report decreased  discomfort to her B hip region and present with minimal fascial restrictions to gluteal region. : Progressing toward goal PT Short Term Goal 2: Pt will improve her core coordination and independently demonstrate appropriate multifidus, TrA and PF contraction.: Progressing toward goal PT Short Term Goal 3: Pt will improve her LE strength by 1 muscle grade: Progressing toward goal PT Long Term Goals: (6 weeks) PT Long Term Goal 1: Pt will present without fascial restrcitions to gluteal region in order to report discomfort less than 1/10 for 75% of her day. : Progressing toward goal PT Long Term Goal 2: Pt will improve her core strength and coordination to [facility] in order to maintain appropriate posture indepedent of cueing for an entire therapy regime. : Progressing toward goal Long Term Goal 3: Pt will be educated on proper posture and body mechanics to decrease risk of further injury.  Progressing toward goal Long Term Goal 4: Pt will improve her balance and demonstrate R and L SLS x10 sec on solid surface to decrease risk of falls. : Met (L:12" R:10") PT Long Term Goal 5: Pt will improve her ODI to less than 20% for improved percieved functional ability.: Progressing toward goal  Problem List Patient Active Problem List  Diagnosis  . Essential hypertension, benign  . Hip pain, bilateral  . Hyperlipidemia  . H/O: stroke  . Iron deficiency anemia  . Lower back pain  . Hip pain  . Abnormality of gait  . Bronchitis    PT - End of Session Activity Tolerance: Patient tolerated treatment well General Behavior During Session: [facility] for tasks performed Cognition: [facility] for tasks performed  Seth Bake,  PTA 06/09/2012, 11:37 AM

## 2012-06-10 ENCOUNTER — Ambulatory Visit (INDEPENDENT_AMBULATORY_CARE_PROVIDER_SITE_OTHER): Payer: Medicare Other | Admitting: Orthopedic Surgery

## 2012-06-10 ENCOUNTER — Encounter: Payer: Self-pay | Admitting: Orthopedic Surgery

## 2012-06-10 VITALS — BP 120/74 | Ht 63.0 in | Wt 166.0 lb

## 2012-06-10 DIAGNOSIS — M25551 Pain in right hip: Secondary | ICD-10-CM

## 2012-06-10 DIAGNOSIS — M25559 Pain in unspecified hip: Secondary | ICD-10-CM

## 2012-06-10 NOTE — Patient Instructions (Addendum)
Finish physical therapy

## 2012-06-10 NOTE — Progress Notes (Signed)
Patient ID: Dawn Church, female   DOB: 03/28/1928, 78 y.o.   MRN: [id number] Chief Complaint  Patient presents with  . Follow-up    recheck lower back and hip pain    Improved with therapy   Today right hip is sore   ROM is normal   Finish PT   F/u as needed

## 2012-06-11 ENCOUNTER — Ambulatory Visit (HOSPITAL_COMMUNITY)
Admission: RE | Admit: 2012-06-11 | Discharge: 2012-06-11 | Disposition: A | Discharge status: Home / Self Care | Payer: Medicare Other | Source: Ambulatory Visit | Attending: Orthopedic Surgery | Admitting: Orthopedic Surgery

## 2012-06-11 NOTE — Progress Notes (Signed)
Physical Therapy Treatment Patient Details  Name: Dawn Church MRN: [id number] Date of Birth: 04-24-1928  Today's Date: 06/11/2012 Time: 1020-1100 PT Time Calculation (min): 40 min  Visit#: 8  of 12   Re-eval: 06/17/12  Authorization: Medicare  Authorization Visit#: 8  of 10   Therex x 30' [place] x 8'  Subjective: Symptoms/Limitations Symptoms: Pt states that she is getting better but she coninues to have soreness in her hip. Pain Assessment Currently in Pain?: No/denies   Exercise/Treatments Standing Heel Raises: 15 reps;Limitations Heel Raises Limitations: toe raises 15 reps Functional Squats: 10 reps Functional Squats Limitations: max tactile cueing for proper form Supine Ab Set: 10 reps AB Set Limitations: 10x10"  Bent Knee Raise: 10 reps;5 seconds Bridge: 15 reps;5 seconds  Modalities Modalities: Ultrasound Ultrasound Ultrasound Location: R gluteal region  Ultrasound Parameters: 1.5 w/cm2 with 1 MHz X 8' prone  Ultrasound Goals: Pain  Physical Therapy Assessment and Plan PT Assessment and Plan Clinical Impression Statement: Pt completes therex well with min/mod multimodal cueing for proper technique. [place] continued this session to decrease soreness. Held manual techniques to assess benefits of [place]. PT Plan: Continue with core stabalization exercises and flexibility. Manual techniques, modalities to decrease pain (MET, SCS, STM); Re-eval next week.     Problem List Patient Active Problem List  Diagnosis  . Essential hypertension, benign  . Hip pain, bilateral  . Hyperlipidemia  . H/O: stroke  . Iron deficiency anemia  . Lower back pain  . Hip pain  . Abnormality of gait  . Bronchitis    PT - End of Session Activity Tolerance: Patient tolerated treatment well General Behavior During Session: [facility] for tasks performed Cognition: [facility] for tasks performed  Seth Bake, PTA 06/11/2012, 1:05 PM

## 2012-06-14 ENCOUNTER — Ambulatory Visit (HOSPITAL_COMMUNITY)
Admission: RE | Admit: 2012-06-14 | Discharge: 2012-06-14 | Disposition: A | Discharge status: Home / Self Care | Payer: Medicare Other | Source: Ambulatory Visit | Attending: Orthopedic Surgery | Admitting: Orthopedic Surgery

## 2012-06-14 NOTE — Progress Notes (Signed)
Physical Therapy Treatment Patient Details  Name: Dawn Church MRN: [id number] Date of Birth: Feb 15, 1928  Today's Date: 06/14/2012 Time: 1352-1430 PT Time Calculation (min): 38 min Visit#: 9  of 12   Re-eval: 06/17/12 Authorization: Medicare  Authorization Visit#: 9  of 10   Charges:  therex 14', traction 15'  Subjective: Symptoms/Limitations Symptoms: Pt. reports she is still having radiating pain into B hips R>L. Pain Assessment Currently in Pain?: Yes Pain Score:   5 Pain Location: Hip   Exercise/Treatments Standing Heel Raises: 15 reps;Limitations Heel Raises Limitations: toe raises 15 reps Functional Squats: 10 reps Functional Squats Limitations: max tactile cueing for proper form    Modalities Modalities: Traction Traction Type of Traction: Lumbar Min (lbs): n/a Max (lbs): 60 Hold Time: static Rest Time: n/a Time: 15  Physical Therapy Assessment and Plan PT Assessment and Plan Clinical Impression Statement: trial of lumbar traction today (OK per Annett Fabian, PT) to see if helps to decreased radiating symptoms.  Pt. continue to require tactile cues with squats.   PT Plan: Continue with core stabalization exercises and flexibility. Assess benefit of traction; G-code update needed next visit.  Re-eval for MD appt.   Problem List Patient Active Problem List  Diagnosis  . Essential hypertension, benign  . Hip pain, bilateral  . Hyperlipidemia  . H/O: stroke  . Iron deficiency anemia  . Lower back pain  . Hip pain  . Abnormality of gait  . Bronchitis    PT - End of Session Activity Tolerance: Patient tolerated treatment well General Behavior During Session: [facility] for tasks performed Cognition: [facility] for tasks performed   Lurena Nida, PTA/CLT 06/14/2012, 2:16 PM

## 2012-06-15 ENCOUNTER — Ambulatory Visit (HOSPITAL_COMMUNITY)
Admission: RE | Admit: 2012-06-15 | Discharge: 2012-06-15 | Disposition: A | Discharge status: Home / Self Care | Payer: Medicare Other | Source: Ambulatory Visit | Attending: Orthopedic Surgery | Admitting: Orthopedic Surgery

## 2012-06-15 ENCOUNTER — Encounter: Payer: Self-pay | Admitting: Family Medicine

## 2012-06-15 ENCOUNTER — Ambulatory Visit (INDEPENDENT_AMBULATORY_CARE_PROVIDER_SITE_OTHER): Payer: Medicare Other | Admitting: Family Medicine

## 2012-06-15 VITALS — BP 150/78 | HR 78 | Resp 15 | Ht 63.0 in | Wt 165.8 lb

## 2012-06-15 DIAGNOSIS — E876 Hypokalemia: Secondary | ICD-10-CM

## 2012-06-15 DIAGNOSIS — J40 Bronchitis, not specified as acute or chronic: Secondary | ICD-10-CM

## 2012-06-15 DIAGNOSIS — E785 Hyperlipidemia, unspecified: Secondary | ICD-10-CM

## 2012-06-15 DIAGNOSIS — Z23 Encounter for immunization: Secondary | ICD-10-CM

## 2012-06-15 DIAGNOSIS — I10 Essential (primary) hypertension: Secondary | ICD-10-CM

## 2012-06-15 MED ORDER — HYDROCODONE-HOMATROPINE 5-1.5 MG/5ML PO SYRP: 5.0000 mL | ORAL_SOLUTION | Freq: Four times a day (QID) | ORAL | Status: DC | PRN

## 2012-06-15 NOTE — Progress Notes (Signed)
Physical Therapy Re-Evaluation/G-code update  Patient Details  Name: Dawn Church MRN: [id number] Date of Birth: [birth year]  Today's Date: 06/15/2012 Time: 4098-1191 PT Time Calculation (min): 45 min Charges: 20' Self Care, 15' manual, 10' NMR  Visit#: 10  of 12   Re-eval: 06/17/12 Assessment Diagnosis: B hip discomfort and Lumbar DDD Next MD Visit: Dr. Romeo Apple  Authorization: Medicare  Authorization Time Period:    Authorization Visit#: 10  of 20    Subjective Symptoms/Limitations Symptoms: Pt reports that her back and hips are better overall. She reports she continues to have some pain to her R side and her L side is much better.  She states she has been having other health problems causing her increased coughing which has caused some increased pain.  Pain Assessment Currently in Pain?: Yes Pain Score:   4 Pain Location: Hip Pain Orientation: Right Pain Relieving Factors: She reports that she is not taking pain medication for the pain since it is more of a soreness   Sensation/Coordination/Flexibility/Functional Tests  ODI: 50% (was 28%) may have increased due to pt receiving help to see answers clearly.   Assessment LLE Strength Left Hip Flexion: 4/5 Lumbar AROM Lumbar Flexion: decreased 5% w/gower sign Lumbar Extension: decreased 90% with pain  Lumbar - Right Side Bend: Decreased 10% Lumbar - Left Side Bend: decreased 10% w/increased pain  Exercise/Treatments Mobility/Balance  Static Standing Balance Single Leg Stance - Right Leg: 10  (was 0) Single Leg Stance - Left Leg: 10  (was 4)   Supine  TrA contraciton using manual facilitation for proper controal  Manual Therapy PRONE: SCS to R gluteal region to decrease pain (using hip IR) w/75% decrease in muscle spasms w/STM after to improve circulation  SUPINE: SCS to R anterior hip flexor region to decrease pain and spasms, reduced by 50% and reports of decreased pain.  STM to follow.     Physical Therapy  Assessment and Plan PT Assessment and Plan Clinical Impression Statement: G-code complete.  had significant decrease in pain at end of session today after manual techniques.  Frequency: 2x/week  Duration: 2 weeks PT Plan: Continue to complete manual therapy to decrease pain     Goals Home Exercise Program Pt will Perform Home Exercise Program: Independently PT Goal: Perform Home Exercise Program - Progress: Progressing toward goal PT Short Term Goals Time to Complete Short Term Goals: 3 weeks PT Short Term Goal 1: Pt will report decreased discomfort to her B hip region and present with minimal fascial restrictions to gluteal region.  PT Short Term Goal 2: Pt will improve her core coordination and independently demonstrate appropriate multifidus, TrA and PF contraction. PT Short Term Goal 3: Pt will improve her LE strength by 1 muscle grade PT Long Term Goals Time to Complete Long Term Goals: Other (comment) (6 weeks) PT Long Term Goal 1: Pt will present without fascial restrcitions to gluteal region in order to report discomfort less than 1/10 for 75% of her day.  PT Long Term Goal 1 - Progress: Progressing toward goal (Average pain is 'better than it was") PT Long Term Goal 2: Pt will improve her core strength and coordination to [facility] in order to maintain appropriate posture indepedent of cueing for an entire therapy regime.  Long Term Goal 3: Pt will be educated on proper posture and body mechanics to decrease risk of further injury.  Long Term Goal 3 Progress: Met Long Term Goal 4: Pt will improve her balance and demonstrate R and  L SLS x10 sec on solid surface to decrease risk of falls.  Long Term Goal 4 Progress: Met (10 sec each) PT Long Term Goal 5: Pt will improve her ODI to less than 20% for improved percieved functional ability.   Problem List Patient Active Problem List  Diagnosis  . Essential hypertension, benign  . Hip pain, bilateral  . Hyperlipidemia  . H/O: stroke  .  Iron deficiency anemia  . Lower back pain  . Hip pain  . Abnormality of gait  . Bronchitis    PT - End of Session Activity Tolerance: Patient tolerated treatment well General Behavior During Session: [facility] for tasks performed Cognition: [facility] for tasks performed  GP Functional Assessment Tool Used: ODI: 50% and self report of pt states she feels better Functional Limitation: Self care Self Care Current Status (W4132): At least 40 percent but less than 60 percent impaired, limited or restricted Self Care Goal Status (G4010): At least 1 percent but less than 20 percent impaired, limited or restricted  Sidharth Leverette 06/15/2012, 10:25 AM  Physician Documentation Your signature is required to indicate approval of the treatment plan as stated above.  Please sign and either send electronically or make a copy of this report for your files and return this physician signed original.   Please mark one 1.__approve of plan  2. ___approve of plan with the following conditions.   ______________________________                                                          _____________________ Physician Signature                                                                                                             Date

## 2012-06-15 NOTE — Patient Instructions (Addendum)
Continue current blood pressure medication  Take blood pressure three times a week and record  Call if your blood pressure stay above 140/90  Get the blood drawn today  I will refill the cough syrup F/U 3 months

## 2012-06-16 ENCOUNTER — Ambulatory Visit (HOSPITAL_COMMUNITY)
Admission: RE | Admit: 2012-06-16 | Discharge: 2012-06-16 | Disposition: A | Discharge status: Home / Self Care | Payer: Medicare Other | Source: Ambulatory Visit | Attending: Orthopedic Surgery | Admitting: Orthopedic Surgery

## 2012-06-16 DIAGNOSIS — Z23 Encounter for immunization: Secondary | ICD-10-CM

## 2012-06-16 DIAGNOSIS — E876 Hypokalemia: Secondary | ICD-10-CM | POA: Insufficient documentation

## 2012-06-16 LAB — BASIC METABOLIC PANEL
BUN: 17 mg/dL (ref 6–23)
CO2: 33 mEq/L — ABNORMAL HIGH (ref 19–32)
Calcium: 9.8 mg/dL (ref 8.4–10.5)
Creat: 1.07 mg/dL (ref 0.50–1.10)
Glucose, Bld: 97 mg/dL (ref 70–99)
Sodium: 140 mEq/L (ref 135–145)

## 2012-06-16 NOTE — Assessment & Plan Note (Signed)
Repeat potassium improved

## 2012-06-16 NOTE — Progress Notes (Signed)
Physical Therapy Treatment Patient Details  Name: Dawn Church MRN: [id number] Date of Birth: September 28, 1927  Today's Date: 06/16/2012 Time: 1020-1102 PT Time Calculation (min): 42 min Visit#: 11  of 14   Re-eval: 06/17/12 Authorization: Medicare  Authorization Visit#: 11  of 14   Charges:  therex 16', manual 10', traction 15'  Subjective: Symptoms/Limitations Symptoms: Pt. states she is unsure what/if anything is helping.  States her pain is 3-4/10 today.  States it is mostly into her R hip. Pain Assessment Currently in Pain?: Yes Pain Score:   4 Pain Location: Hip Pain Orientation: Right   Exercise/Treatments Prone  Single Arm Raise: 10 reps Straight Leg Raise: 10 reps Opposite Arm/Leg Raise: 10 reps Other Prone Lumbar Exercises: heelsqeeze 10x5"   Modalities Modalities: Traction Manual Therapy Manual Therapy: Massage Massage: STM to B gluteal region to decrease pain and spasms x 8' in prone Traction Type of Traction: Lumbar Min (lbs): n/a Max (lbs): 75 Hold Time: static Rest Time: n/a Time: 15  Physical Therapy Assessment and Plan PT Assessment and Plan Clinical Impression Statement: Significant pain reduction at end of session today with no antalgia noted.  Continues to require cues to stabilize with prone exercises.  Increased traction weight to 75# max with good results (pt. weighs 165#). PT Plan: Continue prone stab exercises, modalities and manual therapy to decrease pain.  Continue X 3 more visits per PT.   Problem List Patient Active Problem List  Diagnosis  . Essential hypertension, benign  . Hip pain, bilateral  . Hyperlipidemia  . H/O: stroke  . Iron deficiency anemia  . Lower back pain  . Hip pain  . Abnormality of gait  . Bronchitis    PT - End of Session Activity Tolerance: Patient tolerated treatment well General Behavior During Session: [facility] for tasks performed Cognition: [facility] for tasks performed   Lurena Nida,  PTA/CLT 06/16/2012, 11:24 AM

## 2012-06-16 NOTE — Assessment & Plan Note (Signed)
Uncontrolled BP, pt reluctant to change meds, her home readings look much better, will have her take BP at home given parameters to call me if elevated, would increase ramipril to 5mg  if needed

## 2012-06-16 NOTE — Assessment & Plan Note (Addendum)
Much improved now with post bronchitis cough, refilled cough medicine will monitor Flu shot given as no fever

## 2012-06-16 NOTE — Progress Notes (Signed)
  Subjective:    Patient ID: Dawn Church, female    DOB: Sep 10, 1927, 78 y.o.   MRN: [id number]  HPI Pt here to f/u previous bronchitis and chronic medical problems No new concerns Cough still present but much improved, denies SOB, fever, chest pain HTN- she has been under some stress with the family a grand-daughter gave birth to twins too early. Her BP at home ranges 130-140/ 76-80, taken at home and senior center Flu shot needed  Review of Systems  GEN- denies fatigue, fever, weight loss,weakness, recent illness HEENT- denies eye drainage, change in vision, nasal discharge, CVS- denies chest pain, palpitations RESP- denies SOB, +cough, wheeze ABD- denies N/V, change in stools, abd pain MSK- denies joint pain, muscle aches, injury Neuro- denies headache, dizziness, syncope, seizure activity      Objective:   Physical Exam GEN- NAD, alert and oriented x3, fatigued appearing HEENT- PERRL, EOMI, non injected sclera, pink conjunctiva, MMM, oropharynx clear, Neck- Supple,  CVS- RRR, no murmur RESP-CTAB ABD-NABS,soft,NT,ND EXT- No edema Pulses- Radial, DP- 2+        Assessment & Plan:

## 2012-06-16 NOTE — Assessment & Plan Note (Signed)
Last set of lipids not received yet, no current meds

## 2012-06-17 ENCOUNTER — Telehealth: Payer: Self-pay | Admitting: Family Medicine

## 2012-06-18 ENCOUNTER — Telehealth: Payer: Self-pay | Admitting: Family Medicine

## 2012-06-18 MED ORDER — ERYTHROMYCIN BASE 500 MG PO TABS: 500.0000 mg | ORAL_TABLET | Freq: Four times a day (QID) | ORAL | Status: AC

## 2012-06-18 NOTE — Telephone Encounter (Signed)
Previous message sent to doctor.  

## 2012-06-18 NOTE — Telephone Encounter (Signed)
Patient aware.

## 2012-06-18 NOTE — Telephone Encounter (Signed)
She needs to take erythromycin four times a day for 14 days

## 2012-06-21 ENCOUNTER — Telehealth: Payer: Self-pay

## 2012-06-21 MED ORDER — AZITHROMYCIN 250 MG PO TABS: ORAL_TABLET | ORAL | Status: AC

## 2012-06-21 NOTE — Telephone Encounter (Signed)
Please let pt know, if she has recurrent cough, fever after taking zpak she needs to come in to be seen with her pertussis exposure

## 2012-06-22 ENCOUNTER — Ambulatory Visit (HOSPITAL_COMMUNITY): Payer: Medicare Other | Admitting: Physical Therapy

## 2012-06-22 NOTE — Telephone Encounter (Signed)
Noted  

## 2012-06-24 ENCOUNTER — Ambulatory Visit (HOSPITAL_COMMUNITY): Payer: Medicare Other | Admitting: Physical Therapy

## 2012-06-29 ENCOUNTER — Ambulatory Visit (HOSPITAL_COMMUNITY)
Admission: RE | Admit: 2012-06-29 | Discharge: 2012-06-29 | Disposition: A | Discharge status: Home / Self Care | Payer: Medicare Other | Source: Ambulatory Visit | Attending: Family Medicine | Admitting: Family Medicine

## 2012-06-29 NOTE — Progress Notes (Signed)
Physical Therapy Treatment Patient Details  Name: JOSELYNNE KILLAM MRN: [id number] Date of Birth: 10-14-1927  Today's Date: 06/29/2012 Time: 1020-1059 PT Time Calculation (min): 39 min Charges: 30' TE, 9' Manual  Visit#: 12  of 14   Re-eval: 06/17/12    Authorization: Medicare  Authorization Time Period:    Authorization Visit#: 12  of 14    Subjective: Symptoms/Limitations Symptoms: I think that therapy is helping, since I was off last week sick, I can feel I am really stiff in my R hip.  I have not been able to do my exercises as much last week since I was sick.  Pain Assessment Currently in Pain?: Yes Pain Score:   4 Pain Location: Hip Pain Orientation: Right  Precautions/Restrictions     Exercise/Treatments Stretches Single Knee to Chest Stretch: 3 reps;30 seconds Quad Stretch: 3 reps;30 seconds Supine Ab Set: 10 reps AB Set Limitations: 10x10" w/manual faciliation  Straight Leg Raise: 10 reps;Limitations (BLE) Straight Leg Raises Limitations: w/TrA contraction Isometric Hip Flexion: 5 reps;5 seconds (R LE only) Sidelying Clam: Limitations Clam Limitations: 8 reps 10 sec holds w/manual faciliation for gluteus medius activation, BLE Hip Abduction: 10 reps (BLE) Prone  Single Arm Raise: Right;Left;10 reps Straight Leg Raise: 10 reps (BLE) Opposite Arm/Leg Raise: Right arm/Left leg;Left arm/Right leg;10 reps Other Prone Lumbar Exercises: heelsqeeze 10x5"  Manual Therapy Manual Therapy: Other (comment) Other Manual Therapy: MET to correct L anterior/R posterior pelvic shift and R pelvic outflare.  Left long leg distraction w/Grade II-III mobilizations  Physical Therapy Assessment and Plan PT Assessment and Plan Clinical Impression Statement: Pt has improved coordination to core muscles, but requires mos-max cueing for gluteus medius activation.  Continues to have decreased strength overall (especially to RLE).  After treatment reports decreased pain to her R hip.    PT Plan: Continue to improve LE strength. F/U on manual techniques.     Goals    Problem List Patient Active Problem List  Diagnosis  . Essential hypertension, benign  . Hip pain, bilateral  . Hyperlipidemia  . H/O: stroke  . Iron deficiency anemia  . Lower back pain  . Hip pain  . Abnormality of gait  . Bronchitis  . Hypokalemia    PT - End of Session Activity Tolerance: Patient tolerated treatment well General Behavior During Session: [facility] for tasks performed Cognition: [facility] for tasks performed PT Plan of Care PT Patient Instructions: Discussed OA and what questions to discuss conservative options with her doctor  Consulted and Agree with Plan of Care: Patient  GP    Kadija Cruzen 06/29/2012, 11:04 AM

## 2012-06-30 ENCOUNTER — Ambulatory Visit (HOSPITAL_COMMUNITY)
Admission: RE | Admit: 2012-06-30 | Discharge: 2012-06-30 | Disposition: A | Discharge status: Home / Self Care | Payer: Medicare Other | Source: Ambulatory Visit | Attending: Family Medicine | Admitting: Family Medicine

## 2012-06-30 NOTE — Progress Notes (Signed)
Physical Therapy Treatment Patient Details  Name: Dawn Church MRN: [id number] Date of Birth: 1927-10-31  Today's Date: 06/30/2012 Time: 1105-1220 PT Time Calculation (min): 75 min  Visit#: 13  of 14   Re-eval: 06/17/12 (Re-eval complete 06/15/2012)  Charge: therex 38', manual 12', traction 17'  Authorization: Medicare  Authorization Visit#: 13  of 14    Subjective: Symptoms/Limitations Symptoms: The manual felt good last session but pain has returned R hip.   Pain Assessment Currently in Pain?: Yes Pain Score:   4 Pain Location: Hip Pain Orientation: Right  Objective:   Exercise/Treatments Stretches Single Knee to Chest Stretch: 3 reps;30 seconds Hip Flexor Stretch: 3 reps;30 seconds;Limitations Hip Flexor Stretch Limitations: for R hip Aerobic Tread Mill: 1.5 x 6' following MET Supine Ab Set: 10 reps AB Set Limitations: 10x10" w/manual faciliation  Bridge: 15 reps;5 seconds;Limitations Bridge Limitations: with cueing to activate glut before hamstrings Straight Leg Raise: 10 reps;Limitations Straight Leg Raises Limitations: w/TrA contraction Isometric Hip Flexion: 10 reps;5 seconds  Modalities Modalities: Traction Manual Therapy Other Manual Therapy: MET to correct L anterior rotation and R pelvic outflare x 12' with 6' gait training on TM following Traction Type of Traction: Lumbar Max (lbs): 75 Hold Time: static Rest Time: n/a Time: 17'  Physical Therapy Assessment and Plan PT Assessment and Plan Clinical Impression Statement: Pt with improved coordination to core musculature but does require manual facilitation for proper muscle activation.  Pt with improved gait mechanics following manual MET for pelvic alignement.  Pt reported pain reduced at end of session following manual and lumbar traction. PT Plan: Re-assess next session.  Continue to improve LE strength (especially R LE) and pain reduction.    Goals    Problem List Patient Active Problem  List  Diagnosis  . Essential hypertension, benign  . Hip pain, bilateral  . Hyperlipidemia  . H/O: stroke  . Iron deficiency anemia  . Lower back pain  . Hip pain  . Abnormality of gait  . Bronchitis  . Hypokalemia    PT - End of Session Activity Tolerance: Patient tolerated treatment well General Behavior During Session: [facility] for tasks performed Cognition: [facility] for tasks performed  GP    Juel Burrow 06/30/2012, 12:06 PM

## 2012-07-06 ENCOUNTER — Inpatient Hospital Stay (HOSPITAL_COMMUNITY)
Admission: RE | Admit: 2012-07-06 | Discharge: 2012-07-06 | DRG: 946 | Disposition: A | Discharge status: Home / Self Care | Payer: Medicare Other | Source: Ambulatory Visit | Attending: Family Medicine | Admitting: Family Medicine

## 2012-07-06 DIAGNOSIS — IMO0001 Reserved for inherently not codable concepts without codable children: Secondary | ICD-10-CM

## 2012-07-06 DIAGNOSIS — M25559 Pain in unspecified hip: Secondary | ICD-10-CM | POA: Diagnosis present

## 2012-07-06 DIAGNOSIS — M51379 Other intervertebral disc degeneration, lumbosacral region without mention of lumbar back pain or lower extremity pain: Secondary | ICD-10-CM | POA: Diagnosis present

## 2012-07-06 DIAGNOSIS — M5137 Other intervertebral disc degeneration, lumbosacral region: Secondary | ICD-10-CM | POA: Diagnosis present

## 2012-07-06 NOTE — Evaluation (Signed)
Physical Therapy Discharge Note and G-code  Patient Details  Name: Dawn Church MRN: [id number] Date of Birth: 12/05/1927  Today's Date: 07/06/2012 Time: 8657-8469 PT Time Calculation (min): 41 min Charges: 1 ROM, 1 MMT, 30' TE, 10' Self Care Visit#: 14  of 14   Re-eval: 06/17/12 (Re-eval complete 06/15/2012) Assessment Diagnosis: B hip discomfort and Lumbar DDD Next MD Visit: Dr. Romeo Apple - 07/20/12  Authorization: Medicare  Authorization Time Period:    Authorization Visit#: 14  of 14    Subjective Symptoms/Limitations Symptoms: She reports that her pain remains about a 3-4/10.  Reports moderate compliance with certain activities, especially with core strength.  Pain Assessment Pain Score:   3 Pain Location: Hip Pain Orientation: Right  Sensation/Coordination/Flexibility/Functional Tests Functional Tests Functional Tests: ODI: 32% (was 50% impairement)  Assessment RLE Strength Right Hip Flexion: 4/5 (was 4/5) Right Hip Extension:  (4+/5, was +/5) Right Hip ABduction:  (4+/5, was 4+/5) Right Knee Flexion:  (4+/5, was 4+/5) Right Knee Extension: 5/5 (was 5/5) LLE Strength Left Hip Flexion:  (4+/5) Left Hip Extension: 5/5 (was 4+/5) Left Hip ABduction: 5/5 (was 4+/5) Left Knee Flexion: 5/5 (was 4+/5) Left Knee Extension: 5/5 (was 5/5) Lumbar AROM Lumbar Flexion: WNL (decreased 5% w/gower sign) Lumbar Extension: decreased 20% (decreased 90% with pain ) Lumbar - Right Side Bend: WNL - no pain (Decreased 10%) Lumbar - Left Side Bend: WNL - no pain (decreased 10% w/increased pain) Palpation Palpation: mod TP to R gluteal region w/ P&T to R greater trochanter (Was: significant TP with fascial restrictions to B gluteal)  Mobility/Balance  Ambulation/Gait Gait Pattern: Within Functional Limits Static Standing Balance Single Leg Stance - Right Leg: 12  (was 10) Single Leg Stance - Left Leg: 12  (was 10)   Exercise/Treatments Stretches Hip Flexor Stretch: 3  reps;30 seconds;Limitations Hip Flexor Stretch Limitations: standing Piriformis Stretch: 3 reps;30 seconds (seated, figure 4 position) Standing Heel Raises: 20 reps Heel Raises Limitations: toe raises 20 reps Functional Squats: 20 reps Other Standing Lumbar Exercises: Hip Abd x15 BLE Other Standing Lumbar Exercises: Hip Extension 15 reps BLE Seated Other Seated Lumbar Exercises: Hip flexion isometric  Supine Straight Leg Raise: 15 reps (RLE) Sidelying Hip Abduction: 15 reps (BLE)  Physical Therapy Assessment and Plan PT Assessment and Plan Clinical Impression Statement: Ms. Yeaton has attended 14 OP PT visits to address B hip pain.  Currently she has met 1/4 STG and 3/5 LTG.  She currently has pain 3-4/10 to her R hip only.  She has improved her L LE strength significantly, however continues to have deficits to her RLE.  She would have temporary relief with manual techniques and modalities.  Discussed w/pt to discuss with MD about OTC medication or conservative treatment to decrease overall pain.  At this time pt is D/C from PT w/HEP.  PT Plan: D/C and f/u w/MD on 07/20/12    Goals Pt will Perform Home Exercise Program: Independently: Progressing toward goal (3-4 exercises to improve flexibility, discussed strength TE) PT Short Term Goals: 3 weeks PT Short Term Goal 1: Pt will report decreased discomfort to her B hip region and present with minimal fascial restrictions to gluteal region.  PT Short Term Goal 1 - Progress: Partly met (L hip w/o TP or restrictions, R with minimal) PT Short Term Goal 2: Pt will improve her core coordination and independently demonstrate appropriate multifidus, TrA and PF contraction. PT Short Term Goal 2 - Progress: Met PT Short Term Goal 3: Pt will improve  her LE strength by 1 muscle grade PT Short Term Goal 3 - Progress: Progressing toward goal PT Long Term Goals (6 weeks) PT Long Term Goal 1: Pt will present without fascial restrcitions to gluteal  region in order to report discomfort less than 1/10 for 75% of her day.: Not met (3-4/10) PT Long Term Goal 2: Pt will improve her core strength and coordination to [facility] in order to maintain appropriate posture indepedent of cueing for an entire therapy regime. : Met (able to fill out paper work without cueing) Long Term Goal 3: Pt will be educated on proper posture and body mechanics to decrease risk of further injury.: Met Long Term Goal 4: Pt will improve her balance and demonstrate R and L SLS x10 sec on solid surface to decrease risk of falls. : Met (12 sec on L and R) PT Long Term Goal 5: Pt will improve her ODI to less than 20% for improved percieved functional ability. Not Met (32%)  Problem List Patient Active Problem List  Diagnosis  . Essential hypertension, benign  . Hip pain, bilateral  . Hyperlipidemia  . H/O: stroke  . Iron deficiency anemia  . Lower back pain  . Hip pain  . Abnormality of gait  . Bronchitis  . Hypokalemia    PT - End of Session Activity Tolerance: Patient tolerated treatment well General Behavior During Session: [facility] for tasks performed Cognition: [facility] for tasks performed PT Plan of Care PT Home Exercise Plan: updated with R LE strengthening.  PT Patient Instructions: Discussed ODI Consulted and Agree with Plan of Care: Patient  GP Functional Assessment Tool Used: ODI: 32% Functional Limitation: Self care Self Care Goal Status (J1914): At least 1 percent but less than 20 percent impaired, limited or restricted Self Care Discharge Status [phone]): At least 20 percent but less than 40 percent impaired, limited or restricted  Raliegh Scobie 07/06/2012, 11:57 AM  Physician Documentation Your signature is required to indicate approval of the treatment plan as stated above.  Please sign and either send electronically or make a copy of this report for your files and return this physician signed original.   Please mark one 1.__approve of plan  2. ___approve  of plan with the following conditions.   ______________________________                                                          _____________________ Physician Signature                                                                                                             Date

## 2012-07-07 ENCOUNTER — Ambulatory Visit (HOSPITAL_COMMUNITY): Payer: Medicare Other | Admitting: Physical Therapy

## 2012-07-12 ENCOUNTER — Other Ambulatory Visit (HOSPITAL_COMMUNITY): Payer: Self-pay | Admitting: Internal Medicine

## 2012-07-20 ENCOUNTER — Encounter: Payer: Self-pay | Admitting: Family Medicine

## 2012-07-20 ENCOUNTER — Ambulatory Visit (INDEPENDENT_AMBULATORY_CARE_PROVIDER_SITE_OTHER): Payer: Medicare Other | Admitting: Family Medicine

## 2012-07-20 VITALS — BP 154/76 | HR 72 | Resp 18 | Ht 63.0 in | Wt 163.1 lb

## 2012-07-20 DIAGNOSIS — I10 Essential (primary) hypertension: Secondary | ICD-10-CM

## 2012-07-20 DIAGNOSIS — F4321 Adjustment disorder with depressed mood: Secondary | ICD-10-CM

## 2012-07-20 DIAGNOSIS — F4320 Adjustment disorder, unspecified: Secondary | ICD-10-CM

## 2012-07-20 MED ORDER — PANTOPRAZOLE SODIUM 40 MG PO TBEC: 40.0000 mg | DELAYED_RELEASE_TABLET | Freq: Every day | ORAL | Status: DC

## 2012-07-20 MED ORDER — TRIAMTERENE-HCTZ 37.5-25 MG PO TABS: 1.0000 | ORAL_TABLET | Freq: Every day | ORAL | Status: DC

## 2012-07-20 MED ORDER — RAMIPRIL 5 MG PO CAPS: 5.0000 mg | ORAL_CAPSULE | Freq: Every day | ORAL | Status: DC

## 2012-07-20 MED ORDER — TRAMADOL-ACETAMINOPHEN 37.5-325 MG PO TABS: 1.0000 | ORAL_TABLET | Freq: Four times a day (QID) | ORAL | Status: DC | PRN

## 2012-07-20 NOTE — Patient Instructions (Addendum)
Increase ramipril to 5mg  daily You can take 2 of the 2.5mg  tablets until you run out Continue all other medication Change appointment to morning slot

## 2012-07-21 ENCOUNTER — Encounter: Payer: Self-pay | Admitting: Family Medicine

## 2012-07-21 DIAGNOSIS — F4321 Adjustment disorder with depressed mood: Secondary | ICD-10-CM | POA: Insufficient documentation

## 2012-07-21 NOTE — Assessment & Plan Note (Signed)
Acute grief, her children are supportive, she declines any medications for sleep etc. Uses faith to help her through difficult times.

## 2012-07-21 NOTE — Assessment & Plan Note (Signed)
Increase ramipril to 5 mg , continue Maxzide

## 2012-07-21 NOTE — Progress Notes (Signed)
  Subjective:    Patient ID: Dawn Church, female    DOB: 08-05-1928, 78 y.o.   MRN: [id number]  HPI  Pt here to f/u blood pressure, her wrist cuff at home was reading 180-200's systolic. She has been under some stress, her son is currently in the hospital with CVA in [place]. She denies CP, cough, HA, Abd pain, SOB  Review of Systems  GEN- denies fatigue, fever, weight loss,weakness, recent illness HEENT- denies eye drainage, change in vision, nasal discharge, CVS- denies chest pain, palpitations RESP- denies SOB, cough, wheeze ABD- denies N/V, change in stools, abd pain Neuro- denies headache, dizziness, syncope, seizure activity      Objective:   Physical Exam GEN- NAD, alert and oriented x3, well appearing CVS- RRR, no murmur RESP-CTAB EXT- No edema Pulses- Radial 2+ Psych- crying when discussing her son, not overly depressed or anxious,       Assessment & Plan:

## 2012-08-16 ENCOUNTER — Other Ambulatory Visit: Payer: Self-pay | Admitting: Orthopedic Surgery

## 2012-08-16 DIAGNOSIS — M25569 Pain in unspecified knee: Secondary | ICD-10-CM

## 2012-08-16 DIAGNOSIS — R52 Pain, unspecified: Secondary | ICD-10-CM

## 2012-08-16 MED ORDER — TRAMADOL-ACETAMINOPHEN 37.5-325 MG PO TABS: 1.0000 | ORAL_TABLET | Freq: Four times a day (QID) | ORAL | Status: DC | PRN

## 2012-09-14 ENCOUNTER — Encounter: Payer: Self-pay | Admitting: Family Medicine

## 2012-09-14 ENCOUNTER — Ambulatory Visit (INDEPENDENT_AMBULATORY_CARE_PROVIDER_SITE_OTHER): Payer: Medicare Other | Admitting: Family Medicine

## 2012-09-14 VITALS — BP 158/82 | HR 84 | Resp 18 | Ht 63.0 in | Wt 166.0 lb

## 2012-09-14 DIAGNOSIS — I10 Essential (primary) hypertension: Secondary | ICD-10-CM

## 2012-09-14 DIAGNOSIS — J069 Acute upper respiratory infection, unspecified: Secondary | ICD-10-CM

## 2012-09-14 NOTE — Patient Instructions (Addendum)
Continue current blood pressure medication Record your blood pressure at least 1 hour after taking medications Call if your cough does not improve  I will call in a few weeks on your blood pressures F/U 3 months

## 2012-09-14 NOTE — Progress Notes (Signed)
  Subjective:    Patient ID: Dawn Church, female    DOB: October 13, 1927, 79 y.o.   MRN: [id number]  HPI  Patient here to follow chronic medical problems. She's been tolerating her Ramipril. For the past couple of days she's had cough but denies any shortness of breath and wheezing fever or production. She's been trying to rest more and trying to the stress herself other she has a lot to deal with with her family members.   Review of Systems   GEN- denies fatigue, fever, weight loss,weakness, recent illness HEENT- denies eye drainage, change in vision, nasal discharge, CVS- denies chest pain, palpitations RESP- denies SOB, +cough, wheeze ABD- denies N/V, change in stools, abd pain GU- denies dysuria, hematuria, dribbling, incontinence MSK- denies joint pain, muscle aches, injury Neuro- denies headache, dizziness, syncope, seizure activity      Objective:   Physical Exam GEN- NAD, alert and oriented x3 HEENT- PERRL, EOMI, non injected sclera, pink conjunctiva, MMM, oropharynx clear Neck- Supple, CVS- RRR, no murmur RESP-CTAB EXT- No edema Pulses- Radial, DP- 2+        Assessment & Plan:

## 2012-09-15 ENCOUNTER — Encounter: Payer: Self-pay | Admitting: Family Medicine

## 2012-09-15 DIAGNOSIS — J069 Acute upper respiratory infection, unspecified: Secondary | ICD-10-CM | POA: Insufficient documentation

## 2012-09-15 NOTE — Assessment & Plan Note (Signed)
Blood pressure still uncontrolled, pt very reluctant to change medication and we discussed risks of elevated BP on heart function, she wants to monitor at home, will call her in 2 weeks to see how BP has been running

## 2012-09-15 NOTE — Assessment & Plan Note (Signed)
Mild cough, use cough medicine pt has at home, fluids, rest, cough lozanges

## 2012-09-24 LAB — BASIC METABOLIC PANEL
BUN: 17 mg/dL (ref 6–23)
CO2: 31 mEq/L (ref 19–32)
Chloride: 104 mEq/L (ref 96–112)
Glucose, Bld: 90 mg/dL (ref 70–99)
Potassium: 4 mEq/L (ref 3.5–5.3)
Sodium: 145 mEq/L (ref 135–145)

## 2012-09-24 LAB — CBC
MCH: 31 pg (ref 26.0–34.0)
MCHC: 33.6 g/dL (ref 30.0–36.0)
Platelets: 257 10*3/uL (ref 150–400)
RBC: 4.03 MIL/uL (ref 3.87–5.11)
RDW: 14.3 % (ref 11.5–15.5)

## 2012-09-24 LAB — LIPID PANEL: Cholesterol: 241 mg/dL — ABNORMAL HIGH (ref 0–200)

## 2012-09-28 ENCOUNTER — Telehealth: Payer: Self-pay | Admitting: Family Medicine

## 2012-09-28 MED ORDER — OMEGA-3-ACID ETHYL ESTERS 1 G PO CAPS: 1.0000 g | ORAL_CAPSULE | Freq: Two times a day (BID) | ORAL | Status: DC

## 2012-09-28 NOTE — Telephone Encounter (Signed)
See lab note.  

## 2012-10-01 ENCOUNTER — Telehealth: Payer: Self-pay | Admitting: Family Medicine

## 2012-10-08 NOTE — Telephone Encounter (Signed)
Called patient but no option to leave message.  Will send result letter.

## 2012-11-02 ENCOUNTER — Other Ambulatory Visit: Payer: Self-pay | Admitting: Family Medicine

## 2012-11-15 ENCOUNTER — Encounter: Payer: Self-pay | Admitting: Family Medicine

## 2012-11-24 ENCOUNTER — Other Ambulatory Visit: Payer: Self-pay | Admitting: Family Medicine

## 2012-12-07 ENCOUNTER — Encounter: Payer: Self-pay | Admitting: Family Medicine

## 2012-12-07 ENCOUNTER — Ambulatory Visit (INDEPENDENT_AMBULATORY_CARE_PROVIDER_SITE_OTHER): Payer: Medicare Other | Admitting: Family Medicine

## 2012-12-07 VITALS — BP 162/84 | HR 66 | Resp 18 | Ht 63.0 in | Wt 165.0 lb

## 2012-12-07 DIAGNOSIS — M25569 Pain in unspecified knee: Secondary | ICD-10-CM

## 2012-12-07 DIAGNOSIS — M25561 Pain in right knee: Secondary | ICD-10-CM

## 2012-12-07 DIAGNOSIS — E785 Hyperlipidemia, unspecified: Secondary | ICD-10-CM

## 2012-12-07 DIAGNOSIS — I10 Essential (primary) hypertension: Secondary | ICD-10-CM

## 2012-12-07 LAB — POCT URINALYSIS DIPSTICK
Leukocytes, UA: NEGATIVE
Nitrite, UA: NEGATIVE
Protein, UA: NEGATIVE
Urobilinogen, UA: 0.2
pH, UA: 6.5

## 2012-12-07 MED ORDER — RAMIPRIL 10 MG PO CAPS: 10.0000 mg | ORAL_CAPSULE | Freq: Every day | ORAL | Status: DC

## 2012-12-07 MED ORDER — HYDROCODONE-HOMATROPINE 5-1.5 MG/5ML PO SYRP: 5.0000 mL | ORAL_SOLUTION | Freq: Four times a day (QID) | ORAL | Status: DC | PRN

## 2012-12-07 NOTE — Assessment & Plan Note (Signed)
I think this is due to osteoarthritis she is reluctant to go back to Ortho-Est time. I'll have her use Aspercreme over-the-counter as well as ice she does have a knee stabilizer brace as well. We will hold on oral anti-inflammatories because of her history of ulcers

## 2012-12-07 NOTE — Addendum Note (Signed)
Addended by: Kandis Fantasia B on: 12/07/2012 05:11 PM   Modules accepted: Orders

## 2012-12-07 NOTE — Patient Instructions (Addendum)
Ramipril increased to 10mg , take 2 of your 5 mg blood pressures  Then get the new script  ICE your knee, use aspercreme for the knee Call Dr. Romeo Apple for appointment for your knee if the above does not help F/U 4 weeks- do not eat labs will needed

## 2012-12-07 NOTE — Progress Notes (Signed)
  Subjective:    Patient ID: Dawn Church, female    DOB: Jan 27, 1928, 79 y.o.   MRN: [id number]  HPI   Patient here to followup hypertension. She has had some allergy symptoms of runny eyes sneezing and cough on and off. She is use the cough medicine that she had previously for bronchitis a couple of days and this improved her cough will like to refill to have at home. She's also had some right knee pain which she's had on and off was told she has osteoarthritis in her knees. She's been using topical rub has noticed some swelling she is reluctant to go back to orthopedics Review of Systems - per above  GEN- denies fatigue, fever, weight loss,weakness, recent illness HEENT- + eye drainage, change in vision, nasal discharge, CVS- denies chest pain, palpitations RESP- denies SOB, cough, wheeze ABD- denies N/V, change in stools, abd pain GU- denies dysuria, hematuria, dribbling, incontinence MSK- + joint pain, muscle aches, injury Neuro- denies headache, dizziness, syncope, seizure activity      Objective:   Physical Exam  GEN- NAD, alert and oriented x3 HEENT- PERRL, EOMI, non injected sclera, pink conjunctiva, MMM, oropharynx clear, TM clear bilat no effusion, no  maxillary sinus tenderness,nares clear Neck- Supple, no LAD CVS- RRR, no murmur RESP-CTAB EXT- No edema MSK- Right knee- mild effusion, NT, ligaments in tact , fair ROM, Left knee-normal inspection, fair ROM Pulses- Radial 2+         Assessment & Plan:

## 2012-12-07 NOTE — Assessment & Plan Note (Signed)
Patient agrees to increase Altace to 10 mg I think this will gently improve her blood pressure she will continue the Maxzide

## 2012-12-07 NOTE — Assessment & Plan Note (Signed)
Check FLP in 4 weeks, declines statins

## 2012-12-28 ENCOUNTER — Encounter: Payer: Self-pay | Admitting: Orthopedic Surgery

## 2012-12-28 ENCOUNTER — Ambulatory Visit (INDEPENDENT_AMBULATORY_CARE_PROVIDER_SITE_OTHER): Payer: Medicare Other | Admitting: Orthopedic Surgery

## 2012-12-28 ENCOUNTER — Ambulatory Visit (INDEPENDENT_AMBULATORY_CARE_PROVIDER_SITE_OTHER): Payer: Medicare Other

## 2012-12-28 VITALS — BP 162/88 | Ht 63.0 in | Wt 164.0 lb

## 2012-12-28 DIAGNOSIS — M25561 Pain in right knee: Secondary | ICD-10-CM

## 2012-12-28 DIAGNOSIS — IMO0002 Reserved for concepts with insufficient information to code with codable children: Secondary | ICD-10-CM

## 2012-12-28 DIAGNOSIS — M1712 Unilateral primary osteoarthritis, left knee: Secondary | ICD-10-CM | POA: Insufficient documentation

## 2012-12-28 DIAGNOSIS — M25569 Pain in unspecified knee: Secondary | ICD-10-CM

## 2012-12-28 DIAGNOSIS — M7052 Other bursitis of knee, left knee: Secondary | ICD-10-CM

## 2012-12-28 DIAGNOSIS — M7051 Other bursitis of knee, right knee: Secondary | ICD-10-CM

## 2012-12-28 NOTE — Progress Notes (Signed)
Patient ID: Dawn Church, female   DOB: July 11, 1928, 79 y.o.   MRN: [id number] Chief Complaint  Patient presents with  . Knee Pain    Right knee pain, no injury    History The patient reports atraumatic sudden onset of right knee pain about 4 weeks ago. She saw her primary care physician and she was advised to seek orthopedic evaluation. The pain and swelling over the medial aspect of the knee are improving although she still has 5/10 intermittent pain which is worse with activity and relieved by rest and the pain intensity comes and goes but is otherwise constant  Review of systems occasional cough and temperature intolerance otherwise normal  BP 162/88  Ht 5\' 3"  (1.6 m)  Wt 164 lb (74.39 kg)  BMI 29.06 kg/m2 General appearance is normal, the patient is alert and oriented x3 with normal mood and affect. She is ambulatory no assisted devices  The medial bursa is swollen just very minimal medial joint line tenderness most of the pain and tenderness are centered over the bursa. Range of motion is preserved knee is stable strength is normal scans intact pulses are good normal sensation is noted  We will take an x-ray  X-ray shows moderately severe degenerative arthritis of the right knee   Diagnosis Right knee pain - Plan: DG Knee AP/LAT W/Sunrise Right  Pes anserinus bursitis of left knee  Arthritis of knee, left   Plan  Inject the bursa  Knee  Injection Procedure Note  Pre-operative Diagnosis: right  knee bursitis  Post-operative Diagnosis: same  Indications: pain  Anesthesia: ethyl chloride   Procedure Details   Verbal consent was obtained for the procedure. Time out was completed.The bursa  was prepped with alcohol, followed by  Ethyl chloride spray and A 25 gauge needle was inserted into the pes bursa via lateral approach; 4ml 1% lidocaine and 1 ml of depomedrol  was then injected into the joint . The needle was removed and the area cleansed and  dressed.  Complications:  None; patient tolerated the procedure well.

## 2012-12-28 NOTE — Patient Instructions (Addendum)
You have received a steroid shot. 15% of patients experience increased pain at the injection site with in the next 24 hours. This is best treated with ice and tylenol extra strength 2 tabs every 8 hours. If you are still having pain please call the office.   Bursitis Bursitis Bursitis is a swelling and soreness (inflammation) of a fluid-filled sac (bursa) that overlies and protects a joint. It can be caused by injury, overuse of the joint, arthritis or infection. The joints most likely to be affected are the elbows, shoulders, hips and knees. HOME CARE INSTRUCTIONS   Apply ice to the affected area for 15 to 20 minutes each hour while awake for 2 days. Put the ice in a plastic bag and place a towel between the bag of ice and your skin.  Rest the injured joint as much as possible, but continue to put the joint through a full range of motion, 4 times per day. (The shoulder joint especially becomes rapidly "frozen" if not used.) When the pain lessens, begin normal slow movements and usual activities.  Only take over-the-counter or prescription medicines for pain, discomfort or fever as directed by your caregiver.  Your caregiver may recommend draining the bursa and injecting medicine into the bursa. This may help the healing process.  Follow all instructions for follow-up with your caregiver. This includes any orthopedic referrals, physical therapy and rehabilitation. Any delay in obtaining necessary care could result in a delay or failure of the bursitis to heal and chronic pain. SEEK IMMEDIATE MEDICAL CARE IF:   Your pain increases even during treatment.  You develop an oral temperature above 102 F (38.9 C) and have heat and inflammation over the involved bursa. MAKE SURE YOU:   Understand these instructions.  Will watch your condition.  Will get help right away if you are not doing well or get worse. Document Released: 08/15/2000 Document Revised: 11/10/2011 Document Reviewed:  07/20/2009 [facility] Patient Information 2013 [place], [place].

## 2013-01-03 ENCOUNTER — Other Ambulatory Visit (HOSPITAL_COMMUNITY): Payer: Self-pay | Admitting: Internal Medicine

## 2013-01-04 ENCOUNTER — Ambulatory Visit: Payer: Medicare Other | Admitting: Family Medicine

## 2013-01-06 ENCOUNTER — Other Ambulatory Visit: Payer: Self-pay | Admitting: Family Medicine

## 2013-01-06 ENCOUNTER — Encounter: Payer: Self-pay | Admitting: Family Medicine

## 2013-01-06 ENCOUNTER — Ambulatory Visit (INDEPENDENT_AMBULATORY_CARE_PROVIDER_SITE_OTHER): Payer: Medicare Other | Admitting: Family Medicine

## 2013-01-06 VITALS — BP 190/90 | HR 60 | Resp 18 | Ht 63.0 in | Wt 164.0 lb

## 2013-01-06 DIAGNOSIS — I10 Essential (primary) hypertension: Secondary | ICD-10-CM

## 2013-01-06 NOTE — Patient Instructions (Addendum)
Take your blood pressure on the upper part of the arm Take your blood pressure after you have had your medications in your system at least an hour  Continue the current medications I will follow up by phone in 1 week F/U 3 months

## 2013-01-06 NOTE — Progress Notes (Signed)
  Subjective:    Patient ID: Dawn Church, female    DOB: 1928/05/15, 79 y.o.   MRN: [id number]  HPI   Patient here for interim visit for her blood pressure. She was seen 4 weeks ago her ramipril was increased to the maximum dose of 10 mg along with her Maxide. Her blood pressures at home have been very labile the caveat is however that some of them are before she takes her medications and some afterwards. She also switches between cuffs she has a wrist cuff which is automated and in her daughter takes her blood pressure manually with a brachial cuff. Her blood pressures have ranged from 138-187/70-106 She denies any headache confusion chest pain shortness of breath leg swelling. She was seen by the orthopedic surgeon and had a knee injection done one week ago her blood pressure in the office was 162/88.  Review of Systems   GEN- denies fatigue, fever, weight loss,weakness, recent illness HEENT- denies eye drainage, change in vision, nasal discharge, CVS- denies chest pain, palpitations RESP- denies SOB, cough, wheezey Neuro- denies headache, dizziness, syncope, seizure activity      Objective:   Physical Exam GEN- NAD, alert and oriented x3 CVS- RRR, no murmur RESP-CTAB EXT- No edema Pulses- Radial, DP- 2+        Assessment & Plan:

## 2013-01-07 LAB — BASIC METABOLIC PANEL
Calcium: 9.9 mg/dL (ref 8.4–10.5)
Potassium: 3.7 mEq/L (ref 3.5–5.3)
Sodium: 143 mEq/L (ref 135–145)

## 2013-01-07 LAB — LIPID PANEL
Cholesterol: 238 mg/dL — ABNORMAL HIGH (ref 0–200)
Total CHOL/HDL Ratio: 3.1 Ratio
Triglycerides: 74 mg/dL (ref <150)
VLDL: 15 mg/dL (ref 0–40)

## 2013-01-07 LAB — CBC
MCHC: 32.9 g/dL (ref 30.0–36.0)
RDW: 14.7 % (ref 11.5–15.5)

## 2013-01-09 NOTE — Assessment & Plan Note (Signed)
No meds taken as she went for fasting labs Goal would be to get her around 150 systolic Asymptomatic F/ u via phone, she is to use brachial cuff only She is very reluctant to change bp meds

## 2013-01-11 ENCOUNTER — Emergency Department (HOSPITAL_COMMUNITY): Payer: Medicare Other

## 2013-01-11 ENCOUNTER — Telehealth: Payer: Self-pay | Admitting: Family Medicine

## 2013-01-11 ENCOUNTER — Emergency Department (HOSPITAL_COMMUNITY)
Admission: EM | Admit: 2013-01-11 | Discharge: 2013-01-11 | Disposition: A | Discharge status: Home / Self Care | Payer: Medicare Other | Attending: Emergency Medicine | Admitting: Emergency Medicine

## 2013-01-11 ENCOUNTER — Encounter (HOSPITAL_COMMUNITY): Payer: Self-pay | Admitting: Emergency Medicine

## 2013-01-11 DIAGNOSIS — Z862 Personal history of diseases of the blood and blood-forming organs and certain disorders involving the immune mechanism: Secondary | ICD-10-CM | POA: Insufficient documentation

## 2013-01-11 DIAGNOSIS — E860 Dehydration: Secondary | ICD-10-CM

## 2013-01-11 DIAGNOSIS — R5381 Other malaise: Secondary | ICD-10-CM | POA: Insufficient documentation

## 2013-01-11 DIAGNOSIS — E876 Hypokalemia: Secondary | ICD-10-CM | POA: Insufficient documentation

## 2013-01-11 DIAGNOSIS — I10 Essential (primary) hypertension: Secondary | ICD-10-CM | POA: Insufficient documentation

## 2013-01-11 DIAGNOSIS — R5383 Other fatigue: Secondary | ICD-10-CM | POA: Insufficient documentation

## 2013-01-11 DIAGNOSIS — Z8673 Personal history of transient ischemic attack (TIA), and cerebral infarction without residual deficits: Secondary | ICD-10-CM | POA: Insufficient documentation

## 2013-01-11 DIAGNOSIS — R112 Nausea with vomiting, unspecified: Secondary | ICD-10-CM

## 2013-01-11 DIAGNOSIS — Z7982 Long term (current) use of aspirin: Secondary | ICD-10-CM | POA: Insufficient documentation

## 2013-01-11 DIAGNOSIS — Z8719 Personal history of other diseases of the digestive system: Secondary | ICD-10-CM | POA: Insufficient documentation

## 2013-01-11 DIAGNOSIS — Z79899 Other long term (current) drug therapy: Secondary | ICD-10-CM | POA: Insufficient documentation

## 2013-01-11 DIAGNOSIS — E7800 Pure hypercholesterolemia, unspecified: Secondary | ICD-10-CM | POA: Insufficient documentation

## 2013-01-11 LAB — CBC WITH DIFFERENTIAL/PLATELET
Basophils Relative: 0 % (ref 0–1)
Eosinophils Absolute: 0 10*3/uL (ref 0.0–0.7)
HCT: 38 % (ref 36.0–46.0)
Hemoglobin: 12.9 g/dL (ref 12.0–15.0)
Lymphs Abs: 1 10*3/uL (ref 0.7–4.0)
MCH: 31.7 pg (ref 26.0–34.0)
MCHC: 33.9 g/dL (ref 30.0–36.0)
MCV: 93.4 fL (ref 78.0–100.0)
Monocytes Absolute: 0.4 10*3/uL (ref 0.1–1.0)
Monocytes Relative: 5 % (ref 3–12)

## 2013-01-11 LAB — COMPREHENSIVE METABOLIC PANEL
Albumin: 3.4 g/dL — ABNORMAL LOW (ref 3.5–5.2)
BUN: 16 mg/dL (ref 6–23)
Creatinine, Ser: 0.85 mg/dL (ref 0.50–1.10)
GFR calc Af Amer: 72 mL/min — ABNORMAL LOW (ref >90)
Glucose, Bld: 155 mg/dL — ABNORMAL HIGH (ref 70–99)
Total Bilirubin: 0.5 mg/dL (ref 0.3–1.2)
Total Protein: 6.8 g/dL (ref 6.0–8.3)

## 2013-01-11 LAB — TROPONIN I: Troponin I: <0.3 ng/mL (ref <0.30)

## 2013-01-11 LAB — LIPASE, BLOOD: Lipase: 18 U/L (ref 11–59)

## 2013-01-11 MED ORDER — ONDANSETRON 4 MG PO TBDP: 4.0000 mg | ORAL_TABLET | Freq: Three times a day (TID) | ORAL | Status: DC | PRN

## 2013-01-11 MED ORDER — ONDANSETRON HCL 4 MG/2ML IJ SOLN
INTRAMUSCULAR | Status: AC
  Administered 2013-01-11: 4 mg via INTRAVENOUS
  Filled 2013-01-11: qty 2

## 2013-01-11 MED ORDER — ONDANSETRON HCL 4 MG/2ML IJ SOLN: 4.0000 mg | Freq: Once | INTRAMUSCULAR | Status: AC

## 2013-01-11 MED ORDER — ONDANSETRON HCL 4 MG/2ML IJ SOLN
4.0000 mg | Freq: Once | INTRAMUSCULAR | Status: AC
  Administered 2013-01-11: 4 mg via INTRAVENOUS
  Filled 2013-01-11: qty 2

## 2013-01-11 MED ORDER — SODIUM CHLORIDE 0.9 % IV BOLUS (SEPSIS)
1000.0000 mL | Freq: Once | INTRAVENOUS | Status: AC
  Administered 2013-01-11: 1000 mL via INTRAVENOUS

## 2013-01-11 MED ORDER — POTASSIUM CHLORIDE ER 20 MEQ PO TBCR: 20.0000 meq | EXTENDED_RELEASE_TABLET | Freq: Two times a day (BID) | ORAL | Status: DC

## 2013-01-11 NOTE — ED Notes (Signed)
Patient states that she continues to have nausea, medicated.

## 2013-01-11 NOTE — Telephone Encounter (Signed)
Called pt no answer °

## 2013-01-11 NOTE — ED Notes (Addendum)
States that she ate a fish sandwich yesterday afternoon and has been having nausea and vomiting ever since.  States that she has not had a bowel movement today.  States that she has been unable to take her regular medications because she has not been able to keep anything on her stomach due to her nausea and vomiting.

## 2013-01-11 NOTE — ED Notes (Signed)
Pt assisted out of bed, states that she does feel a little dizzy, assisted to restroom via wheelchair.

## 2013-01-11 NOTE — Telephone Encounter (Signed)
Called pt back no answer. See previous message

## 2013-01-11 NOTE — ED Notes (Signed)
Patient c/o the nausea with the feeling that she needs to vomit and/or have a bowel movement but has been unable to do either. Patient denies feeling like something is stuck in throat states "It feels like something there blocking me from throwing up." When asked kind of like a pressure mid-sternal in her chest.

## 2013-01-11 NOTE — Telephone Encounter (Signed)
Called pt today, noted she is currently being evaluated in the ER for abdominal pain Left a message for family Please calla and check on her in the AM if she is not admitted  Wanted to see what bp have been running, labs looks good, cholesterol is still high.

## 2013-01-11 NOTE — ED Provider Notes (Signed)
History    This chart was scribed for Ward Givens, MD by [name], ED Scribe. The patient was seen in room APA02/APA02. Patient's care was started at 10:46 AM.    CSN: [id number]  Arrival date & time 01/11/13  1000   None     Chief Complaint  Patient presents with  . Nausea    (Consider location/radiation/quality/duration/timing/severity/associated sxs/prior treatment) The history is provided by the patient and a relative. No language interpreter was used.   HPI Comments: Dawn Church is a 79 y.o. female with h/o TIA, CVA, and epigastric pain who presents to the Emergency Department complaining of moderate constant nausea with associated intermittent left sided chest pain which started yesterday. Pt states that she ate  a fish sandwich yesterday about 2 pm and about 1 hours later she started to feel sick stating she was feeling "funny" and "different" at her daughters house. Daughter states her family had a fish sandwich as well with no digestive issues. She states that about 5:30 she felt like she needed to vomit or have a bowel movement but was unable to do so. Sx's progressively got worse resulting in pt having an episode of emesis and dry heaving which triggered her left sided chest pain with a "fullness" sensation. She states that it feels as if she is stopped up in her chest when sx's occur. Once pt stopped having the dry heaving her chest pain alleviated. Pt got daughter to bring her to the ED today due to persistency of her nausea. Pt states that she has had about 2-3 episodes of emesis since onset and still complains that she has to have a bowel movement but is unable to do so. Pt denies any abdominal pain, fever, cough, diarrhea, SOB,  and any other associated symptoms. Patient denies any spinning sensation or feeling of movement. She states she's never had this before. Patient states she other than the chest pain she had with the dry heaving she did not have any pain. She states  she just feels weak today.   Pt lives alone and does not smoke tobacco or drink alcohol. Pt has hx of a hysterectomy and an appendectomy.   Dr. Jeanice Lim is Pt's PCP.  Past Medical History  Diagnosis Date  . TIA (transient ischemic attack)   . Epigastric pain   . Stomach ulcer   . Hemochromatosis   . Anemia   . CVA (cerebral infarction)   . Hypercholesteremia   . Hypertension   . Hx of seasonal allergies     Past Surgical History  Procedure Laterality Date  . Vaginal hysterectomy    . Appendectomy    . Esophagogastroduodenoscopy  12/20/2010  . Breast biopsy      Bilateral  . Breast biopsy      bilateral  . Shoulder open rotator cuff repair      right   . Cataract extraction, bilateral      Family History  Problem Relation Age of Onset  . Colon cancer Neg Hx   . Diabetes    . Cancer      History  Substance Use Topics  . Smoking status: Never Smoker   . Smokeless tobacco: Never Used  . Alcohol Use: No  lives at home Lives alone  OB History   Grav Para Term Preterm Abortions TAB SAB Ect Mult Living   10 10 10       10       Review of Systems  Constitutional: Positive  for fatigue.  Cardiovascular: Positive for chest pain.  Gastrointestinal: Positive for nausea and vomiting.  All other systems reviewed and are negative.    Allergies  Codeine  Home Medications   Current Outpatient Rx  Name  Route  Sig  Dispense  Refill  . aspirin 81 MG tablet   Oral   Take 81 mg by mouth daily.           . Flaxseed, Linseed, 1000 MG CAPS   Oral   Take 1 capsule by mouth as needed.          Marland Kitchen HYDROcodone-homatropine (HYCODAN) 5-1.5 MG/5ML syrup   Oral   Take 5 mLs by mouth every 6 (six) hours as needed.   240 mL   0   . hydroxypropyl methylcellulose (ISOPTO TEARS) 2.5 % ophthalmic solution   Both Eyes   Place 1 drop into both eyes daily as needed.           . Multiple Vitamin (MULTIVITAMIN) tablet   Oral   Take 1 tablet by mouth daily.            Marland Kitchen omega-3 acid ethyl esters (LOVAZA) 1 G capsule   Oral   Take 1 capsule (1 g total) by mouth 2 (two) times daily.   60 capsule   3   . pantoprazole (PROTONIX) 40 MG tablet      TAKE 1 TABLET (40 MG TOTAL) BY MOUTH DAILY BEFORE BREAKFAST.   30 tablet   5   . ramipril (ALTACE) 10 MG capsule   Oral   Take 1 capsule (10 mg total) by mouth daily.   30 capsule   6   . traMADol-acetaminophen (ULTRACET) 37.5-325 MG per tablet   Oral   Take 1 tablet by mouth every 6 (six) hours as needed.   90 tablet   3   . triamterene-hydrochlorothiazide (MAXZIDE-25) 37.5-25 MG per tablet   Oral   Take 1 each (1 tablet total) by mouth daily.   30 tablet   3     BP 121/99  Temp(Src) 98.2 F (36.8 C) (Oral)  Resp 20  Ht 5\' 3"  (1.6 m)  Wt 164 lb (74.39 kg)  BMI 29.06 kg/m2  SpO2 99%  Laboratory interpretation all normal   Physical Exam  Nursing note and vitals reviewed. Constitutional: She is oriented to person, place, and time. She appears well-developed and well-nourished.  Non-toxic appearance. She does not appear ill. No distress.  HENT:  Head: Normocephalic and atraumatic.  Right Ear: External ear normal.  Left Ear: External ear normal.  Nose: Nose normal. No mucosal edema or rhinorrhea.  Mouth/Throat: Mucous membranes are dry. No dental abscesses or edematous.  Eyes: Conjunctivae and EOM are normal. Pupils are equal, round, and reactive to light.  Neck: Normal range of motion and full passive range of motion without pain. Neck supple.  Cardiovascular: Normal rate, regular rhythm and normal heart sounds.  Exam reveals no gallop and no friction rub.   No murmur heard. Pulmonary/Chest: Effort normal and breath sounds normal. No respiratory distress. She has no wheezes. She has no rhonchi. She has no rales. She exhibits no tenderness and no crepitus.  Abdominal: Soft. Normal appearance and bowel sounds are normal. She exhibits no distension. There is no tenderness. There is no  rebound and no guarding.  Musculoskeletal: Normal range of motion. She exhibits no edema and no tenderness.     Neurological: She is alert and oriented to person, place, and time. She  has normal strength. No cranial nerve deficit.  Skin: Skin is warm, dry and intact. No rash noted. No erythema. No pallor.  Psychiatric: She has a normal mood and affect. Her speech is normal and behavior is normal. Her mood appears not anxious.    ED Course  Procedures (including critical care time)  Medications  ondansetron (ZOFRAN) injection 4 mg (4 mg Intravenous Given 01/11/13 1029)  sodium chloride 0.9 % bolus 1,000 mL (0 mLs Intravenous Stopped 01/11/13 1232)  ondansetron (ZOFRAN) injection 4 mg (4 mg Intravenous Given 01/11/13 1128)    DIAGNOSTIC STUDIES: Oxygen Saturation is 99% on room air, normal by my interpretation.    COORDINATION OF CARE: 10:58 AM Discussed ED treatment with pt and pt agrees.   Patient was given IV fluids and nausea medication. She has been sleeping with no more nausea or vomiting. She is feeling improved at the end of her ED visit.   Laboratory results Comprehensive medical normal except for potassium low at 3.2 and glucose high at 155 and albumin minimally low at 3.4 Lipase normal CBC white count 8.8 K, hemoglobin 12.9, platelet count 205,000 with 84% segs and 11% lymphocytes and 4.7% monocytes Troponin normal at less than 0.3    Dg Chest Portable 1 View  01/11/2013  *RADIOLOGY REPORT*  Clinical Data: Nausea.  PORTABLE CHEST - 1 VIEW  Comparison: PA and lateral chest 05/31/2012.  Findings: Lungs are clear.  Heart size is normal.  No pneumothorax or pleural fluid.  Postoperative change right shoulder noted.  IMPRESSION: No acute disease.   Original Report Authenticated By: Holley Dexter, M.D.    Dg Abd 2 Views  01/11/2013  *RADIOLOGY REPORT*  Clinical Data: Vomiting  ABDOMEN - 2 VIEW  Comparison: CT abdomen pelvis 11/17/2010.  Findings: No free intraperitoneal air  is identified.  Bowel gas pattern is unremarkable.  Multilevel lumbar degenerative change is noted.  IMPRESSION: No acute finding.   Original Report Authenticated By: Holley Dexter, M.D.    Dg Knee Ap/lat W/sunrise Right  12/29/2012  Impression valgus osteoarthritis with 3 compartment disease    Date: 01/11/2013  Rate: 61  Rhythm: normal sinus rhythm  QRS Axis: normal  Intervals: QT prolonged  ST/T Wave abnormalities: NSTWC  Conduction Disutrbances:none  Narrative Interpretation:   Old EKG Reviewed: unchanged from 11/17/2010     1. Nausea and vomiting   2. Dehydration   3. Hypokalemia with normal acid-base balance    New Prescriptions   ONDANSETRON (ZOFRAN ODT) 4 MG DISINTEGRATING TABLET    Take 1 tablet (4 mg total) by mouth every 8 (eight) hours as needed for nausea.   POTASSIUM CHLORIDE 20 MEQ TBCR    Take 20 mEq by mouth 2 (two) times daily.    Plan discharge  Devoria Albe, MD, FACEP    MDM  patient presents with nausea and vomiting after eating a sandwich. Nobody else in her family got sick. She is noted to have hypokalemia which may be from her vomiting her also her diuretic blood pressure pill. She will be placed on short-term potassium supplementation and her doctor can see if her potassium level improves.    I personally performed the services described in this documentation, which was scribed in my presence. The recorded information has been reviewed and considered.  Devoria Albe, MD, Armando Gang    Ward Givens, MD 01/11/13 1346

## 2013-01-12 ENCOUNTER — Telehealth: Payer: Self-pay | Admitting: Family Medicine

## 2013-01-12 ENCOUNTER — Other Ambulatory Visit (HOSPITAL_COMMUNITY): Payer: Self-pay | Admitting: Internal Medicine

## 2013-01-13 ENCOUNTER — Telehealth: Payer: Self-pay | Admitting: Family Medicine

## 2013-01-13 NOTE — Telephone Encounter (Signed)
Error

## 2013-01-14 NOTE — Telephone Encounter (Signed)
Patient aware of results.

## 2013-01-14 NOTE — Telephone Encounter (Signed)
Wants lab results. Do you want to add a result message before I call?

## 2013-01-14 NOTE — Telephone Encounter (Signed)
mon 160/79 tues 149/68 Wed 155/78 thurs 155/81 173/88 168/89

## 2013-01-14 NOTE — Telephone Encounter (Signed)
Home bp are better than in the office, she can continue current medications F/U at [facility] 3 months

## 2013-01-20 NOTE — Telephone Encounter (Signed)
Dr Jeanice Lim left message with her lab results

## 2013-02-08 ENCOUNTER — Other Ambulatory Visit: Payer: Self-pay | Admitting: Family Medicine

## 2013-04-06 ENCOUNTER — Telehealth: Payer: Self-pay | Admitting: Family Medicine

## 2013-04-07 ENCOUNTER — Other Ambulatory Visit: Payer: Self-pay | Admitting: Family Medicine

## 2013-04-07 DIAGNOSIS — E785 Hyperlipidemia, unspecified: Secondary | ICD-10-CM

## 2013-04-07 DIAGNOSIS — E079 Disorder of thyroid, unspecified: Secondary | ICD-10-CM

## 2013-04-07 DIAGNOSIS — I10 Essential (primary) hypertension: Secondary | ICD-10-CM

## 2013-04-07 NOTE — Telephone Encounter (Signed)
Labs ordered.

## 2013-04-08 ENCOUNTER — Other Ambulatory Visit: Payer: Self-pay | Admitting: Family Medicine

## 2013-04-08 LAB — CBC WITH DIFFERENTIAL/PLATELET
Basophils Relative: 0 % (ref 0–1)
Eosinophils Absolute: 0.1 10*3/uL (ref 0.0–0.7)
Hemoglobin: 11.8 g/dL — ABNORMAL LOW (ref 12.0–15.0)
MCH: 30.6 pg (ref 26.0–34.0)
MCHC: 32.5 g/dL (ref 30.0–36.0)
Monocytes Relative: 6 % (ref 3–12)
Neutro Abs: 5 10*3/uL (ref 1.7–7.7)
Neutrophils Relative %: 72 % (ref 43–77)
Platelets: 248 10*3/uL (ref 150–400)
RBC: 3.85 MIL/uL — ABNORMAL LOW (ref 3.87–5.11)

## 2013-04-09 LAB — LIPID PANEL
Cholesterol: 242 mg/dL — ABNORMAL HIGH (ref 0–200)
HDL: 66 mg/dL (ref >39)
Total CHOL/HDL Ratio: 3.7 Ratio
Triglycerides: 108 mg/dL (ref <150)
VLDL: 22 mg/dL (ref 0–40)

## 2013-04-09 LAB — BASIC METABOLIC PANEL
Calcium: 9.6 mg/dL (ref 8.4–10.5)
Glucose, Bld: 98 mg/dL (ref 70–99)
Potassium: 3.8 mEq/L (ref 3.5–5.3)
Sodium: 144 mEq/L (ref 135–145)

## 2013-04-11 ENCOUNTER — Ambulatory Visit (INDEPENDENT_AMBULATORY_CARE_PROVIDER_SITE_OTHER): Payer: Medicare Other | Admitting: Family Medicine

## 2013-04-11 ENCOUNTER — Encounter: Payer: Self-pay | Admitting: Family Medicine

## 2013-04-11 VITALS — BP 140/80 | HR 78 | Temp 97.5°F | Resp 16 | Wt 173.0 lb

## 2013-04-11 DIAGNOSIS — M159 Polyosteoarthritis, unspecified: Secondary | ICD-10-CM

## 2013-04-11 DIAGNOSIS — E785 Hyperlipidemia, unspecified: Secondary | ICD-10-CM

## 2013-04-11 DIAGNOSIS — I10 Essential (primary) hypertension: Secondary | ICD-10-CM

## 2013-04-11 MED ORDER — OMEGA-3-ACID ETHYL ESTERS 1 G PO CAPS: ORAL_CAPSULE | ORAL | Status: DC

## 2013-04-11 MED ORDER — TRAMADOL-ACETAMINOPHEN 37.5-325 MG PO TABS: 1.0000 | ORAL_TABLET | Freq: Four times a day (QID) | ORAL | Status: DC | PRN

## 2013-04-11 MED ORDER — PANTOPRAZOLE SODIUM 40 MG PO TBEC: DELAYED_RELEASE_TABLET | ORAL | Status: DC

## 2013-04-11 MED ORDER — TRIAMTERENE-HCTZ 37.5-25 MG PO TABS: 1.0000 | ORAL_TABLET | Freq: Every day | ORAL | Status: DC

## 2013-04-11 NOTE — Assessment & Plan Note (Signed)
OA of knees, Lumbar spine She declines changes in her meds Will take ultracet BID

## 2013-04-11 NOTE — Assessment & Plan Note (Signed)
H/O CVA LDL above goal. She will try red yeast rice Declines statins

## 2013-04-11 NOTE — Assessment & Plan Note (Signed)
Improved BP,

## 2013-04-11 NOTE — Patient Instructions (Addendum)
Red yeast rice- take as directed on bottle -  Continue current medications Take the ultram twice  A day for pain F/U 3 months- fasting lipid

## 2013-04-11 NOTE — Progress Notes (Signed)
  Subjective:    Patient ID: Dawn Church, female    DOB: 06/08/1928, 79 y.o.   MRN: [id number]  HPI  Pt here to f/u chronic medical problems. Has had increased joint stiffness and pain past few months. Seen by ortho in the past. Pain in bilateral knees and radiates to posterior thighs and hips. Occasional low back pain. Denies tingling or numbness in legs or feet. No recent falls HTN- taking BP meds as prescribed Fasting labs reviewed.  Review of Systems  GEN- denies fatigue, fever, weight loss,weakness, recent illness HEENT- denies eye drainage, change in vision, nasal discharge, CVS- denies chest pain, palpitations RESP- denies SOB, cough, wheeze ABD- denies N/V, change in stools, abd pain GU- denies dysuria, hematuria, dribbling, incontinence MSK- + joint pain, muscle aches, injury Neuro- denies headache, dizziness, syncope, seizure activity      Objective:   Physical Exam  GEN- NAD, alert and oriented x3 HEENT- PERRL, EOMI, non injected sclera, pink conjunctiva, MMM, oropharynx clear Neck- Supple, no bruit CVS- RRR, no murmur RESP-CTAB MSK- FAIR ROM bilatearl HIPS, no pain with ROM, Bilat knees, mild crepitus, normal inspection no effusion, fair ROM EXT- No edema Pulses- Radial, DP- 2+ PT 2+       Assessment & Plan:

## 2013-04-12 ENCOUNTER — Other Ambulatory Visit: Payer: Self-pay | Admitting: *Deleted

## 2013-04-12 DIAGNOSIS — M7052 Other bursitis of knee, left knee: Secondary | ICD-10-CM

## 2013-04-12 DIAGNOSIS — M7051 Other bursitis of knee, right knee: Secondary | ICD-10-CM

## 2013-04-12 MED ORDER — TRAMADOL-ACETAMINOPHEN 37.5-325 MG PO TABS: 1.0000 | ORAL_TABLET | Freq: Four times a day (QID) | ORAL | Status: DC | PRN

## 2013-04-20 ENCOUNTER — Other Ambulatory Visit: Payer: Self-pay | Admitting: Family Medicine

## 2013-04-20 DIAGNOSIS — Z139 Encounter for screening, unspecified: Secondary | ICD-10-CM

## 2013-04-25 ENCOUNTER — Telehealth: Payer: Self-pay | Admitting: Family Medicine

## 2013-04-25 NOTE — Telephone Encounter (Signed)
Letter written

## 2013-04-25 NOTE — Telephone Encounter (Signed)
Pt needs a letter stating that she can get a renewal on her handicap sticker.

## 2013-05-12 ENCOUNTER — Ambulatory Visit (HOSPITAL_COMMUNITY)
Admission: RE | Admit: 2013-05-12 | Discharge: 2013-05-12 | Disposition: A | Discharge status: Home / Self Care | Payer: Medicare Other | Source: Ambulatory Visit | Attending: Family Medicine | Admitting: Family Medicine

## 2013-05-12 DIAGNOSIS — Z1231 Encounter for screening mammogram for malignant neoplasm of breast: Secondary | ICD-10-CM | POA: Insufficient documentation

## 2013-05-12 DIAGNOSIS — Z139 Encounter for screening, unspecified: Secondary | ICD-10-CM

## 2013-07-11 ENCOUNTER — Other Ambulatory Visit: Payer: Self-pay | Admitting: *Deleted

## 2013-07-11 MED ORDER — RAMIPRIL 10 MG PO CAPS: 10.0000 mg | ORAL_CAPSULE | Freq: Every day | ORAL | Status: DC

## 2013-07-11 NOTE — Telephone Encounter (Signed)
Med refilled.

## 2013-07-12 ENCOUNTER — Ambulatory Visit (INDEPENDENT_AMBULATORY_CARE_PROVIDER_SITE_OTHER): Payer: Medicare Other | Admitting: Family Medicine

## 2013-07-12 VITALS — BP 150/90 | HR 78 | Temp 97.9°F | Resp 18 | Ht 61.0 in | Wt 169.0 lb

## 2013-07-12 DIAGNOSIS — Z23 Encounter for immunization: Secondary | ICD-10-CM

## 2013-07-12 DIAGNOSIS — I10 Essential (primary) hypertension: Secondary | ICD-10-CM

## 2013-07-12 DIAGNOSIS — E785 Hyperlipidemia, unspecified: Secondary | ICD-10-CM

## 2013-07-12 LAB — COMPREHENSIVE METABOLIC PANEL WITH GFR
ALT: 21 U/L (ref 0–35)
AST: 34 U/L (ref 0–37)
Albumin: 3.7 g/dL (ref 3.5–5.2)
Alkaline Phosphatase: 76 U/L (ref 39–117)
BUN: 18 mg/dL (ref 6–23)
CO2: 27 meq/L (ref 19–32)
Calcium: 9.8 mg/dL (ref 8.4–10.5)
Chloride: 103 meq/L (ref 96–112)
Creat: 1 mg/dL (ref 0.50–1.10)
Glucose, Bld: 100 mg/dL — ABNORMAL HIGH (ref 70–99)
Potassium: 3.8 meq/L (ref 3.5–5.3)
Sodium: 142 meq/L (ref 135–145)
Total Bilirubin: 0.5 mg/dL (ref 0.3–1.2)
Total Protein: 6.5 g/dL (ref 6.0–8.3)

## 2013-07-12 LAB — LIPID PANEL
Cholesterol: 222 mg/dL — ABNORMAL HIGH (ref 0–200)
HDL: 60 mg/dL
LDL Cholesterol: 139 mg/dL — ABNORMAL HIGH (ref 0–99)
Total CHOL/HDL Ratio: 3.7 ratio
Triglycerides: 117 mg/dL
VLDL: 23 mg/dL (ref 0–40)

## 2013-07-12 NOTE — Patient Instructions (Signed)
Continue current medications Flu shot given We will send a letter with lab results if normal F/U 4 months

## 2013-07-12 NOTE — Progress Notes (Signed)
  Subjective:    Patient ID: Dawn Church, female    DOB: [birth year], 79 y.o.   MRN: [id number]  HPI  Patient here to follow chronic medical problems. She has no specific concerns today. She would like to have her fasting lipids rechecked today. She's been taking her fish oil and watching her diet. She's tolerating her other medications without any difficulties. She did have some problems getting her ramipril from the  pharmacist therefore we need to check into this. Does not take any blood pressure medications this morning as she is fasting   Review of Systems   GEN- denies fatigue, fever, weight loss,weakness, recent illness HEENT- denies eye drainage, change in vision, nasal discharge, CVS- denies chest pain, palpitations RESP- denies SOB, cough, wheeze ABD- denies N/V, change in stools, abd pain GU- denies dysuria, hematuria, dribbling, incontinence MSK- + joint pain, muscle aches, injury Neuro- denies headache, dizziness, syncope, seizure activity      Objective:   Physical Exam   GEN- NAD, alert and oriented x3 HEENT- PERRL, EOMI, non injected sclera, pink conjunctiva, MMM, oropharynx clear CVS- RRR, no murmur RESP-CTAB EXT- No edema Pulses- Radial, DP- 2+       Assessment & Plan:

## 2013-07-13 ENCOUNTER — Encounter: Payer: Self-pay | Admitting: Family Medicine

## 2013-07-13 NOTE — Assessment & Plan Note (Signed)
Recheck FLP, pt declines statins On Fish oil

## 2013-07-13 NOTE — Assessment & Plan Note (Signed)
Despite not having her medications today her blood pressure still looks okay. We will continue current meds we will look into why she cannot get her Ramipril

## 2013-07-15 ENCOUNTER — Encounter: Payer: Self-pay | Admitting: *Deleted

## 2013-07-25 ENCOUNTER — Encounter: Payer: Self-pay | Admitting: *Deleted

## 2013-07-27 ENCOUNTER — Other Ambulatory Visit: Payer: Self-pay | Admitting: *Deleted

## 2013-07-27 ENCOUNTER — Telehealth: Payer: Self-pay | Admitting: Family Medicine

## 2013-07-27 MED ORDER — OMEGA-3-ACID ETHYL ESTERS 1 G PO CAPS: ORAL_CAPSULE | ORAL | Status: DC

## 2013-07-27 NOTE — Telephone Encounter (Signed)
Needing fish oil refilled Pharmacy is [facility] Call back number is [phone]

## 2013-07-27 NOTE — Telephone Encounter (Signed)
Med refilled.

## 2013-07-27 NOTE — Telephone Encounter (Signed)
Meds refilled.

## 2013-09-01 DIAGNOSIS — I639 Cerebral infarction, unspecified: Secondary | ICD-10-CM

## 2013-09-01 HISTORY — DX: Cerebral infarction, unspecified: I63.9

## 2013-09-24 ENCOUNTER — Other Ambulatory Visit: Payer: Self-pay | Admitting: Family Medicine

## 2013-09-26 NOTE — Telephone Encounter (Signed)
Okay to refill? 

## 2013-09-26 NOTE — Telephone Encounter (Signed)
?   Ok to refill, last refill 07/12/13, last ov 06/12/13

## 2013-10-10 ENCOUNTER — Telehealth: Payer: Self-pay | Admitting: *Deleted

## 2013-10-10 MED ORDER — TRIAMTERENE-HCTZ 37.5-25 MG PO TABS: 1.0000 | ORAL_TABLET | Freq: Every day | ORAL | Status: DC

## 2013-10-10 NOTE — Telephone Encounter (Signed)
Meds refilled.

## 2013-11-07 ENCOUNTER — Other Ambulatory Visit: Payer: Self-pay | Admitting: Family Medicine

## 2013-11-07 DIAGNOSIS — D509 Iron deficiency anemia, unspecified: Secondary | ICD-10-CM

## 2013-11-07 DIAGNOSIS — M159 Polyosteoarthritis, unspecified: Secondary | ICD-10-CM

## 2013-11-08 ENCOUNTER — Ambulatory Visit (INDEPENDENT_AMBULATORY_CARE_PROVIDER_SITE_OTHER): Payer: Medicare Other | Admitting: Family Medicine

## 2013-11-08 ENCOUNTER — Encounter: Payer: Self-pay | Admitting: Family Medicine

## 2013-11-08 VITALS — BP 144/86 | HR 78 | Temp 97.5°F | Resp 16 | Ht 62.5 in | Wt 173.0 lb

## 2013-11-08 DIAGNOSIS — Z23 Encounter for immunization: Secondary | ICD-10-CM

## 2013-11-08 DIAGNOSIS — J310 Chronic rhinitis: Secondary | ICD-10-CM

## 2013-11-08 DIAGNOSIS — J329 Chronic sinusitis, unspecified: Secondary | ICD-10-CM

## 2013-11-08 DIAGNOSIS — M159 Polyosteoarthritis, unspecified: Secondary | ICD-10-CM

## 2013-11-08 DIAGNOSIS — E785 Hyperlipidemia, unspecified: Secondary | ICD-10-CM

## 2013-11-08 DIAGNOSIS — I10 Essential (primary) hypertension: Secondary | ICD-10-CM

## 2013-11-08 LAB — CBC WITH DIFFERENTIAL/PLATELET
Basophils Absolute: 0 10*3/uL (ref 0.0–0.1)
Basophils Relative: 0 % (ref 0–1)
Eosinophils Absolute: 0.1 10*3/uL (ref 0.0–0.7)
Eosinophils Relative: 1 % (ref 0–5)
HCT: 35.4 % — ABNORMAL LOW (ref 36.0–46.0)
Hemoglobin: 12 g/dL (ref 12.0–15.0)
Lymphocytes Relative: 23 % (ref 12–46)
Lymphs Abs: 1.8 10*3/uL (ref 0.7–4.0)
MCH: 30.9 pg (ref 26.0–34.0)
MCHC: 33.9 g/dL (ref 30.0–36.0)
MCV: 91.2 fL (ref 78.0–100.0)
Monocytes Absolute: 0.4 10*3/uL (ref 0.1–1.0)
Monocytes Relative: 5 % (ref 3–12)
NEUTROS PCT: 71 % (ref 43–77)
Neutro Abs: 5.5 10*3/uL (ref 1.7–7.7)
Platelets: 225 10*3/uL (ref 150–400)
RBC: 3.88 MIL/uL (ref 3.87–5.11)
RDW: 15.1 % (ref 11.5–15.5)
WBC: 7.7 10*3/uL (ref 4.0–10.5)

## 2013-11-08 LAB — LIPID PANEL
Cholesterol: 272 mg/dL — ABNORMAL HIGH (ref 0–200)
HDL: 63 mg/dL (ref >39)
LDL Cholesterol: 189 mg/dL — ABNORMAL HIGH (ref 0–99)
Total CHOL/HDL Ratio: 4.3 Ratio
Triglycerides: 100 mg/dL (ref <150)
VLDL: 20 mg/dL (ref 0–40)

## 2013-11-08 LAB — BASIC METABOLIC PANEL
BUN: 17 mg/dL (ref 6–23)
CALCIUM: 9.7 mg/dL (ref 8.4–10.5)
CO2: 25 meq/L (ref 19–32)
Chloride: 106 mEq/L (ref 96–112)
Creat: 0.92 mg/dL (ref 0.50–1.10)
Glucose, Bld: 104 mg/dL — ABNORMAL HIGH (ref 70–99)
Potassium: 3.7 mEq/L (ref 3.5–5.3)
SODIUM: 142 meq/L (ref 135–145)

## 2013-11-08 MED ORDER — FLUTICASONE PROPIONATE 50 MCG/ACT NA SUSP: 2.0000 | Freq: Every day | NASAL | Status: DC

## 2013-11-08 MED ORDER — TRAMADOL-ACETAMINOPHEN 37.5-325 MG PO TABS: ORAL_TABLET | ORAL | Status: DC

## 2013-11-08 NOTE — Patient Instructions (Signed)
Prevnar 13 given We will call lab results Continue current medications Flonase for sinuses F/U 4 months

## 2013-11-08 NOTE — Progress Notes (Signed)
Patient ID: Dawn Church, female   DOB: Dec 15, 1927, 80 y.o.   MRN: [id number]     Subjective:    Patient ID: Dawn Church, female    DOB: 09/29/1927, 80 y.o.   MRN: [id number]  Patient presents for 4 month F/U, muscle aches and cough  patient here to follow chronic medical problems. She does complain of some cough related to postnasal drip she states that she gets pressure in both sides of her sinuses and then it starts to drip down the back and this makes her cough. She's not had any fever your or tenderness in her face. It is been going off and on for the past month or so. She has tried the Zyrtec which helps some but does not stop the drainage Continues to have muscle aches mostly when she increases her activity it is from her lower back to her knee which is where it was previously when she was seen by orthopedics. She's not had any recent falls  Hypertension-already her blood pressure medication no chest pain no shortness of breath no concerns today  Due for Prevnar 13  Review Of Systems:  GEN- denies fatigue, fever, weight loss,weakness, recent illness HEENT- denies eye drainage, change in vision, nasal discharge, CVS- denies chest pain, palpitations RESP- denies SOB, cough, wheeze ABD- denies N/V, change in stools, abd pain GU- denies dysuria, hematuria, dribbling, incontinence MSK- + joint pain, +muscle aches, injury Neuro- denies headache, dizziness, syncope, seizure activity       Objective:    BP 144/86  Pulse 78  Temp(Src) 97.5 F (36.4 C)  Resp 16  Ht 5' 2.5" (1.588 m)  Wt 173 lb (78.472 kg)  BMI 31.12 kg/m2 GEN- NAD, alert and oriented x3 HEENT- PERRL, EOMI, non injected sclera, pink conjunctiva, MMM, oropharynx clear Neck- Supple, no LAD CVS- RRR, no murmur RESP-CTAB MSK- Spine NT, neg SLR, fair ROM HIPS, KNEES, no effusion of knees, muscle NT to palpation EXT- No edema Pulses- Radial, DP- 2+        Assessment & Plan:      Problem List Items  Addressed This Visit   Rhinosinusitis     No signs of overt infection. Will have her continue the Zyrtec I will also add Flonase for the postnasal drip    Relevant Medications      cetirizine (ZYRTEC) 10 MG tablet      fluticasone (FLONASE) 50 MCG nasal spray   Hyperlipidemia   Relevant Orders      Lipid panel (Completed)   Generalized OA     She is complained constantly of this muscle aches she has been seen by orthopedics this is thought to be secondary to osteoarthritis in her back and in her knees. She does not want any intervention. I will put her back on the Ultracet which has helped    Relevant Medications      traMADol-acetaminophen (ULTRACET) 37.5-325 MG per tablet   Essential hypertension, benign - Primary     Her blood pressure looks good today continue current medications    Relevant Orders      CBC with Differential (Completed)      Basic metabolic panel (Completed)    Other Visit Diagnoses   Need for prophylactic vaccination against Streptococcus pneumoniae (pneumococcus)        Relevant Orders       Pneumococcal conjugate vaccine 13-valent (Completed)       Note: This dictation was prepared with Dragon dictation along with smaller phrase  technology. Any transcriptional errors that result from this process are unintentional.

## 2013-11-09 ENCOUNTER — Encounter: Payer: Self-pay | Admitting: Family Medicine

## 2013-11-09 DIAGNOSIS — J329 Chronic sinusitis, unspecified: Secondary | ICD-10-CM | POA: Insufficient documentation

## 2013-11-09 DIAGNOSIS — J310 Chronic rhinitis: Secondary | ICD-10-CM | POA: Insufficient documentation

## 2013-11-09 NOTE — Assessment & Plan Note (Signed)
Her blood pressure looks good today continue current medications

## 2013-11-09 NOTE — Assessment & Plan Note (Signed)
No signs of overt infection. Will have her continue the Zyrtec I will also add Flonase for the postnasal drip

## 2013-11-09 NOTE — Assessment & Plan Note (Signed)
She is complained constantly of this muscle aches she has been seen by orthopedics this is thought to be secondary to osteoarthritis in her back and in her knees. She does not want any intervention. I will put her back on the Ultracet which has helped

## 2013-11-10 ENCOUNTER — Other Ambulatory Visit: Payer: Self-pay | Admitting: *Deleted

## 2013-11-10 MED ORDER — RAMIPRIL 10 MG PO CAPS: 10.0000 mg | ORAL_CAPSULE | Freq: Every day | ORAL | Status: DC

## 2013-11-10 MED ORDER — EZETIMIBE 10 MG PO TABS: 10.0000 mg | ORAL_TABLET | Freq: Every day | ORAL | Status: DC

## 2013-11-10 NOTE — Telephone Encounter (Signed)
Refill appropriate and filled per protocol. 

## 2013-11-10 NOTE — Telephone Encounter (Signed)
Per lab results, Zetia sent to pharmacy.

## 2014-02-07 ENCOUNTER — Other Ambulatory Visit (HOSPITAL_COMMUNITY): Payer: Self-pay | Admitting: Internal Medicine

## 2014-02-07 NOTE — Telephone Encounter (Signed)
Per Dr.Rehman may fill with 5 additional refills. 

## 2014-03-07 ENCOUNTER — Ambulatory Visit (INDEPENDENT_AMBULATORY_CARE_PROVIDER_SITE_OTHER): Payer: Medicare Other | Admitting: Family Medicine

## 2014-03-07 ENCOUNTER — Encounter: Payer: Self-pay | Admitting: Family Medicine

## 2014-03-07 VITALS — BP 134/82 | HR 64 | Temp 98.0°F | Resp 12 | Ht 62.5 in | Wt 173.0 lb

## 2014-03-07 DIAGNOSIS — L820 Inflamed seborrheic keratosis: Secondary | ICD-10-CM

## 2014-03-07 DIAGNOSIS — I10 Essential (primary) hypertension: Secondary | ICD-10-CM

## 2014-03-07 DIAGNOSIS — E785 Hyperlipidemia, unspecified: Secondary | ICD-10-CM

## 2014-03-07 DIAGNOSIS — M8949 Other hypertrophic osteoarthropathy, multiple sites: Secondary | ICD-10-CM

## 2014-03-07 DIAGNOSIS — H9193 Unspecified hearing loss, bilateral: Secondary | ICD-10-CM

## 2014-03-07 DIAGNOSIS — M150 Primary generalized (osteo)arthritis: Secondary | ICD-10-CM

## 2014-03-07 DIAGNOSIS — M159 Polyosteoarthritis, unspecified: Secondary | ICD-10-CM

## 2014-03-07 DIAGNOSIS — H9190 Unspecified hearing loss, unspecified ear: Secondary | ICD-10-CM

## 2014-03-07 LAB — COMPREHENSIVE METABOLIC PANEL
ALK PHOS: 72 U/L (ref 39–117)
ALT: 18 U/L (ref 0–35)
AST: 26 U/L (ref 0–37)
Albumin: 4 g/dL (ref 3.5–5.2)
BUN: 19 mg/dL (ref 6–23)
CO2: 26 mEq/L (ref 19–32)
Calcium: 10 mg/dL (ref 8.4–10.5)
Chloride: 104 mEq/L (ref 96–112)
Creat: 1.15 mg/dL — ABNORMAL HIGH (ref 0.50–1.10)
Glucose, Bld: 102 mg/dL — ABNORMAL HIGH (ref 70–99)
POTASSIUM: 4.3 meq/L (ref 3.5–5.3)
SODIUM: 142 meq/L (ref 135–145)
TOTAL PROTEIN: 6.4 g/dL (ref 6.0–8.3)
Total Bilirubin: 0.5 mg/dL (ref 0.2–1.2)

## 2014-03-07 LAB — LIPID PANEL
Cholesterol: 219 mg/dL — ABNORMAL HIGH (ref 0–200)
HDL: 61 mg/dL (ref >39)
LDL CALC: 138 mg/dL — AB (ref 0–99)
Total CHOL/HDL Ratio: 3.6 Ratio
Triglycerides: 100 mg/dL (ref <150)
VLDL: 20 mg/dL (ref 0–40)

## 2014-03-07 MED ORDER — FLUTICASONE PROPIONATE 50 MCG/ACT NA SUSP: 2.0000 | Freq: Every day | NASAL | Status: DC

## 2014-03-07 MED ORDER — OMEGA-3-ACID ETHYL ESTERS 1 G PO CAPS: ORAL_CAPSULE | ORAL | Status: DC

## 2014-03-07 MED ORDER — RAMIPRIL 10 MG PO CAPS: 10.0000 mg | ORAL_CAPSULE | Freq: Every day | ORAL | Status: DC

## 2014-03-07 MED ORDER — PANTOPRAZOLE SODIUM 40 MG PO TBEC: DELAYED_RELEASE_TABLET | ORAL | Status: DC

## 2014-03-07 MED ORDER — TRAMADOL-ACETAMINOPHEN 37.5-325 MG PO TABS: ORAL_TABLET | ORAL | Status: DC

## 2014-03-07 MED ORDER — TRIAMTERENE-HCTZ 37.5-25 MG PO TABS: 1.0000 | ORAL_TABLET | Freq: Every day | ORAL | Status: DC

## 2014-03-07 MED ORDER — EZETIMIBE 10 MG PO TABS: 10.0000 mg | ORAL_TABLET | Freq: Every day | ORAL | Status: DC

## 2014-03-07 NOTE — Patient Instructions (Signed)
Continue current medications Keep area clean and dry from freezing We will call with lab results F/U 4 months

## 2014-03-07 NOTE — Progress Notes (Signed)
Patient ID: Dawn Church, female   DOB: [birth year], 80 y.o.   MRN: [id number]   Subjective:    Patient ID: Dawn Church, female    DOB: [birth year], 80 y.o.   MRN: [id number]  Patient presents for 4 month F/U, Arthritis Pain and Moles to L side of abdomen/ under L breast  Pt here to f/u chronic medical problems, continues to suffer with OA, ultram helps, request refill.  Concerned about moles beneath her breast and on her abdomen, states they look like growths and irritate on her abdomen, no irritation beneath breast.   Hyperlipidemia- tolerating zetia, due for repeat las  HTN- tolerating BP meds, no concerns  Hearing loss- concerned her hearing is getting worse , cant make out voices when there is background noise, cant hear whishpering as well.   Review Of Systems:  GEN- denies fatigue, fever, weight loss,weakness, recent illness HEENT- denies eye drainage, change in vision, nasal discharge, CVS- denies chest pain, palpitations RESP- denies SOB, cough, wheeze ABD- denies N/V, change in stools, abd pain GU- denies dysuria, hematuria, dribbling, incontinence MSK- +joint pain, muscle aches, injury Neuro- denies headache, dizziness, syncope, seizure activity       Objective:    BP 134/82  Pulse 64  Temp(Src) 98 F (36.7 C) (Oral)  Resp 12  Ht 5' 2.5" (1.588 m)  Wt 173 lb (78.472 kg)  BMI 31.12 kg/m2 GEN- NAD, alert and oriented x3 HEENT- PERRL, EOMI, non injected sclera, pink conjunctiva, MMM, TM clear no wax impaction,  oropharynx clear Neck- Supple,  CVS- RRR, no murmur RESP-CTAB ABD-NABS,soft,NT,ND Skin- hyperpigmented keratoses on left upper abdomen, beneath breast, few moles  EXT- No edema Pulses- Radial, DP- 2+        Assessment & Plan:      Problem List Items Addressed This Visit   OA (osteoarthritis)   Hyperlipidemia   Relevant Orders      Comprehensive metabolic panel      Lipid panel   Essential hypertension, benign - Primary      Note: This  dictation was prepared with Dragon dictation along with smaller phrase technology. Any transcriptional errors that result from this process are unintentional.

## 2014-03-08 DIAGNOSIS — H9190 Unspecified hearing loss, unspecified ear: Secondary | ICD-10-CM | POA: Insufficient documentation

## 2014-03-08 DIAGNOSIS — L820 Inflamed seborrheic keratosis: Secondary | ICD-10-CM | POA: Insufficient documentation

## 2014-03-08 NOTE — Assessment & Plan Note (Signed)
Referral to ENT, pt wants hearing aides

## 2014-03-08 NOTE — Assessment & Plan Note (Signed)
Well controlled 

## 2014-03-08 NOTE — Assessment & Plan Note (Signed)
Pt declined cryo to lesions beneath breast Cryotheray- liquid nitrogen to lesion on left upper abdomen, application until white discoloration for about 10 seconds, x 2 , bandage applied

## 2014-03-08 NOTE — Assessment & Plan Note (Signed)
Recheck LFT and lipids  

## 2014-03-08 NOTE — Assessment & Plan Note (Signed)
Continue ultracet.  

## 2014-03-09 ENCOUNTER — Encounter: Payer: Self-pay | Admitting: *Deleted

## 2014-04-04 ENCOUNTER — Other Ambulatory Visit: Payer: Self-pay | Admitting: *Deleted

## 2014-04-04 MED ORDER — FLUTICASONE PROPIONATE 50 MCG/ACT NA SUSP: 2.0000 | Freq: Every day | NASAL | Status: DC

## 2014-04-04 NOTE — Telephone Encounter (Signed)
Received fax requesting refill on Flonase with 90 days supply.   Refill appropriate and filled per protocol.

## 2014-04-05 ENCOUNTER — Other Ambulatory Visit (INDEPENDENT_AMBULATORY_CARE_PROVIDER_SITE_OTHER): Payer: Self-pay | Admitting: Internal Medicine

## 2014-04-05 DIAGNOSIS — K219 Gastro-esophageal reflux disease without esophagitis: Secondary | ICD-10-CM

## 2014-04-05 MED ORDER — PANTOPRAZOLE SODIUM 40 MG PO TBEC: DELAYED_RELEASE_TABLET | ORAL | Status: DC

## 2014-04-06 ENCOUNTER — Ambulatory Visit (INDEPENDENT_AMBULATORY_CARE_PROVIDER_SITE_OTHER): Payer: Medicare Other | Admitting: Otolaryngology

## 2014-04-13 ENCOUNTER — Other Ambulatory Visit: Payer: Self-pay | Admitting: Family Medicine

## 2014-04-13 DIAGNOSIS — Z1231 Encounter for screening mammogram for malignant neoplasm of breast: Secondary | ICD-10-CM

## 2014-04-14 ENCOUNTER — Other Ambulatory Visit: Payer: Self-pay | Admitting: Family Medicine

## 2014-04-15 NOTE — Telephone Encounter (Signed)
Prescription was called to pharmacy on 03/07/2014, #90, 2 refills.   Call placed to pharmacy. Refills remain.   Refill denied.

## 2014-04-20 ENCOUNTER — Telehealth: Payer: Self-pay | Admitting: Family Medicine

## 2014-04-20 NOTE — Telephone Encounter (Signed)
Call placed to patient.   Patient states that she has been having constipation, dizziness, cold chills. States that she has been out of the Ultracet x5 days and she fills like she is withdrawing from the medication.   Reports that she is scared to take the meds now because she worries that it may happen again.   Advised to not take the medication if she is concerned.   MD to be made aware.

## 2014-04-20 NOTE — Telephone Encounter (Signed)
[phone]  Pt has a question about the medication traMADol-acetaminophen (ULTRACET) 37.5-325 MG per tablet She has a fear of the side effects (no trouble breathing) are effecting her.

## 2014-04-21 NOTE — Telephone Encounter (Signed)
Pain medication can given some of this symptoms if she is taking more than once a day and she misses a few days. Triage and make sure she is not having an acute illness- if so schedule visit if needed  If not she can restart the ultracet or only take it for severe pain and not every day, her other option is taking EX Tylenol for pain.

## 2014-04-21 NOTE — Telephone Encounter (Signed)
Call placed to patient to make aware.   No acute illness noted with description.   States that she is feeling better, and she does not want to continue Ultracet.

## 2014-04-27 ENCOUNTER — Ambulatory Visit (INDEPENDENT_AMBULATORY_CARE_PROVIDER_SITE_OTHER): Payer: Medicare Other | Admitting: Otolaryngology

## 2014-04-27 DIAGNOSIS — H903 Sensorineural hearing loss, bilateral: Secondary | ICD-10-CM

## 2014-05-17 ENCOUNTER — Ambulatory Visit (HOSPITAL_COMMUNITY)
Admission: RE | Admit: 2014-05-17 | Discharge: 2014-05-17 | Disposition: A | Discharge status: Home / Self Care | Payer: Medicare Other | Source: Ambulatory Visit | Attending: Family Medicine | Admitting: Family Medicine

## 2014-05-17 DIAGNOSIS — Z1231 Encounter for screening mammogram for malignant neoplasm of breast: Secondary | ICD-10-CM | POA: Insufficient documentation

## 2014-05-26 ENCOUNTER — Ambulatory Visit (INDEPENDENT_AMBULATORY_CARE_PROVIDER_SITE_OTHER): Payer: Medicare Other | Admitting: Family Medicine

## 2014-05-26 ENCOUNTER — Encounter: Payer: Self-pay | Admitting: Family Medicine

## 2014-05-26 VITALS — BP 140/84 | HR 64 | Temp 97.9°F | Resp 12 | Ht 62.0 in | Wt 173.0 lb

## 2014-05-26 DIAGNOSIS — Z23 Encounter for immunization: Secondary | ICD-10-CM

## 2014-05-26 DIAGNOSIS — M159 Polyosteoarthritis, unspecified: Secondary | ICD-10-CM

## 2014-05-26 DIAGNOSIS — Z0000 Encounter for general adult medical examination without abnormal findings: Secondary | ICD-10-CM

## 2014-05-26 DIAGNOSIS — I10 Essential (primary) hypertension: Secondary | ICD-10-CM

## 2014-05-26 DIAGNOSIS — E785 Hyperlipidemia, unspecified: Secondary | ICD-10-CM

## 2014-05-26 MED ORDER — DICLOFENAC SODIUM 1 % TD GEL: TRANSDERMAL | Status: DC

## 2014-05-26 NOTE — Assessment & Plan Note (Signed)
Blood pressure well controlled

## 2014-05-26 NOTE — Assessment & Plan Note (Signed)
Trial of Voltaren Gel

## 2014-05-26 NOTE — Progress Notes (Signed)
Patient ID: Dawn Church, female   DOB: 1928/08/02, 80 y.o.   MRN: [id number] Subjective:   Patient presents for Medicare Annual/Subsequent preventive examination.   Review Past Medical/Family/Social: Per EMR Patient here for annual wellness exam. He continues to have problems with her arthritis of felt like she had withdrawals when she missed a few days of her tramadol she did try to take it again and made her sick therefore she does not want to use this medication. She is fearful of eating taking Tylenol. She would like to try something different. She did see ear nose and throat regarding her hearing and they didn't recommend a hearing aide however she is holding off on this.  I reviewed her [place] health care nurse visit she does not want to proceed with shingles or tetanus booster as she knows they are not covered  Risk Factors  Current exercise habits: None Dietary issues discussed: None  Cardiac risk factors: Obesity (BMI >= 30 kg/m2).   Depression Screen  (Note: if answer to either of the following is "Yes", a more complete depression screening is indicated)  Over the past two weeks, have you felt down, depressed or hopeless? No Over the past two weeks, have you felt little interest or pleasure in doing things? No Have you lost interest or pleasure in daily life? No Do you often feel hopeless? No Do you cry easily over simple problems? No   Activities of Daily Living  In your present state of health, do you have any difficulty performing the following activities?:  Driving? No  Managing money? No  Feeding yourself? No  Getting from bed to chair? No  Climbing a flight of stairs? yes  Preparing food and eating?: No  Bathing or showering? No  Getting dressed: No  Getting to the toilet? No  Using the toilet:No  Moving around from place to place:Yes/ No  In the past year have you fallen or had a near fall?:No  Are you sexually active? No  Do you have more than one partner? No    Hearing Difficulties: No  Do you often ask people to speak up or repeat themselves? yes Do you experience ringing or noises in your ears? No Do you have difficulty understanding soft or whispered voices? yes  Do you feel that you have a problem with memory? No Do you often misplace items? No  Do you feel safe at home? Yes  Cognitive Testing  Alert? Yes Normal Appearance?Yes  Oriented to person? Yes Place? Yes  Time? Yes  Recall of three objects? Yes  Can perform simple calculations? Yes  Displays appropriate judgment?Yes  Can read the correct time from a watch face?Yes   List the Names of Other Physician/Practitioners you currently use: Orthopedics as needed    Screening Tests / Date                Zostavax - declines Mammogram - UTD Influenza Vaccine - Given  Tetanus/tdap- financial  GEN- denies fatigue, fever, weight loss,weakness, recent illness HEENT- denies eye drainage, change in vision, nasal discharge, CVS- denies chest pain, palpitations RESP- denies SOB, cough, wheeze ABD- denies N/V, change in stools, abd pain GU- denies dysuria, hematuria, dribbling, incontinence MSK- + joint pain, muscle aches, injury Neuro- denies headache, dizziness, syncope, seizure activity   PHYSICAL GEN- NAD, alert and oriented x3 HEENT- PERRL, EOMI, non injected sclera, pink conjunctiva, MMM, oropharynx clear Neck- Supple, no thryomegaly, no bruit CVS- RRR, no murmur RESP-CTAB EXT- No edema  Pulses- Radial, DP- 2+   Assessment:    Annual wellness medicare exam   Plan:    During the course of the visit the patient was educated and counseled about appropriate screening and preventive services including:    Screen neg for depression. Diet review for nutrition referral? Yes ____ Not Indicated __x__  Patient Instructions (the written plan) was given to the patient.  Medicare Attestation  I have personally reviewed:  The patient's medical and social history  Their use of  alcohol, tobacco or illicit drugs  Their current medications and supplements  The patient's functional ability including ADLs,fall risks, home safety risks, cognitive, and hearing and visual impairment  Diet and physical activities  Evidence for depression or mood disorders  The patient's weight, height, BMI, and visual acuity have been recorded in the chart. I have made referrals, counseling, and provided education to the patient based on review of the above and I have provided the patient with a written personalized care plan for preventive services.

## 2014-05-26 NOTE — Patient Instructions (Addendum)
Try the voltaren gel for your joints Flu shot given  Stop the ultram Get the labs done before next visit Bone Density to be scheduled F/U Change follow-up to 4 months

## 2014-06-26 ENCOUNTER — Ambulatory Visit (INDEPENDENT_AMBULATORY_CARE_PROVIDER_SITE_OTHER): Payer: Medicare Other | Admitting: Family Medicine

## 2014-06-26 ENCOUNTER — Encounter: Payer: Self-pay | Admitting: Family Medicine

## 2014-06-26 VITALS — BP 180/92 | HR 78 | Temp 98.0°F | Resp 14 | Ht 62.0 in | Wt 175.0 lb

## 2014-06-26 DIAGNOSIS — I10 Essential (primary) hypertension: Secondary | ICD-10-CM

## 2014-06-26 DIAGNOSIS — I639 Cerebral infarction, unspecified: Secondary | ICD-10-CM

## 2014-06-26 DIAGNOSIS — G8190 Hemiplegia, unspecified affecting unspecified side: Secondary | ICD-10-CM

## 2014-06-26 DIAGNOSIS — G8194 Hemiplegia, unspecified affecting left nondominant side: Secondary | ICD-10-CM

## 2014-06-26 LAB — CBC WITH DIFFERENTIAL/PLATELET
BASOS ABS: 0 10*3/uL (ref 0.0–0.1)
BASOS PCT: 0 % (ref 0–1)
EOS PCT: 1 % (ref 0–5)
Eosinophils Absolute: 0.1 10*3/uL (ref 0.0–0.7)
HEMATOCRIT: 36.5 % (ref 36.0–46.0)
HEMOGLOBIN: 12.3 g/dL (ref 12.0–15.0)
Lymphocytes Relative: 23 % (ref 12–46)
Lymphs Abs: 1.4 10*3/uL (ref 0.7–4.0)
MCH: 31.1 pg (ref 26.0–34.0)
MCHC: 33.7 g/dL (ref 30.0–36.0)
MCV: 92.2 fL (ref 78.0–100.0)
Monocytes Absolute: 0.4 10*3/uL (ref 0.1–1.0)
Monocytes Relative: 6 % (ref 3–12)
Neutro Abs: 4.3 10*3/uL (ref 1.7–7.7)
Neutrophils Relative %: 70 % (ref 43–77)
PLATELETS: 214 10*3/uL (ref 150–400)
RBC: 3.96 MIL/uL (ref 3.87–5.11)
RDW: 14.5 % (ref 11.5–15.5)
WBC: 6.1 10*3/uL (ref 4.0–10.5)

## 2014-06-26 LAB — COMPREHENSIVE METABOLIC PANEL
ALT: 21 U/L (ref 0–35)
AST: 25 U/L (ref 0–37)
Albumin: 4.2 g/dL (ref 3.5–5.2)
Alkaline Phosphatase: 74 U/L (ref 39–117)
BILIRUBIN TOTAL: 0.5 mg/dL (ref 0.2–1.2)
BUN: 19 mg/dL (ref 6–23)
CO2: 28 meq/L (ref 19–32)
Calcium: 10.2 mg/dL (ref 8.4–10.5)
Chloride: 104 mEq/L (ref 96–112)
Creat: 1.12 mg/dL — ABNORMAL HIGH (ref 0.50–1.10)
Glucose, Bld: 94 mg/dL (ref 70–99)
Potassium: 4.1 mEq/L (ref 3.5–5.3)
Sodium: 142 mEq/L (ref 135–145)
Total Protein: 6.9 g/dL (ref 6.0–8.3)

## 2014-06-26 LAB — LIPID PANEL
CHOLESTEROL: 224 mg/dL — AB (ref 0–200)
HDL: 68 mg/dL (ref >39)
LDL Cholesterol: 123 mg/dL — ABNORMAL HIGH (ref 0–99)
Total CHOL/HDL Ratio: 3.3 Ratio
Triglycerides: 164 mg/dL — ABNORMAL HIGH (ref <150)
VLDL: 33 mg/dL (ref 0–40)

## 2014-06-26 MED ORDER — RAMIPRIL 10 MG PO CAPS: 10.0000 mg | ORAL_CAPSULE | Freq: Two times a day (BID) | ORAL | Status: DC

## 2014-06-26 NOTE — Patient Instructions (Signed)
MRI of brain to be done Take Aspirin 325mg  ( you can take 3 of the 81mg ) Increase your ramipril to 1 tablet twice a day  No driving , no heavy lifting F/U pending results

## 2014-06-27 NOTE — Progress Notes (Signed)
Patient ID: Dawn Church, female   DOB: 1928/08/03, 80 y.o.   MRN: [id number]   Subjective:    Patient ID: Dawn Church, female    DOB: 1928-07-15, 80 y.o.   MRN: [id number]  Patient presents for L Leg Arthritis Pain  patient here with her daughter. About 2 weeks ago, note this dates Changing with regards to patient she noticed weakness in her left arm as well as the left lower extremity that was not there previously. Her daughters noted that she was actually swinging her left leg in a circle when she walked which was new. She states that she went to sleep and woke up that way. She denies any chest pain shortness of breath. She's been taking her blood pressure medicine as prescribed. Denies any pain in her left leg despite the new limp, her arthritis pain is still on the right side    Review Of Systems:  GEN- denies fatigue, fever, weight loss,weakness, recent illness HEENT- denies eye drainage, change in vision, nasal discharge, CVS- denies chest pain, palpitations RESP- denies SOB, cough, wheeze ABD- denies N/V, change in stools, abd pain GU- denies dysuria, hematuria, dribbling, incontinence MSK- +joint pain, muscle aches, injury Neuro- denies headache, dizziness, syncope, seizure activity       Objective:    BP 180/92  Pulse 78  Temp(Src) 98 F (36.7 C) (Oral)  Resp 14  Ht 5\' 2"  (1.575 m)  Wt 175 lb (79.379 kg)  BMI 32.00 kg/m2 GEN- NAD, alert and oriented x3 HEENT- PERRL, EOMI, non injected sclera, pink conjunctiva, MMM, oropharynx clear Neck- Supple, no bruit CVS- RRR, no murmur RESP-CTAB ABD-NABS,soft,NT,ND Neuro- unsteady gait- swings left foot around to place, 4/5 LUE compared to right,? mild facial droop right side, normal speech, mild ataxia, CNII-XII in tact EXT- No edema Pulses- Radial 2+        Assessment & Plan:      Problem List Items Addressed This Visit   None    Visit Diagnoses   Cerebral infarction due to unspecified mechanism    -   Primary    I'm concerned for acute stroke this would've been a least 10-14 days ago. I'll increase her to aspirin 325 mg. I will obtain MRI of brain and MRA.  Plan for PT, check fasting labs and lipids today, will need statin drug      Relevant Orders       MR Brain Wo Contrast       MR MRA Head/Brain Wo Cm    Left hemiparesis        Relevant Orders       MR Brain Wo Contrast       MR MRA Head/Brain Wo Cm       Note: This dictation was prepared with Dragon dictation along with smaller phrase technology. Any transcriptional errors that result from this process are unintentional.

## 2014-06-27 NOTE — Assessment & Plan Note (Signed)
Increase ramipril to 10mg  BID, continue  maxizde

## 2014-06-30 ENCOUNTER — Ambulatory Visit (HOSPITAL_COMMUNITY)
Admission: RE | Admit: 2014-06-30 | Discharge: 2014-06-30 | Disposition: A | Discharge status: Home / Self Care | Payer: Medicare Other | Source: Ambulatory Visit | Attending: Family Medicine | Admitting: Family Medicine

## 2014-06-30 DIAGNOSIS — I6782 Cerebral ischemia: Secondary | ICD-10-CM | POA: Insufficient documentation

## 2014-06-30 DIAGNOSIS — I639 Cerebral infarction, unspecified: Secondary | ICD-10-CM

## 2014-06-30 DIAGNOSIS — G8194 Hemiplegia, unspecified affecting left nondominant side: Secondary | ICD-10-CM

## 2014-06-30 DIAGNOSIS — I672 Cerebral atherosclerosis: Secondary | ICD-10-CM | POA: Insufficient documentation

## 2014-06-30 DIAGNOSIS — I69354 Hemiplegia and hemiparesis following cerebral infarction affecting left non-dominant side: Secondary | ICD-10-CM | POA: Diagnosis not present

## 2014-07-03 ENCOUNTER — Encounter: Payer: Self-pay | Admitting: Family Medicine

## 2014-07-03 ENCOUNTER — Other Ambulatory Visit: Payer: Self-pay | Admitting: Family Medicine

## 2014-07-03 DIAGNOSIS — G8194 Hemiplegia, unspecified affecting left nondominant side: Secondary | ICD-10-CM

## 2014-07-03 DIAGNOSIS — I63529 Cerebral infarction due to unspecified occlusion or stenosis of unspecified anterior cerebral artery: Secondary | ICD-10-CM

## 2014-07-03 DIAGNOSIS — R2681 Unsteadiness on feet: Secondary | ICD-10-CM

## 2014-07-03 MED ORDER — ATORVASTATIN CALCIUM 10 MG PO TABS: 10.0000 mg | ORAL_TABLET | Freq: Every day | ORAL | Status: DC

## 2014-07-05 ENCOUNTER — Encounter: Payer: Self-pay | Admitting: Family Medicine

## 2014-07-05 ENCOUNTER — Ambulatory Visit (INDEPENDENT_AMBULATORY_CARE_PROVIDER_SITE_OTHER): Payer: Medicare Other | Admitting: Family Medicine

## 2014-07-05 VITALS — BP 170/96 | HR 76 | Temp 98.2°F | Resp 14 | Ht 62.0 in | Wt 172.0 lb

## 2014-07-05 DIAGNOSIS — I639 Cerebral infarction, unspecified: Secondary | ICD-10-CM

## 2014-07-05 DIAGNOSIS — I10 Essential (primary) hypertension: Secondary | ICD-10-CM

## 2014-07-05 DIAGNOSIS — Z8673 Personal history of transient ischemic attack (TIA), and cerebral infarction without residual deficits: Secondary | ICD-10-CM | POA: Insufficient documentation

## 2014-07-05 MED ORDER — AMLODIPINE BESYLATE 2.5 MG PO TABS: ORAL_TABLET | ORAL | Status: DC

## 2014-07-05 NOTE — Assessment & Plan Note (Signed)
He'll start physical therapy on the 17th. She will continue aspirin 325 mg we need to improve her blood pressure control I will add additional amlodipine 2.5 mg. I will check her renal function today due to the increase in the Ramipril The family will monitor her blood pressure at home. Carotid ultrasound is also scheduled On lipitor

## 2014-07-05 NOTE — Patient Instructions (Signed)
Start norvasc take in the morning  Continue ramipril twice a day, take evening pill around 8pm Physical therapy  Ultrasound of neck to be done Take blood pressure everyday and record F/u by phone on Friday  F/U 2 months

## 2014-07-05 NOTE — Assessment & Plan Note (Signed)
Uncontrolled per above

## 2014-07-05 NOTE — Progress Notes (Signed)
Patient ID: Dawn Church, female   DOB: Apr 05, 1928, 80 y.o.   MRN: [id number]   Subjective:    Patient ID: Dawn Church, female    DOB: Aug 13, 1928, 80 y.o.   MRN: [id number]  Patient presents for F/U Stroke  Patient here for follow-up she had an MRI done last week after she presented with weakness on her left side and gait abnormality on the left side. MRI of the brain showed subacute right external capsular stroke. She is on aspirin 325 mg now I also increased  Her Ramipril to 10 mg twice a day in addition to the Maxzide she was already taken however blood pressures still quite elevated today. She has not been monitoring her blood pressure at home. She was also started on Lipitor 10 mg which she agreed to take   Review Of Systems:  GEN- denies fatigue, fever, weight loss,weakness, recent illness HEENT- denies eye drainage, change in vision, nasal discharge, CVS- denies chest pain, palpitations RESP- denies SOB, cough, wheeze ABD- denies N/V, change in stools, abd pain GU- denies dysuria, hematuria, dribbling, incontinence MSK- denies joint pain, muscle aches, injury Neuro- denies headache, dizziness, syncope, seizure activity       Objective:    BP 170/96 mmHg  Pulse 76  Temp(Src) 98.2 F (36.8 C) (Oral)  Resp 14  Ht 5\' 2"  (1.575 m)  Wt 172 lb (78.019 kg)  BMI 31.45 kg/m2 GEN- NAD, alert and oriented x3 HEENT- PERRL, EOMI, non injected sclera, pink conjunctiva, MMM, oropharynx clear CVS- RRR, no murmur RESP-CTAB ABD-NABS,soft,NT,ND Neuro- unsteady gait- swings left foot around to place, 4/5 LUE compared to right,mild facial droop right side, normal speech, mild ataxia, CNII-XII in tact EXT- No edema Pulses- Radial 2+       Assessment & Plan:      Problem List Items Addressed This Visit    Essential hypertension, benign   Relevant Medications      amLODIpine (NORVASC)  tablet   Other Relevant Orders      Basic metabolic panel   CVA (cerebral vascular  accident) - Primary   Relevant Medications      amLODIpine (NORVASC)  tablet      Note: This dictation was prepared with Dragon dictation along with smaller phrase technology. Any transcriptional errors that result from this process are unintentional.

## 2014-07-06 LAB — BASIC METABOLIC PANEL
BUN: 18 mg/dL (ref 6–23)
CALCIUM: 9.6 mg/dL (ref 8.4–10.5)
CO2: 26 mEq/L (ref 19–32)
Chloride: 102 mEq/L (ref 96–112)
Creat: 1.08 mg/dL (ref 0.50–1.10)
Glucose, Bld: 103 mg/dL — ABNORMAL HIGH (ref 70–99)
Potassium: 4 mEq/L (ref 3.5–5.3)
Sodium: 140 mEq/L (ref 135–145)

## 2014-07-07 ENCOUNTER — Telehealth: Payer: Self-pay | Admitting: *Deleted

## 2014-07-07 NOTE — Telephone Encounter (Signed)
Pt has appointment for her carotid US duplex at [facility] on Monday Nov 9 at 12:45pm, I contacted pts daughter Bethena Roys Odds to return my call and also contacted pt with appointment information.

## 2014-07-10 ENCOUNTER — Ambulatory Visit (HOSPITAL_COMMUNITY)
Admission: RE | Admit: 2014-07-10 | Discharge: 2014-07-10 | Disposition: A | Discharge status: Home / Self Care | Payer: Medicare Other | Source: Ambulatory Visit | Attending: Family Medicine | Admitting: Family Medicine

## 2014-07-10 DIAGNOSIS — R531 Weakness: Secondary | ICD-10-CM | POA: Diagnosis not present

## 2014-07-10 DIAGNOSIS — I10 Essential (primary) hypertension: Secondary | ICD-10-CM | POA: Insufficient documentation

## 2014-07-10 DIAGNOSIS — I63529 Cerebral infarction due to unspecified occlusion or stenosis of unspecified anterior cerebral artery: Secondary | ICD-10-CM | POA: Insufficient documentation

## 2014-07-11 ENCOUNTER — Ambulatory Visit: Payer: Medicare Other | Admitting: Family Medicine

## 2014-07-12 ENCOUNTER — Telehealth: Payer: Self-pay | Admitting: Family Medicine

## 2014-07-12 NOTE — Telephone Encounter (Signed)
Please see result notes.  

## 2014-07-12 NOTE — Telephone Encounter (Signed)
BP improved, continue current meds

## 2014-07-12 NOTE — Telephone Encounter (Signed)
Patient is calling to get results of her ultrasound done on Monday  [phone]

## 2014-07-12 NOTE — Telephone Encounter (Signed)
-----   Message from [place], LPN sent at 35/45/6256  4:45 PM EST ----- Regarding: RE: f/u BP results Call placed to patient.   States that her BP's have been running in the 140's/ 80's. States that her BP this morning noted at 143/87.  MD to be made aware.  ----- Message -----    From: Alycia Rossetti, MD    Sent: 07/07/2014   8:01 AM      To: [place] Lathe Six, LPN Subject: FW: f/u BP results                             Please call and see what her blood pressure has been running, should be on maxide, ramipril, norvasc  Labwork was normal   ----- Message -----    From: Alycia Rossetti, MD    Sent: 07/07/2014      To: Alycia Rossetti, MD Subject: f/u BP results

## 2014-07-18 ENCOUNTER — Encounter (HOSPITAL_COMMUNITY): Payer: Self-pay

## 2014-07-18 ENCOUNTER — Ambulatory Visit (HOSPITAL_COMMUNITY)
Admission: RE | Admit: 2014-07-18 | Discharge: 2014-07-18 | Disposition: A | Discharge status: Home / Self Care | Payer: Medicare Other | Source: Ambulatory Visit | Attending: Family Medicine | Admitting: Family Medicine

## 2014-07-18 DIAGNOSIS — R262 Difficulty in walking, not elsewhere classified: Secondary | ICD-10-CM | POA: Insufficient documentation

## 2014-07-18 DIAGNOSIS — M25652 Stiffness of left hip, not elsewhere classified: Secondary | ICD-10-CM | POA: Diagnosis not present

## 2014-07-18 DIAGNOSIS — R269 Unspecified abnormalities of gait and mobility: Secondary | ICD-10-CM

## 2014-07-18 DIAGNOSIS — I69893 Ataxia following other cerebrovascular disease: Secondary | ICD-10-CM | POA: Diagnosis not present

## 2014-07-18 DIAGNOSIS — M6281 Muscle weakness (generalized): Secondary | ICD-10-CM | POA: Diagnosis not present

## 2014-07-18 DIAGNOSIS — IMO0002 Reserved for concepts with insufficient information to code with codable children: Secondary | ICD-10-CM

## 2014-07-18 DIAGNOSIS — R29818 Other symptoms and signs involving the nervous system: Secondary | ICD-10-CM

## 2014-07-18 DIAGNOSIS — R531 Weakness: Secondary | ICD-10-CM

## 2014-07-18 DIAGNOSIS — Z5189 Encounter for other specified aftercare: Secondary | ICD-10-CM | POA: Diagnosis present

## 2014-07-18 DIAGNOSIS — R29898 Other symptoms and signs involving the musculoskeletal system: Secondary | ICD-10-CM

## 2014-07-18 NOTE — Patient Instructions (Signed)
  Coordination Activities  Perform the following activities for 3-5 minutes multiple times per day with left hand(s).   Rotate ball in fingertips (clockwise and counter-clockwise).  Toss ball in air and catch with the same hand.  Shuffle cards.  Pick up coins, buttons, marbles, dried beans/pasta of different sizes and place in container.  Pick up coins and stack.  Pick up coins one at a time until you get 5-10 in your hand, then move coins from palm to fingertips to stack one at a time.   Lift fingers and thumb off table one at a time. Increase speed as able. (10 times)  Touch thumb to each fingertip. Increase speed as able. (10 times)  Thread buttons or beads onto a string.    Bea Graff Darl Brisbin, MS, OTR/L [place]

## 2014-07-18 NOTE — Patient Instructions (Signed)
5x sit to stand

## 2014-07-18 NOTE — Therapy (Signed)
Occupational Therapy Evaluation  Patient Details  Name: Dawn Church MRN: [id number] Date of Birth: 08-Jun-1928  Encounter Date: 07/18/2014      OT End of Session - 07/18/14 1557    Visit Number 1   Number of Visits 8   Date for OT Re-Evaluation 08/15/14   Authorization Type [employer] Medicare Complete   Authorization Time Period Before 10th visit   Authorization - Visit Number 1   Authorization - Number of Visits 10   OT Start Time 1442   OT Stop Time 1515   OT Time Calculation (min) 33 min   Activity Tolerance Patient tolerated treatment well   Behavior During Therapy [facility] for tasks assessed/performed      Past Medical History  Diagnosis Date  . TIA (transient ischemic attack)   . Epigastric pain   . Stomach ulcer   . Hemochromatosis   . Anemia   . CVA (cerebral infarction)   . Hypercholesteremia   . Hypertension   . Hx of seasonal allergies     Past Surgical History  Procedure Laterality Date  . Vaginal hysterectomy    . Appendectomy    . Esophagogastroduodenoscopy  12/20/2010  . Breast biopsy      Bilateral  . Breast biopsy      bilateral  . Shoulder open rotator cuff repair      right   . Cataract extraction, bilateral      There were no vitals taken for this visit.  Visit Diagnosis:  Lack of coordination due to stroke  Weakness generalized      Subjective Assessment - 07/18/14 1535    Symptoms S: "I just thought it was my arthritis... and then i thought my left shoe had gottena ll stretched out. I didn't go to the hospital."   Pertinent History Pt is 80 yo female presenting to outpatient OT s/p CVA in early October (pt unable to provide specific date).  Pt first noticed increased feeling of abnormality in L arm during night, but attributed sensations to athritis.  When walking the next day both pt and daughter noticed pt swinging her LLE.  Pt presented to Dr. Jeanice Lim, and [facility] and additional tests at hospital.  Pt has been daking daily BPs and  reproting to Dr. Jeanice Lim.  pt is not presenting for occupational therapy for decreased strength and coordination in her LUE.   Limitations Lifting, driving   Special Tests QuckDASH: 63.63   Patient Stated Goals "to be able to do so the children don't have to for me."   Currently in Pain? No/denies          [facility] OT Assessment - 07/18/14 0001    Assessment   Diagnosis L hemiparesis   Onset Date --  Early october 2015   Prior Therapy Not for current condition   Precautions   Precautions None   Restrictions   Weight Bearing Restrictions No   Balance Screen   Has the patient fallen in the past 6 months No   Home  Environment   Family/patient expects to be discharged to: Private residence   Living Arrangements Alone   Available Help at Discharge --  son and 2 daughters are her neighbors   Prior Function   Level of Independence Independent with basic ADLs;Independent with homemaking with ambulation   Vocation Retired   [employer] Was school custodian   Leisure visiting her daughter at [place], reading, sewing, helping families at church   ADL   ADL comments pt  is independent with all ADLs.  Requires assist to open water bottles.  Requries increased time to do bottons and fix her hair. Extra time for laundry, cooking, and grocery shopping   Written Expression   Dominant Hand Right   Vision - History   Baseline Vision Wears glasses all the time   Additional Comments no changes   Vision Assessment   Eye Alignment Within Functional Limits   Cognition   Overall Cognitive Status Within Functional Limits for tasks assessed   Observation/Other Assessments   Other Surveys  Select   Quick DASH  63.63   Sensation   Light Touch Appears Intact   Coordination   Gross Motor Movements are Fluid and Coordinated Yes   Fine Motor Movements are Fluid and Coordinated No   9 Hole Peg Test (p) Right 27.1 seconds; Left 33.20 seconds   AROM   Overall AROM  Within functional limits for  tasks performed   Strength   Overall Strength Deficits   Left Shoulder Flexion 4/5   Left Shoulder ABduction --  4-/5   Left Shoulder Internal Rotation --  4-/5   Left Shoulder External Rotation --  4-/5   Left Elbow Flexion --  4+/5   Left Elbow Extension --  4+/5   Left Forearm Pronation --  4+/5   Left Forearm Supination --  4+/5   Left Wrist Flexion --  4+/5   Left Wrist Extension --  4+/5   Left Wrist Radial Deviation 5/5   Left Wrist Ulnar Deviation 5/5   Grip (lbs) R 24   Right Hand Lateral Pinch 5 lbs   Right Hand 3 Point Pinch 7 lbs   Grip (lbs) L 17   Left Hand Lateral Pinch 4 lbs   Left Hand 3 Point Pinch 4 lbs            OT Education - 07/18/14 1556    Education provided Yes   Education Details HEP - fine motor coordination   Person(s) Educated Patient   Methods Explanation;Demonstration;Handout   Comprehension Verbalized understanding          OT Short Term Goals - 07/18/14 1708    OT SHORT TERM GOAL #1   Title Pt will be educated on HEP   Time 4   Period Weeks   Status New   OT SHORT TERM GOAL #2   Title Pt will improve LUE overall strength to atleast 4+/5 for improved ability to hold her hands behind her head when fixing her hair.   Time 4   Period Weeks   Status New   OT SHORT TERM GOAL #3   Title Pt will improve grip strength to atleast 20lbs in her LUE for improved ability to open water bottles   Time 4   Period Weeks   Status New   OT SHORT TERM GOAL #4   Title Pt will improve 3-point pinch to atleast 6lbs for improved ability to manage shirt buttons.            Plan - 07/18/14 1704    Clinical Impression Statement Pt is presenting to outpatient OT s/p CVA with L sided weakness.  Pt had decreased strength in her LUE, with the most deficits in her shoudler and hand.  She has decreased fine motor coordination.  All deficits lead to increased difficulty engaging in important IADL tasks.   Rehab Potential Good   OT Frequency  2x / week   OT Duration 4 weeks   OT  Treatment/Interventions Self-care/ADL training;DME and/or AE instruction;Neuromuscular education;Therapeutic exercise;Therapeutic exercises;Therapeutic activities;Patient/family education   Plan pt will benefit from skilled OT services to increase LUE strength and coordination and improved overall ADL and IADL independence.      Treatment Plan: weighted LUE strengthening tasks, fine motor coordination/strengtheing tasks, proximal shoulder strengthening, pinch/grip strengthening   OT Home Exercise Plan fine motor coordination activities handout   Consulted and Agree with Plan of Care Patient          G-Codes - 08/04/2014 1711    Functional Assessment Tool Used Quick DASH - 63.63% impaired   Functional Limitation Carrying, moving and handling objects   Carrying, Moving and Handling Objects Current Status (U2353) At least 60 percent but less than 80 percent impaired, limited or restricted   Carrying, Moving and Handling Objects Goal Status (I1443) At least 20 percent but less than 40 percent impaired, limited or restricted      Problem List Patient Active Problem List   Diagnosis Date Noted  . CVA (cerebral vascular accident) 07/05/2014  . Seborrheic keratoses, inflamed 03/08/2014  . Decreased hearing 03/08/2014  . OA (osteoarthritis) 03/07/2014  . Rhinosinusitis 11/09/2013  . Generalized OA 04/11/2013  . Pes anserinus bursitis of left knee 12/28/2012  . Abnormality of gait 05/18/2012  . Lower back pain 05/11/2012  . Essential hypertension, benign 04/17/2012  . Hip pain, bilateral 04/17/2012  . Hyperlipidemia 04/17/2012  . H/O: stroke 04/17/2012  . Iron deficiency anemia 04/17/2012    Marry Guan Sadik Piascik, MS, OTR/L [facility] [phone] 08/04/14, 5:15 PM

## 2014-07-19 ENCOUNTER — Ambulatory Visit (HOSPITAL_COMMUNITY)
Admission: RE | Admit: 2014-07-19 | Discharge: 2014-07-19 | Disposition: A | Discharge status: Home / Self Care | Payer: Medicare Other | Source: Ambulatory Visit | Attending: Family Medicine | Admitting: Family Medicine

## 2014-07-19 DIAGNOSIS — R29818 Other symptoms and signs involving the nervous system: Secondary | ICD-10-CM

## 2014-07-19 DIAGNOSIS — Z5189 Encounter for other specified aftercare: Secondary | ICD-10-CM | POA: Diagnosis not present

## 2014-07-19 DIAGNOSIS — R29898 Other symptoms and signs involving the musculoskeletal system: Secondary | ICD-10-CM

## 2014-07-19 DIAGNOSIS — IMO0002 Reserved for concepts with insufficient information to code with codable children: Secondary | ICD-10-CM

## 2014-07-19 DIAGNOSIS — R531 Weakness: Secondary | ICD-10-CM

## 2014-07-19 DIAGNOSIS — R269 Unspecified abnormalities of gait and mobility: Secondary | ICD-10-CM

## 2014-07-19 DIAGNOSIS — R262 Difficulty in walking, not elsewhere classified: Secondary | ICD-10-CM

## 2014-07-19 NOTE — Therapy (Signed)
Physical Therapy Treatment  Patient Details  Name: Dawn Church MRN: [id number] Date of Birth: 02/17/1928  Encounter Date: 07/19/2014      PT End of Session - 07/19/14 1443    Visit Number 2   Number of Visits 16   Date for PT Re-Evaluation 08/17/14   Authorization Type [employer] Medicare   Authorization - Visit Number 2   Authorization - Number of Visits 16   PT Start Time 1356   PT Stop Time 1438   PT Time Calculation (min) 42 min   Activity Tolerance Patient tolerated treatment well   Behavior During Therapy WFL for tasks assessed/performed      Past Medical History  Diagnosis Date  . TIA (transient ischemic attack)   . Epigastric pain   . Stomach ulcer   . Hemochromatosis   . Anemia   . CVA (cerebral infarction)   . Hypercholesteremia   . Hypertension   . Hx of seasonal allergies     Past Surgical History  Procedure Laterality Date  . Vaginal hysterectomy    . Appendectomy    . Esophagogastroduodenoscopy  12/20/2010  . Breast biopsy      Bilateral  . Breast biopsy      bilateral  . Shoulder open rotator cuff repair      right   . Cataract extraction, bilateral      There were no vitals taken for this visit.  Visit Diagnosis:  Abnormality of gait  Difficulty balancing  Difficulty walking  Weakness of left leg  Lack of coordination due to stroke  Weakness generalized      Subjective Assessment - 07/19/14 1359    Symptoms Pt reports some stiffness in (B) LE from evaluation yesterday.    Currently in Pain? No/denies          [facility] PT Assessment - 07/19/14 1400    Assessment   Medical Diagnosis Gait instability secondary to Lt LE weakness and stiffness follwoign stroke.    Onset Date 05/18/14   Next MD Visit Jeanice Lim,           [place] [facility] PT Treatment/Exercise - 07/19/14 1424    Exercises   Exercises Knee/Hip   Knee/Hip Exercises: Stretches   Active Hamstring Stretch 2 reps;30 seconds   Active Hamstring Stretch Limitations 14" Box   Quad Stretch 2 reps;30 seconds   Quad Stretch Limitations Prone with rope   Gastroc Stretch 2 reps;30 seconds   Gastroc Stretch Limitations Slantboard   Knee/Hip Exercises: Standing   Forward Lunges 10 reps;Left   Forward Lunges Limitations onto 4" box   Knee/Hip Exercises: Seated   Long Arc Quad 2 sets;10 reps;Left   Long Arc Quad Weight 1 lbs.   Other Seated Knee Exercises STS 2x5, no UE   Knee/Hip Exercises: Supine   Bridges 10 reps   Straight Leg Raises 10 reps;Left   Knee/Hip Exercises: Sidelying   Hip ABduction 10 reps;Left   Clams x10, Lt LE   Knee/Hip Exercises: Prone   Hamstring Curl 10 reps   Hip Extension 10 reps;Left          PT Education - 07/19/14 1442    Education provided Yes   Education Details HEP provided : gastroc/hamstring/quad stretch, SLR x3, bridging, clam shells, prone HS curls, FAQ   Person(s) Educated Patient   Methods Explanation;Demonstration;Handout   Comprehension Verbalized understanding;Returned demonstration          PT Short Term Goals - 07/19/14 1448    PT SHORT TERM  GOAL #1   Title Patient will demosntrate improved 5x sit to stand to <15 seconds indicating patient not at high falls risk    Status On-going   PT SHORT TERM GOAL #3   Title hip extension strength demonstrate a 3+/5 MMT to be able to more easily go up and down stairs   Status On-going          PT Long Term Goals - July 26, 2014 1449    PT LONG TERM GOAL #1   Title Hip abduction strength to improve to 3+/5 to be able to perform single leg stand without loss of balance.   Status On-going          Plan - 2014/07/26 1448    PT Next Visit Plan Review HEP to ensure understanding.  Progress reps and into standing exercises as tolerated.  Add balance challenges, with tandem and SLS as able.           G-Codes - 07-26-14 0810    Functional Assessment Tool Used Clinical Judgement Lt LE strength 2+/5 MMT, and patient was walking a day, no <1 mile a day   Functional  Limitation Mobility: Walking and moving around   Mobility: Walking and Moving Around Current Status [phone]) At least 60 percent but less than 80 percent impaired, limited or restricted   Mobility: Walking and Moving Around Goal Status [phone]) At least 40 percent but less than 60 percent impaired, limited or restricted      Problem List Patient Active Problem List   Diagnosis Date Noted  . CVA (cerebral vascular accident) 07/05/2014  . Seborrheic keratoses, inflamed 03/08/2014  . Decreased hearing 03/08/2014  . OA (osteoarthritis) 03/07/2014  . Rhinosinusitis 11/09/2013  . Generalized OA 04/11/2013  . Pes anserinus bursitis of left knee 12/28/2012  . Abnormality of gait 05/18/2012  . Lower back pain 05/11/2012  . Essential hypertension, benign 04/17/2012  . Hip pain, bilateral 04/17/2012  . Hyperlipidemia 04/17/2012  . H/O: stroke 04/17/2012  . Iron deficiency anemia 04/17/2012       [name] 07-26-14, 2:49 PM

## 2014-07-19 NOTE — Addendum Note (Signed)
Encounter addended by: Lonna Cobb, PT on: 07/19/2014  5:25 PM<BR>     Documentation filed: Clinical Notes, Inpatient Document Flowsheet

## 2014-07-19 NOTE — Therapy (Signed)
Physical Therapy Treatment  Patient Details  Name: Dawn Church MRN: [id number] Date of Birth: June 23, 1928  Encounter Date: 07/19/2014      PT End of Session - 07/19/14 1443    Visit Number 2   Number of Visits 16   Date for PT Re-Evaluation 08/17/14   Authorization Type [employer] Medicare   Authorization - Visit Number 2   Authorization - Number of Visits 16   PT Start Time 1356   PT Stop Time 1438   PT Time Calculation (min) 42 min   Activity Tolerance Patient tolerated treatment well   Behavior During Therapy WFL for tasks assessed/performed      Past Medical History  Diagnosis Date  . TIA (transient ischemic attack)   . Epigastric pain   . Stomach ulcer   . Hemochromatosis   . Anemia   . CVA (cerebral infarction)   . Hypercholesteremia   . Hypertension   . Hx of seasonal allergies     Past Surgical History  Procedure Laterality Date  . Vaginal hysterectomy    . Appendectomy    . Esophagogastroduodenoscopy  12/20/2010  . Breast biopsy      Bilateral  . Breast biopsy      bilateral  . Shoulder open rotator cuff repair      right   . Cataract extraction, bilateral      There were no vitals taken for this visit.  Visit Diagnosis:  Abnormality of gait  Difficulty balancing  Difficulty walking  Weakness of left leg  Lack of coordination due to stroke  Weakness generalized      Subjective Assessment - 07/19/14 1359    Symptoms Pt reports some stiffness in (B) LE from evaluation yesterday.    Currently in Pain? No/denies          [facility] PT Assessment - 07/19/14 1400    Assessment   Medical Diagnosis Gait instability secondary to Lt LE weakness and stiffness follwoign stroke.    Onset Date 05/18/14   Next MD Visit Jeanice Lim,    Observation/Other Assessments   Focus on Therapeutic Outcomes (FOTO)  FOTO 52% limitation          [facility] Adult PT Treatment/Exercise - 07/19/14 1424    Exercises   Exercises Knee/Hip   Knee/Hip Exercises: Stretches   Active Hamstring Stretch 2 reps;30 seconds   Active Hamstring Stretch Limitations 14" Box   Quad Stretch 2 reps;30 seconds   Quad Stretch Limitations Prone with rope   Gastroc Stretch 2 reps;30 seconds   Gastroc Stretch Limitations Slantboard   Knee/Hip Exercises: Standing   Forward Lunges 10 reps;Left   Forward Lunges Limitations onto 4" box   Knee/Hip Exercises: Seated   Long Arc Quad 2 sets;10 reps;Left   Long Arc Quad Weight 1 lbs.   Other Seated Knee Exercises STS 2x5, no UE   Knee/Hip Exercises: Supine   Bridges 10 reps   Straight Leg Raises 10 reps;Left   Knee/Hip Exercises: Sidelying   Hip ABduction 10 reps;Left   Clams x10, Lt LE   Knee/Hip Exercises: Prone   Hamstring Curl 10 reps   Hip Extension 10 reps;Left          PT Education - 07/19/14 1442    Education provided Yes   Education Details HEP provided : gastroc/hamstring/quad stretch, SLR x3, bridging, clam shells, prone HS curls, FAQ   Person(s) Educated Patient   Methods Explanation;Demonstration;Handout   Comprehension Verbalized understanding;Returned demonstration  PT Short Term Goals - 01-Aug-2014 1448    PT SHORT TERM GOAL #1   Title Patient will demosntrate improved 5x sit to stand to <15 seconds indicating patient not at high falls risk    Status On-going   PT SHORT TERM GOAL #3   Title hip extension strength demonstrate a 3+/5 MMT to be able to more easily go up and down stairs   Status On-going          PT Long Term Goals - 08/01/2014 1449    PT LONG TERM GOAL #1   Title Hip abduction strength to improve to 3+/5 to be able to perform single leg stand without loss of balance.   Status On-going          Plan - 08/01/2014 1448    PT Next Visit Plan Review HEP to ensure understanding.  Progress reps and into standing exercises as tolerated.  Add balance challenges, with tandem and SLS as able.           G-Codes - 08-01-14 0810    Functional Assessment Tool Used Clinical Judgement  Lt LE strength 2+/5 MMT, and patient was walking a day, no <1 mile a day   Functional Limitation Mobility: Walking and moving around   Mobility: Walking and Moving Around Current Status [phone]) At least 60 percent but less than 80 percent impaired, limited or restricted   Mobility: Walking and Moving Around Goal Status [phone]) At least 40 percent but less than 60 percent impaired, limited or restricted      Problem List Patient Active Problem List   Diagnosis Date Noted  . CVA (cerebral vascular accident) 07/05/2014  . Seborrheic keratoses, inflamed 03/08/2014  . Decreased hearing 03/08/2014  . OA (osteoarthritis) 03/07/2014  . Rhinosinusitis 11/09/2013  . Generalized OA 04/11/2013  . Pes anserinus bursitis of left knee 12/28/2012  . Abnormality of gait 05/18/2012  . Lower back pain 05/11/2012  . Essential hypertension, benign 04/17/2012  . Hip pain, bilateral 04/17/2012  . Hyperlipidemia 04/17/2012  . H/O: stroke 04/17/2012  . Iron deficiency anemia 04/17/2012     Dyasia Firestine 08-01-14, 5:25 PM

## 2014-07-19 NOTE — Patient Instructions (Signed)
Hamstring Stretch (Standing)   Standing, place one heel on chair or bench and holding onto countertop. Keeping torso straight, lean forward slowly until a stretch is felt in back of same thigh.  Hold __30__ seconds. Repeat 3 times.     Achilles / Gastroc, Standing   Stand, right foot behind, heel on floor and turned slightly out, leg straight, forward leg bent. Move hips forward. Hold __30_ seconds.  Repeat _3__ times per session. Do _1__ sessions per day.   KNEE: Quadriceps - Prone       Place strap around ankle. Bring ankle toward buttocks. Press hip into surface. Hold _30_ seconds. _3__ reps per set, __1_ sets per day.    HIP / KNEE: Extension - Prone   Squeeze glutes. Raise leg up. Keep knee straight. _10-15__ reps per set, _1-2__ sets per day, _4__ days per week   Hamstring Curls   Lie on stomach.  Bend left knee up toward your butt.  Repeat __10-15__ times. Do _1-2___ sets per day.  Repeat 4 times per week.     Abduction   Lift leg up toward ceiling. Return. Repeat __10-15__ times each leg. Do _1-2___ sets per session.  Repeat 4 times per week.     Clam Shell   Lying on your right side with hips and knees bent 45, use pillow as needed between knees. Lift knee. Be sure pelvis does not roll backward. Do not arch back.  Do _10-15__ times, each leg, _1-2__ times per day.  Do 4 sessions per week.      Straight Leg Raise   Tighten stomach and slowly raise locked left leg.  Repeat _10-15___ times per set. Do __1-2__ sets per session. Do __4__ sessions per week.    Bridging   Slowly raise buttocks from floor, keeping stomach tight.  Repeat __10-15__ times per set. Do __1-2__ sets per session. Do _4___ sessions per week.   Long Arc Quad   Straighten left leg and try to hold it __1-2 __ seconds. Repeat __10-15__ times. Do _1-2___ sessions a day.  Repeat 4 times per week.

## 2014-07-19 NOTE — Therapy (Signed)
Physical Therapy Evaluation  Patient Details  Name: Dawn Church MRN: [id number] Date of Birth: 06-21-1928  Encounter Date: 07/18/2014      PT End of Session - 07/18/14 1602    Visit Number 1   Number of Visits 16   Date for PT Re-Evaluation 08/17/14   Authorization Type [employer] Medicare   Authorization - Visit Number 1   Authorization - Number of Visits 16   PT Start Time 1517   PT Stop Time 1559   PT Time Calculation (min) 42 min   Activity Tolerance Patient tolerated treatment well   Behavior During Therapy [facility] for tasks assessed/performed      Past Medical History  Diagnosis Date  . TIA (transient ischemic attack)   . Epigastric pain   . Stomach ulcer   . Hemochromatosis   . Anemia   . CVA (cerebral infarction)   . Hypercholesteremia   . Hypertension   . Hx of seasonal allergies     Past Surgical History  Procedure Laterality Date  . Vaginal hysterectomy    . Appendectomy    . Esophagogastroduodenoscopy  12/20/2010  . Breast biopsy      Bilateral  . Breast biopsy      bilateral  . Shoulder open rotator cuff repair      right   . Cataract extraction, bilateral      There were no vitals taken for this visit.  Visit Diagnosis:  Abnormality of gait  Difficulty balancing  Difficulty walking  Weakness of left leg      Subjective Assessment - 07/18/14 1525    Symptoms Noted weakness in Lt LE due to Rt sided stroke as well as Rt sided weakness in hip secodnaryto history of osteoarthritis.    Pertinent History Patient had stroke in september 2015, Prior to CVA patient ambulated 5+ miles a day for exercise and was independentl with all tasks. history of high blood pressure.    Patient Stated Goals To return to walkign 5 miles a day by increasing "steadiness", to be able to stand to make dinner,    Currently in Pain? No/denies          [facility] PT Assessment - 07/19/14 0001    Assessment   Medical Diagnosis Gait instability secondary to Lt LE weakness  and stiffness follwoign stroke.    Onset Date 05/18/14   Next MD Visit Jeanice Lim,    Prior Therapy No   Precautions   Precautions None   Restrictions   Weight Bearing Restrictions No   Home Environment   Living Enviornment Private residence   Living Arrangements Alone   Available Help at Discharge Family   Prior Function   Level of Independence Independent with basic ADLs   Vocation Retired   Observation/Other Assessments   Focus on Therapeutic Outcomes (FOTO)  to be completed next session   Sit to Stand   Comments 5x sit to stand: 20.9   Strength   Right Hip Flexion 5/5   Right Hip Extension 4/5   Right Hip ABduction 3+/5   Left Hip Flexion 4/5   Left Hip Extension 2+/5   Left Hip ABduction 2+/5   Right Knee Flexion 4/5   Right Knee Extension 5/5   Left Knee Flexion 3+/5   Left Knee Extension 4/5   Right Ankle Dorsiflexion 5/5   Left Ankle Dorsiflexion 5/5   Berg Balance Test   Sit to Stand Able to stand without using hands and stabilize independently   Standing Unsupported  Able to stand safely 2 minutes   Sitting with Back Unsupported but Feet Supported on Floor or Stool Able to sit safely and securely 2 minutes   Stand to Sit Sits safely with minimal use of hands   Transfers Able to transfer safely, minor use of hands   Standing Unsupported with Eyes Closed Able to stand 10 seconds safely   Standing Ubsupported with Feet Together Able to place feet together independently and stand 1 minute safely   From Standing, Reach Forward with Outstretched Arm Can reach forward >12 cm safely (5")   From Standing Position, Pick up Object from Floor Able to pick up shoe safely and easily   From Standing Position, Turn to Look Behind Over each Shoulder Looks behind from both sides and weight shifts well   Turn 360 Degrees Able to turn 360 degrees safely one side only in 4 seconds or less   Standing Unsupported, Alternately Place Feet on Step/Stool Able to stand independently and complete  8 steps >20 seconds   Standing Unsupported, One Foot in Front Able to plae foot ahead of the other independently and hold 30 seconds   Standing on One Leg Able to lift leg independently and hold 5-10 seconds  51/56 total            PT Education - 07/18/14 1609    Education Details 5x sit to stand          PT Short Term Goals - 07/19/14 0804    PT SHORT TERM GOAL #1   Title Patient will demosntrate improved 5x sit to stand to <15 seconds indicating patient not at high falls risk    Baseline 21 seconds   Time 4   Period Weeks   Status New   PT SHORT TERM GOAL #2   Title Patient will demonstrate BERG balance score of 54/56 indicating improved balance and patient not at high falls risk   Baseline 51/56   Time 4   Period Weeks   Status New   PT SHORT TERM GOAL #3   Title hip extension strength demonstrate a 3+/5 MMT to be able to more easily go up and down stairs   Baseline 2+/5   Time 4   Period Weeks   Status New          PT Long Term Goals - 07/19/14 9147    PT LONG TERM GOAL #1   Title Hip abduction strength to improve to 3+/5 to be able to perform single leg stand without loss of balance.   Baseline 2+/5 MMT   Time 8   Period Weeks   Status New   PT LONG TERM GOAL #2   Title Patient will state independence with home walkign program and able to walk at least 3 miles at a time without resting.    Baseline unable to walk >49minutes   Time 8   Period Weeks   Status New   PT LONG TERM GOAL #3   Title Patient will state she is no longer afraid of falling   Baseline Fear of falling   Time 8   Period Weeks   Status New          Plan - 07/19/14 0804    Clinical Impression Statement Patient displasy Lt LE weakness s/p CVA resultign in unsteady giat decreased walkign tolerance and decreased balance placign patient at high falls risk as indicated from slow 5x sit to stand performance and 51/56 BERG balance score. Patient will  benefit from skilled phsyical  therapy to increase Lt LE strength and balance to return to prior level of indpenedence with all ADLs.    Pt will benefit from skilled therapeutic intervention in order to improve on the following deficits Abnormal gait;Decreased endurance;Improper body mechanics;Decreased activity tolerance;Decreased strength;Difficulty walking;Decreased mobility;Decreased balance   Rehab Potential Good   PT Frequency 2x / week   PT Duration 8 weeks   PT Treatment/Interventions Gait training;Neuromuscular re-education;Stair training;Patient/family education;Functional mobility training;Therapeutic activities;Manual techniques;Electrical Stimulation;Balance training   PT Next Visit Plan Introduce supine, prone, and sidelaying exercises to increase hip and knee strength stability as well as abdominal strengtheing exercises. finish next session with intorduction of lunges to 4 inch box   PT Home Exercise Plan 5x sit to stand          G-Codes - 2014-07-22 0810    Functional Assessment Tool Used Clinical Judgement Lt LE strength 2+/5 MMT, and patient was walking a day, no <1 mile a day   Functional Limitation Mobility: Walking and moving around   Mobility: Walking and Moving Around Current Status [phone]) At least 60 percent but less than 80 percent impaired, limited or restricted   Mobility: Walking and Moving Around Goal Status [phone]) At least 40 percent but less than 60 percent impaired, limited or restricted      Problem List Patient Active Problem List   Diagnosis Date Noted  . CVA (cerebral vascular accident) 07/05/2014  . Seborrheic keratoses, inflamed 03/08/2014  . Decreased hearing 03/08/2014  . OA (osteoarthritis) 03/07/2014  . Rhinosinusitis 11/09/2013  . Generalized OA 04/11/2013  . Pes anserinus bursitis of left knee 12/28/2012  . Abnormality of gait 05/18/2012  . Lower back pain 05/11/2012  . Essential hypertension, benign 04/17/2012  . Hip pain, bilateral 04/17/2012  .  Hyperlipidemia 04/17/2012  . H/O: stroke 04/17/2012  . Iron deficiency anemia 04/17/2012     Saloni Lablanc R PT DPT 07/22/2014, 8:12 AM

## 2014-07-25 ENCOUNTER — Ambulatory Visit (HOSPITAL_COMMUNITY)
Admission: RE | Admit: 2014-07-25 | Discharge: 2014-07-25 | Disposition: A | Discharge status: Home / Self Care | Payer: Medicare Other | Source: Ambulatory Visit | Attending: Family Medicine | Admitting: Family Medicine

## 2014-07-25 ENCOUNTER — Encounter (HOSPITAL_COMMUNITY): Payer: Self-pay

## 2014-07-25 DIAGNOSIS — IMO0002 Reserved for concepts with insufficient information to code with codable children: Secondary | ICD-10-CM

## 2014-07-25 DIAGNOSIS — R269 Unspecified abnormalities of gait and mobility: Secondary | ICD-10-CM

## 2014-07-25 DIAGNOSIS — R29898 Other symptoms and signs involving the musculoskeletal system: Secondary | ICD-10-CM

## 2014-07-25 DIAGNOSIS — Z5189 Encounter for other specified aftercare: Secondary | ICD-10-CM | POA: Diagnosis not present

## 2014-07-25 DIAGNOSIS — R531 Weakness: Secondary | ICD-10-CM

## 2014-07-25 DIAGNOSIS — R29818 Other symptoms and signs involving the nervous system: Secondary | ICD-10-CM

## 2014-07-25 DIAGNOSIS — R262 Difficulty in walking, not elsewhere classified: Secondary | ICD-10-CM

## 2014-07-25 NOTE — Therapy (Signed)
Occupational Therapy Treatment  Patient Details  Name: Dawn Church MRN: [id number] Date of Birth: 07/01/1928  Encounter Date: 07/25/2014      OT End of Session - 07/25/14 1512    Visit Number 2   Number of Visits 8   Date for OT Re-Evaluation 08/15/14   Authorization Type [employer] Medicare Complete   Authorization Time Period Before 10th visit   Authorization - Visit Number 2   Authorization - Number of Visits 10   OT Start Time 1435   OT Stop Time 1515   OT Time Calculation (min) 40 min   Activity Tolerance Patient tolerated treatment well   Behavior During Therapy [facility] for tasks assessed/performed      Past Medical History  Diagnosis Date  . TIA (transient ischemic attack)   . Epigastric pain   . Stomach ulcer   . Hemochromatosis   . Anemia   . CVA (cerebral infarction)   . Hypercholesteremia   . Hypertension   . Hx of seasonal allergies     Past Surgical History  Procedure Laterality Date  . Vaginal hysterectomy    . Appendectomy    . Esophagogastroduodenoscopy  12/20/2010  . Breast biopsy      Bilateral  . Breast biopsy      bilateral  . Shoulder open rotator cuff repair      right   . Cataract extraction, bilateral      There were no vitals taken for this visit.  Visit Diagnosis:  Lack of coordination due to stroke  Weakness generalized      Subjective Assessment - 07/25/14 1441    Symptoms S: Sometimes my right hip hurts like I fell on it but i haven't.   Currently in Pain? No/denies          [facility] OT Assessment - 07/25/14 1455    Precautions   Precautions None          OT Treatments/Exercises (OP) - 07/25/14 1455    Shoulder Exercises: Seated   Protraction Strengthening;Both;12 reps;Weights   Protraction Weight (lbs) 2   Horizontal ABduction Strengthening;Both;10 reps   Horizontal ABduction Weight (lbs) 1   External Rotation Strengthening;Both;12 reps   External Rotation Weight (lbs) 1   Internal Rotation Strengthening;Both;12  reps   Internal Rotation Weight (lbs) 1   Flexion Strengthening;10 reps   Flexion Weight (lbs) 2   Abduction Strengthening;Both;10 reps   ABduction Weight (lbs) 1   Other Seated Exercises Shoulder press, 2#, 12X   Shoulder Exercises: ROM/Strengthening   UBE (Upper Arm Bike) Level 1 2' forward 2' reverse   Proximal Shoulder Strengthening, Supine 10X rest breaks after each position   Hand Exercises   Hand Gripper with Large Beads 6/6 with gripper set at 11#   Hand Gripper with Medium Beads 11/11 with gripper set at 11#   Other Hand Exercises Card tasks to increase [facility] including: card flipping, card dealing with thumb,card rotation using 1st-3rd metacarpals. Pt had increased difficulty with rotating cards.             OT Short Term Goals - 07/25/14 1441    OT SHORT TERM GOAL #1   Title Pt will be educated on HEP   Time 4   Period Weeks   Status On-going   OT SHORT TERM GOAL #2   Title Pt will improve LUE overall strength to atleast 4+/5 for improved ability to hold her hands behind her head when fixing her hair.   Time 4  Period Weeks   Status On-going   OT SHORT TERM GOAL #3   Title Pt will improve grip strength to atleast 20lbs in her LUE for improved ability to open water bottles   Time 4   Period Weeks   Status On-going   OT SHORT TERM GOAL #4   Title Pt will improve 3-point pinch to atleast 6lbs for improved ability to manage shirt buttons.   Status On-going            Plan - 07/25/14 1513    Clinical Impression Statement A: Initiated grip strengthening and BUE strengthening exercises. Pt initially started out with 2# handweight and needed to drop down to 1# due to LUE muscle fatigue. Overall, patient tolerated well with significant muscle fatigue noticed during exercises. Rest breaks were given when needed.    Plan P: Cont with UB strengthening exercises. Add pinch strengthening task.        Problem List Patient Active Problem List   Diagnosis Date Noted   . CVA (cerebral vascular accident) 07/05/2014  . Seborrheic keratoses, inflamed 03/08/2014  . Decreased hearing 03/08/2014  . OA (osteoarthritis) 03/07/2014  . Rhinosinusitis 11/09/2013  . Generalized OA 04/11/2013  . Pes anserinus bursitis of left knee 12/28/2012  . Abnormality of gait 05/18/2012  . Lower back pain 05/11/2012  . Essential hypertension, benign 04/17/2012  . Hip pain, bilateral 04/17/2012  . Hyperlipidemia 04/17/2012  . H/O: stroke 04/17/2012  . Iron deficiency anemia 04/17/2012    Ailene Ravel, OTR/L,CBIS  [phone]  07/25/2014, 3:26 PM

## 2014-07-25 NOTE — Therapy (Signed)
Physical Therapy Treatment  Patient Details  Name: Dawn Church MRN: [id number] Date of Birth: 03-07-1928  Encounter Date: 07/25/2014    Past Medical History  Diagnosis Date  . TIA (transient ischemic attack)   . Epigastric pain   . Stomach ulcer   . Hemochromatosis   . Anemia   . CVA (cerebral infarction)   . Hypercholesteremia   . Hypertension   . Hx of seasonal allergies     Past Surgical History  Procedure Laterality Date  . Vaginal hysterectomy    . Appendectomy    . Esophagogastroduodenoscopy  12/20/2010  . Breast biopsy      Bilateral  . Breast biopsy      bilateral  . Shoulder open rotator cuff repair      right   . Cataract extraction, bilateral      There were no vitals taken for this visit.  Visit Diagnosis:  Abnormality of gait  Difficulty balancing  Difficulty walking  Weakness of left leg  Lack of coordination due to stroke  Weakness generalized  Subjective:  Pt states she is doing well today. States she has been doing her exercise at home.          [facility] Adult PT Treatment/Exercise - 07/25/14 1433    Knee/Hip Exercises: Stretches   Active Hamstring Stretch 2 reps;30 seconds   Active Hamstring Stretch Limitations 12" Box   Gastroc Stretch 2 reps;30 seconds   Gastroc Stretch Limitations Slantboard   Knee/Hip Exercises: Standing   Forward Lunges 10 reps;Left   Forward Lunges Limitations onto 4" box   SLS bilateral no UE's Rt:5", Lt" 4"   Other Standing Knee Exercises tandem stance 2X15" each   Knee/Hip Exercises: Seated   Other Seated Knee Exercises STS 10, no UE   Knee/Hip Exercises: Supine   Bridges 10 reps   Straight Leg Raises 10 reps;Left   Knee/Hip Exercises: Sidelying   Hip ABduction 15 reps   Clams x15, Lt LE   Knee/Hip Exercises: Prone   Hamstring Curl 15 reps;20 reps   Hip Extension 10 reps;Left      Assessment:   continued with stretching and strengthening per POC. Pt requires therapist facilitation especially  with prone exercises for proper alighnment and isolation of muscles being worked. Pt reports overall improvement following session Plan:  Progress standing exercises as tolerated.  Add balance challenges, with tandem and SLS as able.       Problem List Patient Active Problem List   Diagnosis Date Noted  . CVA (cerebral vascular accident) 07/05/2014  . Seborrheic keratoses, inflamed 03/08/2014  . Decreased hearing 03/08/2014  . OA (osteoarthritis) 03/07/2014  . Rhinosinusitis 11/09/2013  . Generalized OA 04/11/2013  . Pes anserinus bursitis of left knee 12/28/2012  . Abnormality of gait 05/18/2012  . Lower back pain 05/11/2012  . Essential hypertension, benign 04/17/2012  . Hip pain, bilateral 04/17/2012  . Hyperlipidemia 04/17/2012  . H/O: stroke 04/17/2012  . Iron deficiency anemia 04/17/2012        Teena Irani, PTA/CLT [phone] 07/25/2014, 3:25 PM

## 2014-07-26 ENCOUNTER — Ambulatory Visit (HOSPITAL_COMMUNITY)
Admission: RE | Admit: 2014-07-26 | Discharge: 2014-07-26 | Disposition: A | Discharge status: Home / Self Care | Payer: Medicare Other | Source: Ambulatory Visit | Attending: Family Medicine | Admitting: Family Medicine

## 2014-07-26 DIAGNOSIS — R262 Difficulty in walking, not elsewhere classified: Secondary | ICD-10-CM

## 2014-07-26 DIAGNOSIS — R29818 Other symptoms and signs involving the nervous system: Secondary | ICD-10-CM

## 2014-07-26 DIAGNOSIS — R531 Weakness: Secondary | ICD-10-CM

## 2014-07-26 DIAGNOSIS — Z5189 Encounter for other specified aftercare: Secondary | ICD-10-CM | POA: Diagnosis not present

## 2014-07-26 DIAGNOSIS — R269 Unspecified abnormalities of gait and mobility: Secondary | ICD-10-CM

## 2014-07-26 DIAGNOSIS — R29898 Other symptoms and signs involving the musculoskeletal system: Secondary | ICD-10-CM

## 2014-07-26 DIAGNOSIS — IMO0002 Reserved for concepts with insufficient information to code with codable children: Secondary | ICD-10-CM

## 2014-07-26 NOTE — Therapy (Signed)
Physical Therapy Treatment  Patient Details  Name: Dawn Church MRN: [id number] Date of Birth: Sep 18, 1927  Encounter Date: 07/26/2014      PT End of Session - 07/26/14 1502    Visit Number 4   Number of Visits 16   Date for PT Re-Evaluation 08/17/14   Authorization Type [employer] Medicare   Authorization - Visit Number 2   Authorization - Number of Visits 16   PT Start Time 1440   PT Stop Time 1515   PT Time Calculation (min) 35 min   Activity Tolerance Patient tolerated treatment well   Behavior During Therapy [facility] for tasks assessed/performed      Past Medical History  Diagnosis Date  . TIA (transient ischemic attack)   . Epigastric pain   . Stomach ulcer   . Hemochromatosis   . Anemia   . CVA (cerebral infarction)   . Hypercholesteremia   . Hypertension   . Hx of seasonal allergies     Past Surgical History  Procedure Laterality Date  . Vaginal hysterectomy    . Appendectomy    . Esophagogastroduodenoscopy  12/20/2010  . Breast biopsy      Bilateral  . Breast biopsy      bilateral  . Shoulder open rotator cuff repair      right   . Cataract extraction, bilateral      There were no vitals taken for this visit.  Visit Diagnosis:  Lack of coordination due to stroke  Weakness generalized  Abnormality of gait  Difficulty balancing  Difficulty walking  Weakness of left leg      Subjective Assessment - 07/26/14 1441    Symptoms No pain today just notes stiffness.             [place] Adult PT Treatment/Exercise - 07/26/14 0001    Knee/Hip Exercises: Stretches   Active Hamstring Stretch 2 reps;30 seconds   Active Hamstring Stretch Limitations 12" Box   Quad Stretch 2 reps;30 seconds   Quad Stretch Limitations Prone with rope   Gastroc Stretch 2 reps;30 seconds   Gastroc Stretch Limitations Slantboard   Knee/Hip Exercises: Standing   Forward Lunges 10 reps;Left   Forward Lunges Limitations onto 4" box   Side Lunges 10 reps   Side Lunges  Limitations onto 4"   SLS bilateral no UE's Rt:5", Lt" 4"   Other Standing Knee Exercises tandem stance 2X15" each   Other Standing Knee Exercises 3D hip excursion 10x   Knee/Hip Exercises: Seated   Other Seated Knee Exercises STS 3x 5    Knee/Hip Exercises: Supine   Bridges 10 reps            PT Short Term Goals - 07/26/14 1512    PT SHORT TERM GOAL #1   Title Patient will demosntrate improved 5x sit to stand to <15 seconds indicating patient not at high falls risk    Status On-going   PT SHORT TERM GOAL #3   Title hip extension strength demonstrate a 3+/5 MMT to be able to more easily go up and down stairs   Status On-going          PT Long Term Goals - 07/26/14 1513    PT LONG TERM GOAL #1   Title Hip abduction strength to improve to 3+/5 to be able to perform single leg stand without loss of balance.   Baseline 2+/5 MMT   Time 8   Period Weeks   Status On-going   PT LONG  TERM GOAL #2   Title Patient will state independence with home walkign program and able to walk at least 3 miles at a time without resting.    Baseline unable to walk >70minutes   Time 8   Period Weeks   Status On-going   PT LONG TERM GOAL #3   Title Patient will state she is no longer afraid of falling   Baseline Fear of falling   Time 8   Period Weeks   Status On-going          Plan - 07/26/14 1505    Clinical Impression Statement Patient continued with stretching and strengthening per POC though she continues to require cuing for correct performance and notes that she does not perform her exercises regularly. Pt requires therapist facilitation especially ewith quadraceps stretch.. Pt reports overall improvement following sessiontaken    PT Next Visit Plan Progress standing exercises as tolerated.  Add balance challenges, with tandem and SLS as able.         Problem List Patient Active Problem List   Diagnosis Date Noted  . CVA (cerebral vascular accident) 07/05/2014  . Seborrheic  keratoses, inflamed 03/08/2014  . Decreased hearing 03/08/2014  . OA (osteoarthritis) 03/07/2014  . Rhinosinusitis 11/09/2013  . Generalized OA 04/11/2013  . Pes anserinus bursitis of left knee 12/28/2012  . Abnormality of gait 05/18/2012  . Lower back pain 05/11/2012  . Essential hypertension, benign 04/17/2012  . Hip pain, bilateral 04/17/2012  . Hyperlipidemia 04/17/2012  . H/O: stroke 04/17/2012  . Iron deficiency anemia 04/17/2012   Devona Konig PT DPT [phone]

## 2014-08-01 ENCOUNTER — Encounter (HOSPITAL_COMMUNITY): Payer: Self-pay

## 2014-08-01 ENCOUNTER — Ambulatory Visit (HOSPITAL_COMMUNITY)
Admission: RE | Admit: 2014-08-01 | Discharge: 2014-08-01 | Disposition: A | Discharge status: Home / Self Care | Payer: Medicare Other | Source: Ambulatory Visit | Attending: Family Medicine | Admitting: Family Medicine

## 2014-08-01 DIAGNOSIS — M25652 Stiffness of left hip, not elsewhere classified: Secondary | ICD-10-CM | POA: Insufficient documentation

## 2014-08-01 DIAGNOSIS — R269 Unspecified abnormalities of gait and mobility: Secondary | ICD-10-CM

## 2014-08-01 DIAGNOSIS — Z5189 Encounter for other specified aftercare: Secondary | ICD-10-CM | POA: Diagnosis not present

## 2014-08-01 DIAGNOSIS — R531 Weakness: Secondary | ICD-10-CM

## 2014-08-01 DIAGNOSIS — I69893 Ataxia following other cerebrovascular disease: Secondary | ICD-10-CM | POA: Insufficient documentation

## 2014-08-01 DIAGNOSIS — M6281 Muscle weakness (generalized): Secondary | ICD-10-CM | POA: Insufficient documentation

## 2014-08-01 DIAGNOSIS — R29898 Other symptoms and signs involving the musculoskeletal system: Secondary | ICD-10-CM

## 2014-08-01 DIAGNOSIS — IMO0002 Reserved for concepts with insufficient information to code with codable children: Secondary | ICD-10-CM

## 2014-08-01 DIAGNOSIS — R262 Difficulty in walking, not elsewhere classified: Secondary | ICD-10-CM | POA: Insufficient documentation

## 2014-08-01 DIAGNOSIS — R29818 Other symptoms and signs involving the nervous system: Secondary | ICD-10-CM

## 2014-08-01 NOTE — Therapy (Signed)
[facility] [address] [place], [place], [address] Phone: [phone]   Fax:  [phone]  Occupational Therapy Treatment  Patient Details  Name: Dawn Church MRN: [id number] Date of Birth: 12-Nov-1927  Encounter Date: 08/01/2014      OT End of Session - 08/01/14 1700    Visit Number 3   Number of Visits 8   Date for OT Re-Evaluation 08/15/14   Authorization Type [employer] Medicare Complete   Authorization Time Period Before 10th visit   Authorization - Visit Number 3   Authorization - Number of Visits 10   OT Start Time 1437   OT Stop Time 1516   OT Time Calculation (min) 39 min   Activity Tolerance Patient tolerated treatment well   Behavior During Therapy [facility] for tasks assessed/performed      Past Medical History  Diagnosis Date  . TIA (transient ischemic attack)   . Epigastric pain   . Stomach ulcer   . Hemochromatosis   . Anemia   . CVA (cerebral infarction)   . Hypercholesteremia   . Hypertension   . Hx of seasonal allergies     Past Surgical History  Procedure Laterality Date  . Vaginal hysterectomy    . Appendectomy    . Esophagogastroduodenoscopy  12/20/2010  . Breast biopsy      Bilateral  . Breast biopsy      bilateral  . Shoulder open rotator cuff repair      right   . Cataract extraction, bilateral      There were no vitals taken for this visit.  Visit Diagnosis:  Lack of coordination due to stroke  Weakness generalized      Subjective Assessment - 08/01/14 1439    Symptoms "I'm exercising, i'm doing more things at home. Screwing off bottle tops is still giving me trouble."   Currently in Pain? No/denies            OT Treatments/Exercises (OP) - 08/01/14 1440    Shoulder Exercises: Seated   Protraction Strengthening;Both;12 reps;Weights   Protraction Weight (lbs) 2   Horizontal ABduction Strengthening;10 reps   Horizontal ABduction Weight (lbs) 2   External Rotation Strengthening;10 reps   External Rotation Weight (lbs) 2   Internal Rotation Strengthening;10 reps   Internal Rotation Weight (lbs) 2   Flexion Strengthening;12 reps   Flexion Weight (lbs) 2   Abduction Strengthening;10 reps   ABduction Weight (lbs) 2   Additional Elbow Exercises   Theraputty Pinch;Locate Pegs  finding 6 small beads in green putty   Theraputty - Roll green putty   Theraputty - Grip 3 point pinch, green putty; gripping using 3/4inch dowel to push into green putty,    Neurological Re-education Exercises   Elbow Flexion Strengthening  12, 2#   Elbow Extension Strengthening  12, 2#   Wrist Flexion Strengthening;10 reps  2#   Wrist Extension Strengthening;10 reps  2#            OT Short Term Goals - 08/01/14 1703    OT SHORT TERM GOAL #1   Title Pt will be educated on HEP   Status On-going   OT SHORT TERM GOAL #2   Title Pt will improve LUE overall strength to atleast 4+/5 for improved ability to hold her hands behind her head when fixing her hair.   Status On-going   OT SHORT TERM GOAL #3   Title Pt will improve grip strength to atleast 20lbs in her LUE for improved  ability to open water bottles   Status On-going   OT SHORT TERM GOAL #4   Title Pt will improve 3-point pinch to atleast 6lbs for improved ability to manage shirt buttons.   Status On-going            Plan - 08/01/14 1702    Clinical Impression Statement Continued UE strengthening this session, and pt had improved tolerance with 2# weights durign exercises.  Added grip and pinch strengthening exercises with theraputty - pt tolerated well. Demonstrated good skills with LUE, but required cueing to use LUE over RUE.     Plan Continue UE strengthening.  Add resisted clothespins for pinch strengthening while reaching.           Problem List Patient Active Problem List   Diagnosis Date Noted  . CVA (cerebral vascular accident) 07/05/2014  . Seborrheic keratoses, inflamed 03/08/2014  . Decreased hearing  03/08/2014  . OA (osteoarthritis) 03/07/2014  . Rhinosinusitis 11/09/2013  . Generalized OA 04/11/2013  . Pes anserinus bursitis of left knee 12/28/2012  . Abnormality of gait 05/18/2012  . Lower back pain 05/11/2012  . Essential hypertension, benign 04/17/2012  . Hip pain, bilateral 04/17/2012  . Hyperlipidemia 04/17/2012  . H/O: stroke 04/17/2012  . Iron deficiency anemia 04/17/2012   Marry Guan Doratha Mcswain, MS, OTR/L [facility] [phone] 08/01/2014, 5:06 PM

## 2014-08-01 NOTE — Therapy (Signed)
Physical Therapy Treatment  Patient Details  Name: Dawn Church MRN: [id number] Date of Birth: 02/24/1928  Encounter Date: 08/01/2014      PT End of Session - 08/01/14 1406    Visit Number 5   Number of Visits 16   Date for PT Re-Evaluation 08/17/14   Authorization Type [employer] Medicare   Authorization - Visit Number 5   Authorization - Number of Visits 16   PT Start Time 9024   PT Stop Time 1430   PT Time Calculation (min) 41 min   Activity Tolerance Patient tolerated treatment well   Behavior During Therapy [facility] for tasks assessed/performed      Past Medical History  Diagnosis Date  . TIA (transient ischemic attack)   . Epigastric pain   . Stomach ulcer   . Hemochromatosis   . Anemia   . CVA (cerebral infarction)   . Hypercholesteremia   . Hypertension   . Hx of seasonal allergies     Past Surgical History  Procedure Laterality Date  . Vaginal hysterectomy    . Appendectomy    . Esophagogastroduodenoscopy  12/20/2010  . Breast biopsy      Bilateral  . Breast biopsy      bilateral  . Shoulder open rotator cuff repair      right   . Cataract extraction, bilateral      There were no vitals taken for this visit.  Visit Diagnosis:  Lack of coordination due to stroke  Weakness generalized  Abnormality of gait  Difficulty balancing  Difficulty walking  Weakness of left leg      Subjective Assessment - 08/01/14 1350    Symptoms No pain today just notes stiffness. States she feels the weather is making her sore/tight today.    Currently in Pain? No/denies            [facility] Adult PT Treatment/Exercise - 08/01/14 0001    Knee/Hip Exercises: Stretches   Active Hamstring Stretch 2 reps;30 seconds   Active Hamstring Stretch Limitations 12" Box   Quad Stretch 2 reps;30 seconds   Quad Stretch Limitations Prone with rope   Gastroc Stretch 2 reps;30 seconds   Gastroc Stretch Limitations Slantboard   Knee/Hip Exercises: Aerobic   Stationary Bike Nustep  35min lvl 2   Knee/Hip Exercises: Standing   Forward Lunges 10 reps;Left   Forward Lunges Limitations onto 2" box   Side Lunges 10 reps   Side Lunges Limitations onto 2"   Lateral Step Up Step Height: 4";Hand Hold: 1;10 reps   Forward Step Up Step Height: 4";Hand Hold: 1;10 reps   Functional Squat Limitations Squat reach matrix with 3lb dumbbells 5x chair given to cue depth (vebl and tactile cues needed throughout performance   SLS bilateral no UE's Rt:5", Lt" 4"   Other Standing Knee Exercises tandem gait 17ft forwards and backwards.    Other Standing Knee Exercises 3D hip excursion 10x            PT Short Term Goals - 08/01/14 1410    PT SHORT TERM GOAL #1   Title Patient will demosntrate improved 5x sit to stand to <15 seconds indicating patient not at high falls risk    Status On-going   PT SHORT TERM GOAL #2   Title Patient will demonstrate BERG balance score of 54/56 indicating improved balance and patient not at high falls risk   Status On-going   PT SHORT TERM GOAL #3   Title hip extension strength demonstrate  a 3+/5 MMT to be able to more easily go up and down stairs   Status On-going          PT Long Term Goals - 08/01/14 1410    PT LONG TERM GOAL #1   Title Hip abduction strength to improve to 3+/5 to be able to perform single leg stand without loss of balance.   Baseline 2+/5 MMT   Time 8   Period Weeks   Status On-going   PT LONG TERM GOAL #2   Title Patient will state independence with home walkign program and able to walk at least 3 miles at a time without resting.    Baseline unable to walk >22minutes   Time 8   Period Weeks   Status On-going   PT LONG TERM GOAL #3   Title Patient will state she is no longer afraid of falling   Baseline Fear of falling   Time 8   Period Weeks   Status On-going          Plan - 08/01/14 1423    Clinical Impression Statement Patient tolerated progressed fucntional exercis4s well thoguh she needed continual  verbal and tactile cuing for squattign and lunging performance to be done correctly.tandem gait also added today with noted difficuty duign tasndem gait and reciproval marching expecially when performedon the airex pad.   PT Next Visit Plan Progress step ups to 6" and continue squat reach matrix.  Add balance challenges, with tandem and SLS as able.         Problem List Patient Active Problem List   Diagnosis Date Noted  . CVA (cerebral vascular accident) 07/05/2014  . Seborrheic keratoses, inflamed 03/08/2014  . Decreased hearing 03/08/2014  . OA (osteoarthritis) 03/07/2014  . Rhinosinusitis 11/09/2013  . Generalized OA 04/11/2013  . Pes anserinus bursitis of left knee 12/28/2012  . Abnormality of gait 05/18/2012  . Lower back pain 05/11/2012  . Essential hypertension, benign 04/17/2012  . Hip pain, bilateral 04/17/2012  . Hyperlipidemia 04/17/2012  . H/O: stroke 04/17/2012  . Iron deficiency anemia 04/17/2012     Devona Konig PT DPT [phone]

## 2014-08-02 ENCOUNTER — Ambulatory Visit (HOSPITAL_COMMUNITY)
Admission: RE | Admit: 2014-08-02 | Discharge: 2014-08-02 | Disposition: A | Discharge status: Home / Self Care | Payer: Medicare Other | Source: Ambulatory Visit | Attending: Family Medicine | Admitting: Family Medicine

## 2014-08-02 ENCOUNTER — Telehealth: Payer: Self-pay | Admitting: Family Medicine

## 2014-08-02 ENCOUNTER — Encounter (HOSPITAL_COMMUNITY): Payer: Self-pay

## 2014-08-02 DIAGNOSIS — R531 Weakness: Secondary | ICD-10-CM

## 2014-08-02 DIAGNOSIS — IMO0002 Reserved for concepts with insufficient information to code with codable children: Secondary | ICD-10-CM

## 2014-08-02 DIAGNOSIS — R29818 Other symptoms and signs involving the nervous system: Secondary | ICD-10-CM

## 2014-08-02 DIAGNOSIS — R262 Difficulty in walking, not elsewhere classified: Secondary | ICD-10-CM

## 2014-08-02 DIAGNOSIS — R29898 Other symptoms and signs involving the musculoskeletal system: Secondary | ICD-10-CM

## 2014-08-02 DIAGNOSIS — R269 Unspecified abnormalities of gait and mobility: Secondary | ICD-10-CM

## 2014-08-02 DIAGNOSIS — Z5189 Encounter for other specified aftercare: Secondary | ICD-10-CM | POA: Diagnosis not present

## 2014-08-02 MED ORDER — RAMIPRIL 10 MG PO CAPS: 10.0000 mg | ORAL_CAPSULE | Freq: Two times a day (BID) | ORAL | Status: DC

## 2014-08-02 NOTE — Therapy (Signed)
[facility] [address] [place], [place], [address] Phone: [phone]   Fax:  [phone]  Physical Therapy Treatment  Patient Details  Name: Dawn Church MRN: [id number] Date of Birth: 1928-01-11  Encounter Date: 08/02/2014      PT End of Session - 08/02/14 1323    Visit Number 6   Number of Visits 16   Date for PT Re-Evaluation 08/17/14   Authorization Type [employer] Medicare   Authorization - Visit Number 6   Authorization - Number of Visits 16   PT Start Time 2706   PT Stop Time 1345   PT Time Calculation (min) 41 min   Activity Tolerance Patient tolerated treatment well   Behavior During Therapy [facility] for tasks assessed/performed      Past Medical History  Diagnosis Date  . TIA (transient ischemic attack)   . Epigastric pain   . Stomach ulcer   . Hemochromatosis   . Anemia   . CVA (cerebral infarction)   . Hypercholesteremia   . Hypertension   . Hx of seasonal allergies     Past Surgical History  Procedure Laterality Date  . Vaginal hysterectomy    . Appendectomy    . Esophagogastroduodenoscopy  12/20/2010  . Breast biopsy      Bilateral  . Breast biopsy      bilateral  . Shoulder open rotator cuff repair      right   . Cataract extraction, bilateral      There were no vitals taken for this visit.  Visit Diagnosis:  Lack of coordination due to stroke  Weakness generalized  Abnormality of gait  Difficulty balancing  Weakness of left leg  Difficulty walking      Subjective Assessment - 08/01/14 1350    Symptoms No pain today just notes stiffness. States she feels the weather is making her sore/tight today.    Currently in Pain? No/denies            [facility] Adult PT Treatment/Exercise - 08/02/14 1306    Knee/Hip Exercises: Stretches   Active Hamstring Stretch 2 reps;30 seconds   Active Hamstring Stretch Limitations 12" Box   Gastroc Stretch 2 reps;30 seconds   Gastroc Stretch Limitations Slantboard   Knee/Hip Exercises: Standing   Forward Lunges 10 reps;Left   Forward Lunges Limitations onto 2" box   Side Lunges 10 reps   Side Lunges Limitations onto 2"   Lateral Step Up Step Height: 4";Hand Hold: 1;10 reps;Both   Forward Step Up Step Height: 4";Hand Hold: 1;10 reps;Both   Functional Squat Limitations Squat reach matrix with 3lb dumbbells 5x chair given to cue depth (vebl and tactile cues needed throughout performance   SLS bilateral no UE's Rt:8", Lt" 14"   Other Standing Knee Exercises tandem gait 53ft forwards and backwards.    Other Standing Knee Exercises reciprocal marching on airex pad   Knee/Hip Exercises: Seated   Other Seated Knee Exercises STS 3x 5             PT Short Term Goals - 08/02/14 1323    PT SHORT TERM GOAL #1   Title Patient will demosntrate improved 5x sit to stand to <15 seconds indicating patient not at high falls risk    Status On-going   PT SHORT TERM GOAL #2   Title Patient will demonstrate BERG balance score of 54/56 indicating improved balance and patient not at high falls risk   Status On-going   PT SHORT TERM GOAL #3  Title hip extension strength demonstrate a 3+/5 MMT to be able to more easily go up and down stairs   Status On-going          PT Long Term Goals - 08/02/14 1323    PT LONG TERM GOAL #1   Title Hip abduction strength to improve to 3+/5 to be able to perform single leg stand without loss of balance.   Baseline 2+/5 MMT   Time 8   Period Weeks   Status On-going   PT LONG TERM GOAL #2   Title Patient will state independence with home walkign program and able to walk at least 3 miles at a time without resting.    Baseline unable to walk >42minutes   Time 8   Period Weeks   Status On-going   PT LONG TERM GOAL #3   Title Patient will state she is no longer afraid of falling   Baseline Fear of falling   Time 8   Period Weeks   Status On-going          Plan - 08/02/14 1346    Clinical Impression Statement Patient  able to complete all exercises; nustep was not available to complete.  Pt with extreme difficulty coordinating movements and increasing squat depth with squat matrix requiring max therapist facilitation.  Improved stability with tandem/retro gait and reciprocal marching on airex.  Pt only required one seated rest break during session today.   PT Next Visit Plan Progress step ups to 6" and continue squat reach matrix.  Add heel and toe walks, sumo walking.         Problem List Patient Active Problem List   Diagnosis Date Noted  . CVA (cerebral vascular accident) 07/05/2014  . Seborrheic keratoses, inflamed 03/08/2014  . Decreased hearing 03/08/2014  . OA (osteoarthritis) 03/07/2014  . Rhinosinusitis 11/09/2013  . Generalized OA 04/11/2013  . Pes anserinus bursitis of left knee 12/28/2012  . Abnormality of gait 05/18/2012  . Lower back pain 05/11/2012  . Essential hypertension, benign 04/17/2012  . Hip pain, bilateral 04/17/2012  . Hyperlipidemia 04/17/2012  . H/O: stroke 04/17/2012  . Iron deficiency anemia 04/17/2012    Teena Irani, PTA/CLT [phone] 08/02/2014, 1:50 PM

## 2014-08-02 NOTE — Therapy (Signed)
[facility] [address] [place], [place], [address] Phone: [phone]   Fax:  [phone]  Occupational Therapy Treatment  Patient Details  Name: Dawn Church MRN: [id number] Date of Birth: Dec 02, 1927  Encounter Date: 08/02/2014      OT End of Session - 08/02/14 1501    Visit Number 4   Number of Visits 8   Date for OT Re-Evaluation 08/15/14   Authorization Type AARP Medicare Complete   Authorization Time Period Before 10th visit   Authorization - Visit Number 4   Authorization - Number of Visits 10   OT Start Time 1400   OT Stop Time 1445   OT Time Calculation (min) 45 min   Activity Tolerance Patient tolerated treatment well   Behavior During Therapy [facility] for tasks assessed/performed      Past Medical History  Diagnosis Date  . TIA (transient ischemic attack)   . Epigastric pain   . Stomach ulcer   . Hemochromatosis   . Anemia   . CVA (cerebral infarction)   . Hypercholesteremia   . Hypertension   . Hx of seasonal allergies     Past Surgical History  Procedure Laterality Date  . Vaginal hysterectomy    . Appendectomy    . Esophagogastroduodenoscopy  12/20/2010  . Breast biopsy      Bilateral  . Breast biopsy      bilateral  . Shoulder open rotator cuff repair      right   . Cataract extraction, bilateral      There were no vitals taken for this visit.  Visit Diagnosis:  Lack of coordination due to stroke  Weakness generalized      Subjective Assessment - 08/02/14 1401    Symptoms S: I Had pain in my hip when i first came in today. Felt like I strained something, but it feels good now.    Currently in Pain? No/denies          [facility] OT Assessment - 08/02/14 1409    Precautions   Precautions None          OT Treatments/Exercises (OP) - 08/02/14 1409    Shoulder Exercises: Seated   Protraction Strengthening;Both;12 reps;Weights   Protraction Weight (lbs) 2   Horizontal ABduction Strengthening;10 reps    Horizontal ABduction Weight (lbs) 1   External Rotation Strengthening;10 reps   External Rotation Weight (lbs) 2   Internal Rotation Strengthening;10 reps   Internal Rotation Weight (lbs) 2   Flexion Strengthening;12 reps   Flexion Weight (lbs) 2   Abduction Strengthening;10 reps   ABduction Weight (lbs) 2   Other Seated Exercises Shoulder press, 2#, 12X   Shoulder Exercises: ROM/Strengthening   UBE (Upper Arm Bike) Level 1 5' forward   Other ROM/Strengthening Exercises Seated Core strengthening exercises completed with yellow weighted ball, 12X, shoulder press, abdominal twist, protraction   Neurological Re-education Exercises   Hand Gripper with Large Beads 6/6 with gripper set at 11#   Hand Gripper with Medium Beads 11/11 with gripper set at 11#   Hand Gripper with Small Beads 3/10 with gripper set at 11#             OT Short Term Goals - 08/02/14 1503    OT SHORT TERM GOAL #1   Title Pt will be educated on HEP   Status On-going   OT SHORT TERM GOAL #2   Title Pt will improve LUE overall strength to atleast 4+/5 for improved ability to hold  her hands behind her head when fixing her hair.   Status On-going   OT SHORT TERM GOAL #3   Title Pt will improve grip strength to atleast 20lbs in her LUE for improved ability to open water bottles   Status On-going   OT SHORT TERM GOAL #4   Title Pt will improve 3-point pinch to atleast 6lbs for improved ability to manage shirt buttons.   Status On-going            Plan - 08/02/14 1502    Clinical Impression Statement A; Continued with UE strengthening this session. Patient required the use of 2# and 1# weights depending on difficulty. Weights were dropped when poor form was demonstrated. Seated core strengthening exercises added to increase core strength.    Plan P: Update HEP for hand strength. Patient may require more of a challenge.         Problem List Patient Active Problem List   Diagnosis Date Noted  . CVA  (cerebral vascular accident) 07/05/2014  . Seborrheic keratoses, inflamed 03/08/2014  . Decreased hearing 03/08/2014  . OA (osteoarthritis) 03/07/2014  . Rhinosinusitis 11/09/2013  . Generalized OA 04/11/2013  . Pes anserinus bursitis of left knee 12/28/2012  . Abnormality of gait 05/18/2012  . Lower back pain 05/11/2012  . Essential hypertension, benign 04/17/2012  . Hip pain, bilateral 04/17/2012  . Hyperlipidemia 04/17/2012  . H/O: stroke 04/17/2012  . Iron deficiency anemia 04/17/2012    Ailene Ravel, OTR/L,CBIS  [phone]  08/02/2014, 3:06 PM

## 2014-08-02 NOTE — Telephone Encounter (Signed)
Patient is calling because she went to [facility] to get her ramipril refilled and they would not fill it Please call her back at [phone]

## 2014-08-02 NOTE — Telephone Encounter (Signed)
Prescription sent to pharmacy.   Call placed to make aware.   No answer and no VM noted.

## 2014-08-03 NOTE — Telephone Encounter (Signed)
Call placed to patient and patient made aware.  

## 2014-08-08 ENCOUNTER — Ambulatory Visit (HOSPITAL_COMMUNITY)
Admission: RE | Admit: 2014-08-08 | Discharge: 2014-08-08 | Disposition: A | Discharge status: Home / Self Care | Payer: Medicare Other | Source: Ambulatory Visit | Attending: Family Medicine | Admitting: Family Medicine

## 2014-08-08 ENCOUNTER — Encounter (HOSPITAL_COMMUNITY): Payer: Self-pay

## 2014-08-08 DIAGNOSIS — IMO0002 Reserved for concepts with insufficient information to code with codable children: Secondary | ICD-10-CM

## 2014-08-08 DIAGNOSIS — Z5189 Encounter for other specified aftercare: Secondary | ICD-10-CM | POA: Diagnosis not present

## 2014-08-08 DIAGNOSIS — R531 Weakness: Secondary | ICD-10-CM

## 2014-08-08 DIAGNOSIS — R269 Unspecified abnormalities of gait and mobility: Secondary | ICD-10-CM

## 2014-08-08 DIAGNOSIS — R29818 Other symptoms and signs involving the nervous system: Secondary | ICD-10-CM

## 2014-08-08 DIAGNOSIS — R29898 Other symptoms and signs involving the musculoskeletal system: Secondary | ICD-10-CM

## 2014-08-08 DIAGNOSIS — R262 Difficulty in walking, not elsewhere classified: Secondary | ICD-10-CM

## 2014-08-08 NOTE — Therapy (Signed)
[facility] [address] [place], [place], [address] Phone: [phone]   Fax:  [phone]  Occupational Therapy Treatment  Patient Details  Name: Dawn Church MRN: [id number] Date of Birth: 1928/08/25  Encounter Date: 08/08/2014      OT End of Session - 08/08/14 1733    Visit Number 5   Number of Visits 8   Date for OT Re-Evaluation 08/15/14   Authorization Type AARP Medicare Complete   Authorization Time Period Before 10th visit   Authorization - Visit Number 5   Authorization - Number of Visits 10   OT Start Time 1517   OT Stop Time 1558   OT Time Calculation (min) 41 min      Past Medical History  Diagnosis Date  . TIA (transient ischemic attack)   . Epigastric pain   . Stomach ulcer   . Hemochromatosis   . Anemia   . CVA (cerebral infarction)   . Hypercholesteremia   . Hypertension   . Hx of seasonal allergies     Past Surgical History  Procedure Laterality Date  . Vaginal hysterectomy    . Appendectomy    . Esophagogastroduodenoscopy  12/20/2010  . Breast biopsy      Bilateral  . Breast biopsy      bilateral  . Shoulder open rotator cuff repair      right   . Cataract extraction, bilateral      There were no vitals taken for this visit.  Visit Diagnosis:  Lack of coordination due to stroke  Weakness generalized      Subjective Assessment - 08/08/14 1518    Symptoms "i work with my hands at home - i've got several things that i squeeze and that sort of thing."   Currently in Pain? Yes   Pain Score 4    Pain Location Back   Pain Orientation Right;Lower;Lateral            OT Treatments/Exercises (OP) - 08/08/14 1519    Shoulder Exercises: Seated   Protraction Strengthening;Both;12 reps;Weights   Protraction Weight (lbs) 2   Horizontal ABduction Strengthening;10 reps   Horizontal ABduction Weight (lbs) 2   External Rotation Strengthening;12 reps   External Rotation Weight (lbs) 2   Internal  Rotation Strengthening;12 reps   Internal Rotation Weight (lbs) 2   Flexion Strengthening;12 reps   Flexion Weight (lbs) 2   Abduction Strengthening;12 reps   ABduction Weight (lbs) 2   Shoulder Exercises: ROM/Strengthening   Other ROM/Strengthening Exercises Seated Core strengthening exercises completed with yellow weighted ball, 12X, shoulder press, abdominal twist, protraction   Additional Elbow Exercises   Sponges 23, 26   Theraputty Roll;Grip;Pinch;Locate Pegs   Theraputty - Roll green putty - roll, pull,    Theraputty - Grip green putty          OT Education - 08/08/14 1734    Education provided Yes   Education Details HEP updated - green theraputty   Person(s) Educated Patient   Methods Demonstration;Explanation;Handout   Comprehension Verbalized understanding;Returned demonstration          OT Short Term Goals - 08/08/14 1735    OT SHORT TERM GOAL #1   Title Pt will be educated on HEP   Status On-going   OT SHORT TERM GOAL #2   Title Pt will improve LUE overall strength to atleast 4+/5 for improved ability to hold her hands behind her head when fixing her hair.   Status On-going  OT SHORT TERM GOAL #3   Title Pt will improve grip strength to atleast 20lbs in her LUE for improved ability to open water bottles   Status On-going   OT SHORT TERM GOAL #4   Title Pt will improve 3-point pinch to atleast 6lbs for improved ability to manage shirt buttons.   Status On-going            Plan - 08/08/14 1734    Clinical Impression Statement pt indicated incerased shoulder fatigue wtih pulling putty exercise.  Had good tolerance of seated strengthening exercises, but some fatigue.  provided updated HEP with green theraputty - pt verbalized understanding of exercises.     Plan Follow up on new theraputty HEP.  attempt fine motor coordination game.        Problem List Patient Active Problem List   Diagnosis Date Noted  . CVA (cerebral vascular accident)  07/05/2014  . Seborrheic keratoses, inflamed 03/08/2014  . Decreased hearing 03/08/2014  . OA (osteoarthritis) 03/07/2014  . Rhinosinusitis 11/09/2013  . Generalized OA 04/11/2013  . Pes anserinus bursitis of left knee 12/28/2012  . Abnormality of gait 05/18/2012  . Lower back pain 05/11/2012  . Essential hypertension, benign 04/17/2012  . Hip pain, bilateral 04/17/2012  . Hyperlipidemia 04/17/2012  . H/O: stroke 04/17/2012  . Iron deficiency anemia 04/17/2012    Marry Guan Char Feltman, MS, OTR/L [facility] [phone] 08/08/2014, 5:38 PM

## 2014-08-08 NOTE — Therapy (Signed)
[facility] [address] [place], [place], [address] Phone: [phone]   Fax:  [phone]  Physical Therapy Treatment  Patient Details  Name: Dawn Church MRN: [id number] Date of Birth: Dec 25, 1927  Encounter Date: 08/08/2014      PT End of Session - 08/08/14 1513    Visit Number 7   Number of Visits 16   Date for PT Re-Evaluation 08/17/14   Authorization Type UHC Medicare   Authorization - Visit Number 7   Authorization - Number of Visits 16   PT Start Time 8366   PT Stop Time 1513   PT Time Calculation (min) 41 min   Activity Tolerance Patient tolerated treatment well   Behavior During Therapy [facility] for tasks assessed/performed      Past Medical History  Diagnosis Date  . TIA (transient ischemic attack)   . Epigastric pain   . Stomach ulcer   . Hemochromatosis   . Anemia   . CVA (cerebral infarction)   . Hypercholesteremia   . Hypertension   . Hx of seasonal allergies     Past Surgical History  Procedure Laterality Date  . Vaginal hysterectomy    . Appendectomy    . Esophagogastroduodenoscopy  12/20/2010  . Breast biopsy      Bilateral  . Breast biopsy      bilateral  . Shoulder open rotator cuff repair      right   . Cataract extraction, bilateral      There were no vitals taken for this visit.  Visit Diagnosis:  Lack of coordination due to stroke  Weakness generalized  Abnormality of gait  Difficulty balancing  Weakness of left leg  Difficulty walking      Subjective Assessment - 08/08/14 1446    Symptoms Pt states she is really hurting in her Rt lumbar region.  States it started after performing an exercise 2 visits ago.  Currently 7/10 in her Rt side/lumbar region.   Currently in Pain? Yes   Pain Score 7    Pain Location Back   Pain Orientation Right;Lower;Lateral            [facility] Adult PT Treatment/Exercise - 08/08/14 1448    Knee/Hip Exercises: Standing   Other Standing Knee Exercises sumo  walk, tandem gait 60ft forwards and backwards.    Other Standing Knee Exercises reciprocal marching on airex pad   Knee/Hip Exercises: Seated   Other Seated Knee Exercises STS 10x    Shoulder Exercises: Supine   Other Supine Exercises trunk rotations 10 reps, KTC 3X20"   Manual Therapy   Manual Therapy Other (comment)   Other Manual Therapy Soft tissue and myofasical techniques for Rt lumbar region in Lt sidelying            PT Short Term Goals - 08/08/14 1512    PT SHORT TERM GOAL #1   Title Patient will demosntrate improved 5x sit to stand to <15 seconds indicating patient not at high falls risk    Status On-going   PT SHORT TERM GOAL #2   Title Patient will demonstrate BERG balance score of 54/56 indicating improved balance and patient not at high falls risk   Status On-going   PT SHORT TERM GOAL #3   Title hip extension strength demonstrate a 3+/5 MMT to be able to more easily go up and down stairs   Status On-going          PT Long Term Goals - 08/08/14 1512  PT LONG TERM GOAL #1   Title Hip abduction strength to improve to 3+/5 to be able to perform single leg stand without loss of balance.   Baseline 2+/5 MMT   Time 8   Period Weeks   Status On-going   PT LONG TERM GOAL #2   Title Patient will state independence with home walkign program and able to walk at least 3 miles at a time without resting.    Baseline unable to walk >33minutes   Time 8   Period Weeks   Status On-going   PT LONG TERM GOAL #3   Title Patient will state she is no longer afraid of falling   Baseline Fear of falling   Time 8   Period Weeks   Status On-going          Plan - 08/08/14 1513    Clinical Impression Statement focused session today on relieving lumbar pain and continuation of increasing stability. completed manual to Rt lumbar region wtih noted tightness and spasms palpated.  Able to resolve spasms 75% with reduction of pain to 3/10.  Followed up with lower trunk  stretching.  Added  sumo walking and march on airex without UE support without LOB.     PT Next Visit Plan Progress step ups to 6" and continue squat reach matrix.  Add heel and toe walks and resume additional stabilty exercises.  Continue manual as needed for pain and to resolve spasms.           Problem List Patient Active Problem List   Diagnosis Date Noted  . CVA (cerebral vascular accident) 07/05/2014  . Seborrheic keratoses, inflamed 03/08/2014  . Decreased hearing 03/08/2014  . OA (osteoarthritis) 03/07/2014  . Rhinosinusitis 11/09/2013  . Generalized OA 04/11/2013  . Pes anserinus bursitis of left knee 12/28/2012  . Abnormality of gait 05/18/2012  . Lower back pain 05/11/2012  . Essential hypertension, benign 04/17/2012  . Hip pain, bilateral 04/17/2012  . Hyperlipidemia 04/17/2012  . H/O: stroke 04/17/2012  . Iron deficiency anemia 04/17/2012    Teena Irani, PTA/CLT [phone] 08/08/2014, 3:16 PM

## 2014-08-08 NOTE — Patient Instructions (Signed)
Home Exercises Program Theraputty Exercises  Do the following exercises 2-3 times a day using your affected hand.  1. Roll putty into a ball.  2. Make into a pancake.  3. Roll putty into a roll.  4. Pinch along log with first finger and thumb.   5. Make into a ball.  6. Roll it back into a log.   7. Pinch using thumb and side of first finger.  8. Roll into a ball, then flatten into a pancake.  9. Using your fingers, make putty into a mountain.    Bea Graff Talbert Trembath, MS, OTR/L [place]

## 2014-08-09 ENCOUNTER — Encounter (HOSPITAL_COMMUNITY): Payer: Self-pay

## 2014-08-09 ENCOUNTER — Ambulatory Visit (HOSPITAL_COMMUNITY)
Admission: RE | Admit: 2014-08-09 | Discharge: 2014-08-09 | Disposition: A | Discharge status: Home / Self Care | Payer: Medicare Other | Source: Ambulatory Visit | Attending: Family Medicine | Admitting: Family Medicine

## 2014-08-09 DIAGNOSIS — R29818 Other symptoms and signs involving the nervous system: Secondary | ICD-10-CM

## 2014-08-09 DIAGNOSIS — IMO0002 Reserved for concepts with insufficient information to code with codable children: Secondary | ICD-10-CM

## 2014-08-09 DIAGNOSIS — R29898 Other symptoms and signs involving the musculoskeletal system: Secondary | ICD-10-CM

## 2014-08-09 DIAGNOSIS — Z5189 Encounter for other specified aftercare: Secondary | ICD-10-CM | POA: Diagnosis not present

## 2014-08-09 DIAGNOSIS — R531 Weakness: Secondary | ICD-10-CM

## 2014-08-09 DIAGNOSIS — R269 Unspecified abnormalities of gait and mobility: Secondary | ICD-10-CM

## 2014-08-09 DIAGNOSIS — R262 Difficulty in walking, not elsewhere classified: Secondary | ICD-10-CM

## 2014-08-09 NOTE — Therapy (Signed)
[facility] [address] [place], [place], [address] Phone: [phone]   Fax:  [phone]  Physical Therapy Treatment  Patient Details  Name: Dawn Church MRN: [id number] Date of Birth: 30-Jul-1928  Encounter Date: 08/09/2014      PT End of Session - 08/09/14 1443    Visit Number 8   Number of Visits 16   Date for PT Re-Evaluation 08/17/14   Authorization Type UHC Medicare   Authorization - Visit Number 8   Authorization - Number of Visits 16   PT Start Time 1287   PT Stop Time 1430   PT Time Calculation (min) 42 min   Activity Tolerance Patient tolerated treatment well   Behavior During Therapy [facility] for tasks assessed/performed      Past Medical History  Diagnosis Date  . TIA (transient ischemic attack)   . Epigastric pain   . Stomach ulcer   . Hemochromatosis   . Anemia   . CVA (cerebral infarction)   . Hypercholesteremia   . Hypertension   . Hx of seasonal allergies     Past Surgical History  Procedure Laterality Date  . Vaginal hysterectomy    . Appendectomy    . Esophagogastroduodenoscopy  12/20/2010  . Breast biopsy      Bilateral  . Breast biopsy      bilateral  . Shoulder open rotator cuff repair      right   . Cataract extraction, bilateral      There were no vitals taken for this visit.  Visit Diagnosis:  Lack of coordination due to stroke  Weakness generalized  Abnormality of gait  Difficulty balancing  Weakness of left leg  Difficulty walking      Subjective Assessment - 08/09/14 1415    Symptoms Pt stated Rt lumbar pain scale 5/10 today, increase with movement   Currently in Pain? Yes   Pain Score 5    Pain Location Back            [facility] Adult PT Treatment/Exercise - 08/09/14 1419    Exercises   Exercises Knee/Hip;Lumbar   Lumbar Exercises: Stretches   Lower Trunk Rotation Limitations   Lower Trunk Rotation Limitations 10x 5"   Knee/Hip Exercises: Standing   Heel Raises Limitations  heel and toe walking 2RT   Lateral Step Up Both;1 set;10 reps;Hand Hold: 1;Step Height: 6"   Forward Step Up Both;15 reps;Hand Hold: 1;Step Height: 6"   Other Standing Knee Exercises sumo walk, tandem gait 27ft forwards and backwards.    Manual Therapy   Manual Therapy Massage   Other Manual Therapy MFR to soft tissue massage for Rt lumbar regionin Lt sidelying with pillow between nkees            PT Short Term Goals - 08/09/14 1750    PT SHORT TERM GOAL #1   Title Patient will demosntrate improved 5x sit to stand to <15 seconds indicating patient not at high falls risk    Status On-going   PT SHORT TERM GOAL #2   Title Patient will demonstrate BERG balance score of 54/56 indicating improved balance and patient not at high falls risk   Status On-going   PT SHORT TERM GOAL #3   Title hip extension strength demonstrate a 3+/5 MMT to be able to more easily go up and down stairs   Status On-going          PT Long Term Goals - 08/09/14 1750    PT LONG  TERM GOAL #1   Title Hip abduction strength to improve to 3+/5 to be able to perform single leg stand without loss of balance.   Status On-going   PT LONG TERM GOAL #2   Title Patient will state independence with home walkign program and able to walk at least 3 miles at a time without resting.    PT LONG TERM GOAL #3   Title Patient will state she is no longer afraid of falling          Plan - 08/09/14 1444    Clinical Impression Statement Added heel and toe walking for gastroc and anterior tibalis strenghtening and progressing of balance.  Pt able to complete with self correct LOB episodes with min guard required.  Progressed stair traniing to 6in height for funcitonal strengthening and ocntinued squat reach matrix for gluteal strengthening..  Manual techniques complete  for low back pain relief, pt stated pain resolved at end of session.     PT Next Visit Plan Continue with current PT POC.  Continue stair training 6in, begin  step down and continue squat reach matrix.  Progress to dynamic surface for balance training.  Continue manual as needed for pain and to resolve spasms.      Problem List Patient Active Problem List   Diagnosis Date Noted  . CVA (cerebral vascular accident) 07/05/2014  . Seborrheic keratoses, inflamed 03/08/2014  . Decreased hearing 03/08/2014  . OA (osteoarthritis) 03/07/2014  . Rhinosinusitis 11/09/2013  . Generalized OA 04/11/2013  . Pes anserinus bursitis of left knee 12/28/2012  . Abnormality of gait 05/18/2012  . Lower back pain 05/11/2012  . Essential hypertension, benign 04/17/2012  . Hip pain, bilateral 04/17/2012  . Hyperlipidemia 04/17/2012  . H/O: stroke 04/17/2012  . Iron deficiency anemia 04/17/2012   Ihor Austin, Bassett Aldona Lento 08/09/2014, 5:52 PM

## 2014-08-09 NOTE — Therapy (Signed)
[facility] [address] [place], [place], [address] Phone: [phone]   Fax:  [phone]  Occupational Therapy Treatment  Patient Details  Name: Dawn Church MRN: [id number] Date of Birth: Jan 04, 1928  Encounter Date: 08/09/2014      OT End of Session - 08/09/14 1620    Visit Number 6   Number of Visits 8   Date for OT Re-Evaluation 08/15/14   Authorization Type AARP Medicare Complete   Authorization Time Period Before 10th visit   Authorization - Visit Number 6   Authorization - Number of Visits 10   OT Start Time 1436   OT Stop Time 1519   OT Time Calculation (min) 43 min   Activity Tolerance Patient tolerated treatment well   Behavior During Therapy [facility] for tasks assessed/performed      Past Medical History  Diagnosis Date  . TIA (transient ischemic attack)   . Epigastric pain   . Stomach ulcer   . Hemochromatosis   . Anemia   . CVA (cerebral infarction)   . Hypercholesteremia   . Hypertension   . Hx of seasonal allergies     Past Surgical History  Procedure Laterality Date  . Vaginal hysterectomy    . Appendectomy    . Esophagogastroduodenoscopy  12/20/2010  . Breast biopsy      Bilateral  . Breast biopsy      bilateral  . Shoulder open rotator cuff repair      right   . Cataract extraction, bilateral      There were no vitals taken for this visit.  Visit Diagnosis:  Lack of coordination due to stroke  Weakness generalized      Subjective Assessment - 08/09/14 1437    Symptoms "i tried it at home, just making whatever i could come up with (green theraputty)."   Currently in Pain? No/denies            OT Treatments/Exercises (OP) - 08/09/14 1617    Fine Motor Coordination   Fine Motor Coordination In hand manipuation training   In Hand Manipulation Training Engaged pt in tabletop game involving cards and small tokens, requesting pt using ehr L ahnd for all card grasping, placing, and turning, and all  taken placement. Pt reqruied consistent cueing to use LUE, but engaed well in game with use of L.  Demonstrated some diffiuclty with manaing small tokens (attempted to pull takens to edge of table to pick up, rather than grasping off table).  With encouragement was able to pinch coins off table.  Did not verbalize or demonstrate fatigue with consistent sue of LUE during game.          OT Education - 08/08/14 1734    Education provided Yes   Education Details HEP updated - green theraputty   Person(s) Educated Patient   Methods Demonstration;Explanation;Handout   Comprehension Verbalized understanding;Returned demonstration          OT Short Term Goals - 08/09/14 1621    OT SHORT TERM GOAL #1   Title Pt will be educated on HEP   Status On-going   OT SHORT TERM GOAL #2   Title Pt will improve LUE overall strength to atleast 4+/5 for improved ability to hold her hands behind her head when fixing her hair.   Status On-going   OT SHORT TERM GOAL #3   Title Pt will improve grip strength to atleast 20lbs in her LUE for improved ability to open water bottles  Status On-going   OT SHORT TERM GOAL #4   Title Pt will improve 3-point pinch to atleast 6lbs for improved ability to manage shirt buttons.   Status On-going            Plan - 08/09/14 1620    Clinical Impression Statement pt engaed well in tabletop game focused on fine motor coordination and strengthening.  Pt requried some cueing to continue engagment with L hand, but tolerate well when she did use it.  Pt reports using green theraputty at home for HEP.   Plan focus session on shoulder strengthening and proximal shoulder strengthening       Problem List Patient Active Problem List   Diagnosis Date Noted  . CVA (cerebral vascular accident) 07/05/2014  . Seborrheic keratoses, inflamed 03/08/2014  . Decreased hearing 03/08/2014  . OA (osteoarthritis) 03/07/2014  . Rhinosinusitis 11/09/2013  . Generalized OA 04/11/2013   . Pes anserinus bursitis of left knee 12/28/2012  . Abnormality of gait 05/18/2012  . Lower back pain 05/11/2012  . Essential hypertension, benign 04/17/2012  . Hip pain, bilateral 04/17/2012  . Hyperlipidemia 04/17/2012  . H/O: stroke 04/17/2012  . Iron deficiency anemia 04/17/2012   Marry Guan Demitrios Molyneux, MS, OTR/L [facility] [phone] 08/09/2014, 4:22 PM

## 2014-08-15 ENCOUNTER — Encounter (HOSPITAL_COMMUNITY): Payer: Self-pay

## 2014-08-15 ENCOUNTER — Ambulatory Visit (HOSPITAL_COMMUNITY)
Admission: RE | Admit: 2014-08-15 | Discharge: 2014-08-15 | Disposition: A | Discharge status: Home / Self Care | Payer: Medicare Other | Source: Ambulatory Visit | Attending: Family Medicine | Admitting: Family Medicine

## 2014-08-15 DIAGNOSIS — R531 Weakness: Secondary | ICD-10-CM

## 2014-08-15 DIAGNOSIS — R262 Difficulty in walking, not elsewhere classified: Secondary | ICD-10-CM

## 2014-08-15 DIAGNOSIS — R269 Unspecified abnormalities of gait and mobility: Secondary | ICD-10-CM

## 2014-08-15 DIAGNOSIS — R29818 Other symptoms and signs involving the nervous system: Secondary | ICD-10-CM

## 2014-08-15 DIAGNOSIS — IMO0002 Reserved for concepts with insufficient information to code with codable children: Secondary | ICD-10-CM

## 2014-08-15 DIAGNOSIS — Z5189 Encounter for other specified aftercare: Secondary | ICD-10-CM | POA: Diagnosis not present

## 2014-08-15 DIAGNOSIS — R29898 Other symptoms and signs involving the musculoskeletal system: Secondary | ICD-10-CM

## 2014-08-15 NOTE — Therapy (Signed)
[facility] [address] [place], [place], [address] Phone: [phone]   Fax:  [phone]  Occupational Therapy Treatment  Patient Details  Name: Dawn Church MRN: [id number] Date of Birth: 12/02/1927  Encounter Date: 08/15/2014      OT End of Session - 08/15/14 1512    Visit Number 8   Number of Visits 8   Date for OT Re-Evaluation 08/15/14   Authorization Type AARP Medicare Complete   Authorization Time Period Before 10th visit   Authorization - Visit Number 7   Authorization - Number of Visits 10   OT Start Time 1435   OT Stop Time 1513   OT Time Calculation (min) 38 min   Activity Tolerance Patient tolerated treatment well   Behavior During Therapy [facility] for tasks assessed/performed      Past Medical History  Diagnosis Date  . TIA (transient ischemic attack)   . Epigastric pain   . Stomach ulcer   . Hemochromatosis   . Anemia   . CVA (cerebral infarction)   . Hypercholesteremia   . Hypertension   . Hx of seasonal allergies     Past Surgical History  Procedure Laterality Date  . Vaginal hysterectomy    . Appendectomy    . Esophagogastroduodenoscopy  12/20/2010  . Breast biopsy      Bilateral  . Breast biopsy      bilateral  . Shoulder open rotator cuff repair      right   . Cataract extraction, bilateral      There were no vitals taken for this visit.  Visit Diagnosis:  Lack of coordination due to stroke  Weakness generalized      Subjective Assessment - 08/15/14 1436    Symptoms pt reports using green putty for HEP   Currently in Pain? No/denies            OT Treatments/Exercises (OP) - 08/15/14 1436    Shoulder Exercises: Seated   Protraction Strengthening;Both;12 reps;Weights   Protraction Weight (lbs) 2   Horizontal ABduction Strengthening;10 reps  1 rest break   Horizontal ABduction Weight (lbs) 2   External Rotation Strengthening;12 reps   External Rotation Weight (lbs) 2   Internal Rotation  Strengthening;12 reps   Internal Rotation Weight (lbs) 2   Flexion Strengthening;12 reps   Flexion Weight (lbs) 2   Abduction Strengthening;12 reps  1 rest break   ABduction Weight (lbs) 2   Other Seated Exercises Shoulder press, 2#, 10x with 2#   Shoulder Exercises: ROM/Strengthening   UBE (Upper Arm Bike) level 1, 2 min forward and 2 min back   Thumb Tacks 1 min   X to V Arms 10x AROM   Proximal Shoulder Strengthening, Seated 10x each with no weight   Ball on Wall 1 min on tabletop and 1 min on wall, with weighted green ball   Prot/Ret//Elev/Dep 1 min   Other ROM/Strengthening Exercises Seated Core strengthening exercises completed with yellow weighted ball, 12X, shoulder press, abdominal twist, protraction   Fine Motor Coordination   Fine Motor Coordination Digit   In Hand Manipulation Training Resisted clothespin exercises: green, blue, and black clothespins reaching into shoulder flexion to place and remove from tall tree using 3-point pinch and LUE only            OT Short Term Goals - 08/15/14 1513    OT SHORT TERM GOAL #1   Title Pt will be educated on HEP   Status On-going  OT SHORT TERM GOAL #2   Title Pt will improve LUE overall strength to atleast 4+/5 for improved ability to hold her hands behind her head when fixing her hair.   Status On-going   OT SHORT TERM GOAL #3   Title Pt will improve grip strength to atleast 20lbs in her LUE for improved ability to open water bottles   Status On-going   OT SHORT TERM GOAL #4   Title Pt will improve 3-point pinch to atleast 6lbs for improved ability to manage shirt buttons.   Status On-going            Plan - 08/15/14 1512    Clinical Impression Statement Focused today's session on general shoulder and proximal shoulder strengthening.  Pt tolerated well, but with some fatigue.  Added ball on wall with good tolerance.  Required additional cueing for pro/ret/elev/dep.  pt will benefit from continued shoulder  strengthening.       Plan Re-evaluate! add scapular theraband.  continue proximal shoulder strengthening. add functional reaching activity.         Problem List Patient Active Problem List   Diagnosis Date Noted  . CVA (cerebral vascular accident) 07/05/2014  . Seborrheic keratoses, inflamed 03/08/2014  . Decreased hearing 03/08/2014  . OA (osteoarthritis) 03/07/2014  . Rhinosinusitis 11/09/2013  . Generalized OA 04/11/2013  . Pes anserinus bursitis of left knee 12/28/2012  . Abnormality of gait 05/18/2012  . Lower back pain 05/11/2012  . Essential hypertension, benign 04/17/2012  . Hip pain, bilateral 04/17/2012  . Hyperlipidemia 04/17/2012  . H/O: stroke 04/17/2012  . Iron deficiency anemia 04/17/2012    Marry Guan Terrah Decoster, MS, OTR/L [facility] [phone] 08/15/2014, 4:47 PM

## 2014-08-15 NOTE — Therapy (Signed)
[facility] [address] [place], [place], [address] Phone: [phone]   Fax:  [phone]  Physical Therapy Re-Evaluation  Patient Details  Name: Dawn Church MRN: [id number] Date of Birth: [birth year]  Encounter Date: 08/15/2014      PT End of Session - 08/15/14 1409    Visit Number 9   Number of Visits 16   Date for PT Re-Evaluation 09/14/14   Authorization Type UHC Medicare   Authorization - Visit Number 9   Authorization - Number of Visits 19   PT Start Time 1350   PT Stop Time 1431   PT Time Calculation (min) 41 min   Activity Tolerance Patient tolerated treatment well   Behavior During Therapy [facility] for tasks assessed/performed      Past Medical History  Diagnosis Date  . TIA (transient ischemic attack)   . Epigastric pain   . Stomach ulcer   . Hemochromatosis   . Anemia   . CVA (cerebral infarction)   . Hypercholesteremia   . Hypertension   . Hx of seasonal allergies     Past Surgical History  Procedure Laterality Date  . Vaginal hysterectomy    . Appendectomy    . Esophagogastroduodenoscopy  12/20/2010  . Breast biopsy      Bilateral  . Breast biopsy      bilateral  . Shoulder open rotator cuff repair      right   . Cataract extraction, bilateral      There were no vitals taken for this visit.  Visit Diagnosis:  Lack of coordination due to stroke  Weakness generalized  Abnormality of gait  Difficulty balancing  Weakness of left leg  Difficulty walking      Subjective Assessment - 08/15/14 1409    Symptoms Patinet notes improved balance and coordination. no pain this session.    Currently in Pain? No/denies          [facility] PT Assessment - 08/15/14 0001    Assessment   Medical Diagnosis Gait instability secondary to Lt LE weakness and stiffness follwoign stroke.    Onset Date 05/18/14   Next MD Visit [facility], 09/26/14   Observation/Other Assessments   Focus on Therapeutic Outcomes (FOTO)  FOTO 35%  limeted, was  52% limitation   Sit to Stand   Comments 5x sit to stand: 14.67 was  20.9   Strength   Right Hip Flexion 5/5   Right Hip Extension 4/5   Right Hip ABduction 3+/5   Left Hip Flexion 5/5   Left Hip Extension 3+/5   Left Hip ABduction 3-/5   Right Knee Flexion --  4+/5   Right Knee Extension 5/5   Left Knee Flexion 4/5   Left Knee Extension 5/5   Right Ankle Dorsiflexion 5/5   Left Ankle Dorsiflexion 5/5   Berg Balance Test   Sit to Stand Able to stand without using hands and stabilize independently   Standing Unsupported Able to stand safely 2 minutes   Sitting with Back Unsupported but Feet Supported on Floor or Stool Able to sit safely and securely 2 minutes   Stand to Sit Sits safely with minimal use of hands   Transfers Able to transfer safely, minor use of hands   Standing Unsupported with Eyes Closed Able to stand 10 seconds safely   Standing Ubsupported with Feet Together Able to place feet together independently and stand 1 minute safely   From Standing, Reach Forward with Outstretched Arm Can reach confidently >  25 cm (10")   From Standing Position, Pick up Object from [place] to pick up shoe safely and easily   From Standing Position, Turn to Look Behind Over each Shoulder Looks behind from both sides and weight shifts well   Turn 360 Degrees Able to turn 360 degrees safely one side only in 4 seconds or less   Standing Unsupported, Alternately Place Feet on Step/Stool Able to stand independently and safely and complete 8 steps in 20 seconds   Standing Unsupported, One Foot in [place] to place foot tandem independently and hold 30 seconds   Standing on One Leg Able to lift leg independently and hold > 10 seconds   Total Score 55              PT Short Term Goals - 2014/08/29 1434    PT SHORT TERM GOAL #1   Title Patient will demosntrate improved 5x sit to stand to <15 seconds indicating patient not at high falls risk    Status Achieved   PT SHORT  TERM GOAL #2   Title Patient will demonstrate BERG balance score of 54/56 indicating improved balance and patient not at high falls risk   Status Achieved   PT SHORT TERM GOAL #3   Title hip extension strength demonstrate a 3+/5 MMT to be able to more easily go up and down stairs   Status On-going          PT Long Term Goals - 08/29/14 1434    PT LONG TERM GOAL #1   Title Hip abduction strength to improve to 3+/5 to be able to perform single leg stand without loss of balance.   Status Achieved   PT LONG TERM GOAL #2   Title Patient will state independence with home walkign program and able to walk at least 3 miles at a time without resting.    Status On-going   PT LONG TERM GOAL #3   Title Patient will state she is no longer afraid of falling   Status On-going          Plan - August 29, 2014 1431    Clinical Impression Statement Dawn Church has made great progress towards all goals wth improvign LE strength and balance. Patient will cotninue to benefit from skilled physical therpay to increase dynamic balance, strength , and endurance so patient can return to com,unity ambulatory status. Exercises this session focused on dynamic balance and multiplanar stability.    PT Next Visit Plan Continue with current PT POC.  Continue stair training 6in, begin step down and continue squat reach matrix.  Progress to dynamic surface for balance training.  Progress lunge matrix to pivotsd to improve ability to change directions durign gait.           G-Codes - 2014-08-29 1435    Functional Assessment Tool Used FOTO 35% limited   Functional Limitation Mobility: Walking and moving around   Mobility: Walking and Moving Around Current Status [phone]) At least 20 percent but less than 40 percent impaired, limited or restricted   Mobility: Walking and Moving Around Goal Status [phone]) At least 1 percent but less than 20 percent impaired, limited or restricted        Problem List Patient Active Problem List    Diagnosis Date Noted  . CVA (cerebral vascular accident) 07/05/2014  . Seborrheic keratoses, inflamed 03/08/2014  . Decreased hearing 03/08/2014  . OA (osteoarthritis) 03/07/2014  . Rhinosinusitis 11/09/2013  . Generalized OA 04/11/2013  . Pes anserinus  bursitis of left knee 12/28/2012  . Abnormality of gait 05/18/2012  . Lower back pain 05/11/2012  . Essential hypertension, benign 04/17/2012  . Hip pain, bilateral 04/17/2012  . Hyperlipidemia 04/17/2012  . H/O: stroke 04/17/2012  . Iron deficiency anemia 04/17/2012   Devona Konig PT DPT [phone]

## 2014-08-16 ENCOUNTER — Encounter (HOSPITAL_COMMUNITY): Payer: Medicare Other | Admitting: Physical Therapy

## 2014-08-16 ENCOUNTER — Encounter (HOSPITAL_COMMUNITY): Payer: Self-pay

## 2014-08-17 ENCOUNTER — Ambulatory Visit (HOSPITAL_COMMUNITY)
Admission: RE | Admit: 2014-08-17 | Discharge: 2014-08-17 | Disposition: A | Discharge status: Home / Self Care | Payer: Medicare Other | Source: Ambulatory Visit | Attending: Family Medicine | Admitting: Family Medicine

## 2014-08-17 ENCOUNTER — Encounter (HOSPITAL_COMMUNITY): Payer: Self-pay

## 2014-08-17 DIAGNOSIS — R269 Unspecified abnormalities of gait and mobility: Secondary | ICD-10-CM

## 2014-08-17 DIAGNOSIS — Z5189 Encounter for other specified aftercare: Secondary | ICD-10-CM | POA: Diagnosis not present

## 2014-08-17 DIAGNOSIS — R262 Difficulty in walking, not elsewhere classified: Secondary | ICD-10-CM

## 2014-08-17 DIAGNOSIS — IMO0002 Reserved for concepts with insufficient information to code with codable children: Secondary | ICD-10-CM

## 2014-08-17 DIAGNOSIS — R29898 Other symptoms and signs involving the musculoskeletal system: Secondary | ICD-10-CM

## 2014-08-17 DIAGNOSIS — R531 Weakness: Secondary | ICD-10-CM

## 2014-08-17 DIAGNOSIS — R29818 Other symptoms and signs involving the nervous system: Secondary | ICD-10-CM

## 2014-08-17 NOTE — Patient Instructions (Signed)
Shoulder Exercises  -Complete 10-15 reps using soup cans, water bottles, or 1-2 lb hand weights -Not pictured: punches forward, punches overhead  ROM: Abduction (Standing)   Bring arms straight out from sides and raise as high as possible without pain.  http://orth.exer.us/910   Copyright  VHI. All rights reserved.   Extension (Active) ROM: Extension (Standing)   Bring arms straight back as far as possible without pain. Squeeze shoulder blades together.  http://orth.exer.us/916   Copyright  VHI. All rights reserved.   ROM: External / Internal Rotation - in Abduction (Standing)   With upper arms parallel to floor and elbows bent at right angles, gently rotate arms up then down as far as possible without pain.  **Alternate completed in therapy: keep elbow bent and by your sides, with forearms parallel to the floor. Rotate arms in across stomach and out to the side again, keep elbow bent.  http://orth.exer.us/912   Copyright  VHI. All rights reserved.    Flexors Stretch (Active)   Stand, arms straight at sides. Bring arms straight forward and upward as high as possible without pain.   Copyright  VHI. All rights reserved.    AROM: Forearm Pronation / Supination    With right arm in handshake position, slowly rotate palm down until stretch is felt. Relax. Then rotate palm up until stretch is felt.  Copyright  VHI. All rights reserved.   AROM: Elbow Flexion / Extension   With left hand palm up, gently bend elbow as far as possible. Then straighten arm as far as possible. (can complete at table, or simply with arm at your side)  Copyright  VHI. All rights reserved.    Bea Graff Tashiana Lamarca, MS, OTR/L [place]

## 2014-08-17 NOTE — Therapy (Signed)
[facility] [address] [place], [place], [address] Phone: [phone]   Fax:  [phone]  Occupational Therapy Treatment  Patient Details  Name: SHERITA PICKELL MRN: [id number] Date of Birth: [birth year]  Encounter Date: 08/17/2014      OT End of Session - 08/17/14 1144    Visit Number 9   Number of Visits 8   Authorization Type AARP Medicare Complete   Authorization Time Period Before 10th visit   Authorization - Visit Number 9   Authorization - Number of Visits 10   OT Start Time 1103   OT Stop Time 1142   OT Time Calculation (min) 39 min   Activity Tolerance Patient tolerated treatment well   Behavior During Therapy [facility] for tasks assessed/performed      Past Medical History  Diagnosis Date  . TIA (transient ischemic attack)   . Epigastric pain   . Stomach ulcer   . Hemochromatosis   . Anemia   . CVA (cerebral infarction)   . Hypercholesteremia   . Hypertension   . Hx of seasonal allergies     Past Surgical History  Procedure Laterality Date  . Vaginal hysterectomy    . Appendectomy    . Esophagogastroduodenoscopy  12/20/2010  . Breast biopsy      Bilateral  . Breast biopsy      bilateral  . Shoulder open rotator cuff repair      right   . Cataract extraction, bilateral      There were no vitals taken for this visit.  Visit Diagnosis:  Lack of coordination due to stroke  Weakness generalized      Subjective Assessment - 08/17/14 1105    Symptoms "i'm doing better. my muscle are more flexible."   Currently in Pain? No/denies          [facility] OT Assessment - 08/17/14 0001    Assessment   Diagnosis L hemiparesis   Precautions   Precautions None   ADL   ADL comments pt reports improved ability to open water bottles and buttons.  pt rports some inprovements in IADL tasks.  Fixing her hair remains difficult.     Observation/Other Assessments   Quick DASH  43.18  63.63   Coordination   Right 9 Hole Peg Test R  22.26 seconds   Left 9 Hole Peg Test L 30.7 seconds   Strength   Overall Strength Comments (Previous Eval measurements 11/17)   Left Shoulder Flexion 4+/5  4/5   Left Shoulder ABduction 4+/5  4-/5   Left Shoulder Internal Rotation 5/5  4-/5   Left Shoulder External Rotation 4+/5  4-/5   Left Elbow Flexion 5/5  4+/5   Left Elbow Extension 5/5  4+/5   Left Forearm Pronation 5/5  4+/5   Left Forearm Supination 5/5  4+/5   Left Wrist Flexion 5/5  4+/5   Left Wrist Extension 5/5  4+/5   Grip (lbs) --  R 25   Right Hand Lateral Pinch --  5   Right Hand 3 Point Pinch --  7   Grip (lbs) L 25   Left Hand Lateral Pinch 7 lbs  4   Left Hand 3 Point Pinch 6 lbs  4            OT Education - 08/17/14 1143    Education provided Yes   Education Details HEP - seated AROM   Person(s) Educated Patient   Methods Explanation;Demonstration;Handout   Comprehension Verbalized understanding;Returned  demonstration          OT Short Term Goals - 08/17/14 1126    OT SHORT TERM GOAL #1   Title Pt will be educated on HEP   Status Achieved   OT SHORT TERM GOAL #2   Title Pt will improve LUE overall strength to atleast 4+/5 for improved ability to hold her hands behind her head when fixing her hair.   Status Achieved   OT SHORT TERM GOAL #3   Title Pt will improve grip strength to atleast 20lbs in her LUE for improved ability to open water bottles   Status Achieved   OT SHORT TERM GOAL #4   Title Pt will improve 3-point pinch to atleast 6lbs for improved ability to manage shirt buttons.   Status Achieved            Plan - 08/17/14 1144    Clinical Impression Statement Re-evaluation completed this session.  Pt has progressed well in OT services.  She ahs met all STG.  Pt indicates she is able to complete needed ADLs with less difficulty than before. Educated pt on AROM exercises to continue at home for HEP, and pt reports that she is continuing to use the theraputty.  Pt is  in agreement with OT discharge.     Plan Pt is discharged from OT services.       Problem List Patient Active Problem List   Diagnosis Date Noted  . CVA (cerebral vascular accident) 07/05/2014  . Seborrheic keratoses, inflamed 03/08/2014  . Decreased hearing 03/08/2014  . OA (osteoarthritis) 03/07/2014  . Rhinosinusitis 11/09/2013  . Generalized OA 04/11/2013  . Pes anserinus bursitis of left knee 12/28/2012  . Abnormality of gait 05/18/2012  . Lower back pain 05/11/2012  . Essential hypertension, benign 04/17/2012  . Hip pain, bilateral 04/17/2012  . Hyperlipidemia 04/17/2012  . H/O: stroke 04/17/2012  . Iron deficiency anemia 04/17/2012        OCCUPATIONAL THERAPY DISCHARGE SUMMARY  Visits from Start of Care: 9  Current functional level related to goals / functional outcomes: Pt has met all goals.  Quick DASH 43.18   Remaining deficits: 4+/5 strength throughout LUE.  WFL grip and pinch.   Education / Equipment: Updated HEP for green theraputty and UE AROM.  Plan: Patient agrees to discharge.  Patient goals were met. Patient is being discharged due to meeting the stated rehab goals.  ?????           Marry Guan Lyndzee Kliebert, MS, OTR/L [facility] [phone] 08/17/2014, 11:53 AM

## 2014-08-17 NOTE — Therapy (Signed)
[facility] [address] [place], [place], [address] Phone: [phone]   Fax:  [phone]  Physical Therapy Treatment  Patient Details  Name: Dawn Church MRN: [id number] Date of Birth: 1928-01-15  Encounter Date: 08/17/2014      PT End of Session - 08/17/14 1230    Visit Number 10   Number of Visits 16   Date for PT Re-Evaluation 09/14/14   Authorization Type UHC Medicare   Authorization - Visit Number 10   Authorization - Number of Visits 19   PT Start Time 1150   PT Stop Time 1230   PT Time Calculation (min) 40 min   Activity Tolerance Patient tolerated treatment well   Behavior During Therapy [facility] for tasks assessed/performed      Past Medical History  Diagnosis Date  . TIA (transient ischemic attack)   . Epigastric pain   . Stomach ulcer   . Hemochromatosis   . Anemia   . CVA (cerebral infarction)   . Hypercholesteremia   . Hypertension   . Hx of seasonal allergies     Past Surgical History  Procedure Laterality Date  . Vaginal hysterectomy    . Appendectomy    . Esophagogastroduodenoscopy  12/20/2010  . Breast biopsy      Bilateral  . Breast biopsy      bilateral  . Shoulder open rotator cuff repair      right   . Cataract extraction, bilateral      There were no vitals taken for this visit.  Visit Diagnosis:  Lack of coordination due to stroke  Weakness generalized  Abnormality of gait  Difficulty balancing  Weakness of left leg  Difficulty walking      Subjective Assessment - 08/17/14 1218    Symptoms Patient notes minor Rt knee pain secondary to osteoarthrits, no pain on Lt    Currently in Pain? Yes   Pain Score 2    Pain Location Knee   Pain Orientation Right          [facility] PT Assessment - 08/17/14 1228    Assessment   Medical Diagnosis Gait instability secondary to Lt LE weakness and stiffness follwoign stroke.    Onset Date 05/18/14   Next MD Visit [facility], 09/26/14          [facility]  Adult PT Treatment/Exercise - 08/17/14 0001    Knee/Hip Exercises: Standing   Heel Raises Limitations heel and toe raises 20x   Forward Lunges Left;3 sets;5 reps   Forward Lunges Limitations to floor knee high reach, same side rotation, opposite side rotation   Side Lunges 3 sets;5 reps   Side Lunges Limitations to floor knee high reach, same side rotation, opposite side rotation   Forward Step Up Both;15 reps;Hand Hold: 1;Step Height: 6"   Functional Squat Limitations Squat matrix to chair 5x each (35 squats)    Other Standing Knee Exercises  tandem gait 26ft forwards and backwards. on airex   Other Standing Knee Exercises --            PT Short Term Goals - 08/17/14 1233    PT SHORT TERM GOAL #1   Title Patient will demosntrate improved 5x sit to stand to <15 seconds indicating patient not at high falls risk    Status Achieved   PT SHORT TERM GOAL #2   Title Patient will demonstrate BERG balance score of 54/56 indicating improved balance and patient not at high falls risk   Status Achieved  PT SHORT TERM GOAL #3   Title hip extension strength demonstrate a 3+/5 MMT to be able to more easily go up and down stairs   Status On-going          PT Long Term Goals - 08/17/14 1233    PT LONG TERM GOAL #1   Title Hip abduction strength to improve to 3+/5 to be able to perform single leg stand without loss of balance.   Status Achieved   PT LONG TERM GOAL #2   Title Patient will state independence with home walkign program and able to walk at least 3 miles at a time without resting.    Status On-going   PT LONG TERM GOAL #3   Title Patient will state she is no longer afraid of falling   Status On-going          Plan - 08/17/14 1230    Clinical Impression Statement Session focused on increasing dynamic stability with use of multiplanar sqautting and lunging with multi directional reaches to promote stability while doing multiple tasks at the same time. Patient notesd  increased fatigue today and concern for falling durng dynamic activities though patient had no loss of balance.    PT Next Visit Plan Continue with current PT POC.  Continue stair training 6in, begin step down. add airex pads durign squatting.  Progress lunge matrix to pivots with CGA to improve ability to change directions during gait.        Problem List Patient Active Problem List   Diagnosis Date Noted  . CVA (cerebral vascular accident) 07/05/2014  . Seborrheic keratoses, inflamed 03/08/2014  . Decreased hearing 03/08/2014  . OA (osteoarthritis) 03/07/2014  . Rhinosinusitis 11/09/2013  . Generalized OA 04/11/2013  . Pes anserinus bursitis of left knee 12/28/2012  . Abnormality of gait 05/18/2012  . Lower back pain 05/11/2012  . Essential hypertension, benign 04/17/2012  . Hip pain, bilateral 04/17/2012  . Hyperlipidemia 04/17/2012  . H/O: stroke 04/17/2012  . Iron deficiency anemia 04/17/2012   Devona Konig PT DPT [phone]

## 2014-08-22 ENCOUNTER — Ambulatory Visit (HOSPITAL_COMMUNITY)
Admission: RE | Admit: 2014-08-22 | Discharge: 2014-08-22 | Disposition: A | Discharge status: Home / Self Care | Payer: Medicare Other | Source: Ambulatory Visit | Attending: Family Medicine | Admitting: Family Medicine

## 2014-08-22 ENCOUNTER — Ambulatory Visit (HOSPITAL_COMMUNITY): Payer: Medicare Other

## 2014-08-22 DIAGNOSIS — R531 Weakness: Secondary | ICD-10-CM

## 2014-08-22 DIAGNOSIS — Z5189 Encounter for other specified aftercare: Secondary | ICD-10-CM | POA: Diagnosis not present

## 2014-08-22 DIAGNOSIS — IMO0002 Reserved for concepts with insufficient information to code with codable children: Secondary | ICD-10-CM

## 2014-08-22 DIAGNOSIS — R262 Difficulty in walking, not elsewhere classified: Secondary | ICD-10-CM

## 2014-08-22 DIAGNOSIS — R29818 Other symptoms and signs involving the nervous system: Secondary | ICD-10-CM

## 2014-08-22 DIAGNOSIS — R29898 Other symptoms and signs involving the musculoskeletal system: Secondary | ICD-10-CM

## 2014-08-22 DIAGNOSIS — R269 Unspecified abnormalities of gait and mobility: Secondary | ICD-10-CM

## 2014-08-22 NOTE — Therapy (Signed)
[facility] [address] [place], [place], [address] Phone: [phone]   Fax:  [phone]  Physical Therapy Treatment  Patient Details  Name: Dawn Church MRN: [id number] Date of Birth: 10/31/1927  Encounter Date: 08/22/2014      PT End of Session - 08/22/14 1439    Visit Number 11   Number of Visits 16   Date for PT Re-Evaluation 09/14/14   Authorization Type [employer] Medicare   Authorization - Visit Number 11   Authorization - Number of Visits 19   PT Start Time 7628   PT Stop Time 1438   PT Time Calculation (min) 43 min   Activity Tolerance Patient tolerated treatment well   Behavior During Therapy [facility] for tasks assessed/performed      Past Medical History  Diagnosis Date  . TIA (transient ischemic attack)   . Epigastric pain   . Stomach ulcer   . Hemochromatosis   . Anemia   . CVA (cerebral infarction)   . Hypercholesteremia   . Hypertension   . Hx of seasonal allergies     Past Surgical History  Procedure Laterality Date  . Vaginal hysterectomy    . Appendectomy    . Esophagogastroduodenoscopy  12/20/2010  . Breast biopsy      Bilateral  . Breast biopsy      bilateral  . Shoulder open rotator cuff repair      right   . Cataract extraction, bilateral      There were no vitals taken for this visit.  Visit Diagnosis:  Lack of coordination due to stroke  Weakness generalized  Abnormality of gait  Difficulty balancing  Weakness of left leg  Difficulty walking      Subjective Assessment - 08/22/14 1356    Symptoms Pt reports Rt knee aggrevation when she's on it alot but no real pain today.   Currently in Pain? No/denies                    [facility] Adult PT Treatment/Exercise - 08/22/14 1357    Knee/Hip Exercises: Standing   Heel Raises Limitations squat to step with red ball and heelraise 10 reps   Forward Lunges Both;10 reps   Forward Lunges Limitations to floor knee high reach, same  side rotation, opposite side rotation   Side Lunges Both;10 reps   Side Lunges Limitations to floor knee high reach, same side rotation, opposite side rotation   Lateral Step Up Both;10 reps;Hand Hold: 1;Step Height: 6"   Forward Step Up Both;10 reps;Hand Hold: 1;Step Height: 6"   Step Down Both;10 reps;Hand Hold: 1;Step Height: 4"   Functional Squat Limitations Squat matrix to chair 5x each (35 squats)    Lunge Walking - Round Trips squat walk arounds 5X each   Other Standing Knee Exercises  tandem gait (fwd/bkwd), retro, side stepping on airex 2RT each   Other Standing Knee Exercises heelwalk/toewalk 1RT each                  PT Short Term Goals - 08/17/14 1233    PT SHORT TERM GOAL #1   Title Patient will demosntrate improved 5x sit to stand to <15 seconds indicating patient not at high falls risk    Status Achieved   PT SHORT TERM GOAL #2   Title Patient will demonstrate BERG balance score of 54/56 indicating improved balance and patient not at high falls risk   Status Achieved   PT SHORT  TERM GOAL #3   Title hip extension strength demonstrate a 3+/5 MMT to be able to more easily go up and down stairs   Status On-going           PT Long Term Goals - 08/17/14 1233    PT LONG TERM GOAL #1   Title Hip abduction strength to improve to 3+/5 to be able to perform single leg stand without loss of balance.   Status Achieved   PT LONG TERM GOAL #2   Title Patient will state independence with home walkign program and able to walk at least 3 miles at a time without resting.    Status On-going   PT LONG TERM GOAL #3   Title Patient will state she is no longer afraid of falling   Status On-going               Plan - 08/22/14 1440    Clinical Impression Statement Continued need for therapist faciltation due to improper form wtih squats and other actvities.  Pt with most difficulty completing tandem/retro tandem balance actvitiy on airex with several LOB.  Added squat  walk arounds with tactile cues and heel and toe walking to challenge balance.  Progressed to forward step downs to increase eccentric control; most difficulty with Rt LE.     PT Next Visit Plan Continue with current PT POC.  Continue stair training 6in and add airex pads durign squatting.  Progress lunge matrix to pivots with CGA to improve ability to change directions during gait.         Problem List Patient Active Problem List   Diagnosis Date Noted  . CVA (cerebral vascular accident) 07/05/2014  . Seborrheic keratoses, inflamed 03/08/2014  . Decreased hearing 03/08/2014  . OA (osteoarthritis) 03/07/2014  . Rhinosinusitis 11/09/2013  . Generalized OA 04/11/2013  . Pes anserinus bursitis of left knee 12/28/2012  . Abnormality of gait 05/18/2012  . Lower back pain 05/11/2012  . Essential hypertension, benign 04/17/2012  . Hip pain, bilateral 04/17/2012  . Hyperlipidemia 04/17/2012  . H/O: stroke 04/17/2012  . Iron deficiency anemia 04/17/2012    Teena Irani, PTA/CLT [phone] 08/22/2014, 2:43 PM  [facility] [address] [place], [place], [address] Phone: [phone]   Fax:  [phone]

## 2014-08-23 ENCOUNTER — Encounter (HOSPITAL_COMMUNITY): Payer: Self-pay

## 2014-08-23 ENCOUNTER — Encounter (HOSPITAL_COMMUNITY): Payer: Medicare Other | Admitting: Physical Therapy

## 2014-08-24 ENCOUNTER — Ambulatory Visit (HOSPITAL_COMMUNITY): Payer: Medicare Other | Admitting: Specialist

## 2014-08-24 ENCOUNTER — Ambulatory Visit (HOSPITAL_COMMUNITY)
Admission: RE | Admit: 2014-08-24 | Discharge: 2014-08-24 | Disposition: A | Discharge status: Home / Self Care | Payer: Medicare Other | Source: Ambulatory Visit | Attending: Family Medicine | Admitting: Family Medicine

## 2014-08-24 DIAGNOSIS — R531 Weakness: Secondary | ICD-10-CM

## 2014-08-24 DIAGNOSIS — R29898 Other symptoms and signs involving the musculoskeletal system: Secondary | ICD-10-CM

## 2014-08-24 DIAGNOSIS — R269 Unspecified abnormalities of gait and mobility: Secondary | ICD-10-CM

## 2014-08-24 DIAGNOSIS — R262 Difficulty in walking, not elsewhere classified: Secondary | ICD-10-CM

## 2014-08-24 DIAGNOSIS — R29818 Other symptoms and signs involving the nervous system: Secondary | ICD-10-CM

## 2014-08-24 DIAGNOSIS — IMO0002 Reserved for concepts with insufficient information to code with codable children: Secondary | ICD-10-CM

## 2014-08-24 DIAGNOSIS — Z5189 Encounter for other specified aftercare: Secondary | ICD-10-CM | POA: Diagnosis not present

## 2014-08-24 NOTE — Therapy (Signed)
[facility] [place], [place], [address] Phone: [phone]   Fax:  [phone]  Physical Therapy Treatment  Patient Details  Name: Dawn Church MRN: [id number] Date of Birth: 23-Jun-1928  Encounter Date: 08/24/2014      PT End of Session - 08/24/14 1230    Visit Number 12   Number of Visits 16   Date for PT Re-Evaluation 09/14/14   Authorization Type [employer] Medicare   Authorization - Visit Number 12   Authorization - Number of Visits 19   PT Start Time 1200   PT Stop Time 1240   PT Time Calculation (min) 40 min   Equipment Utilized During Treatment Gait belt   Activity Tolerance Patient tolerated treatment well      Past Medical History  Diagnosis Date  . TIA (transient ischemic attack)   . Epigastric pain   . Stomach ulcer   . Hemochromatosis   . Anemia   . CVA (cerebral infarction)   . Hypercholesteremia   . Hypertension   . Hx of seasonal allergies     Past Surgical History  Procedure Laterality Date  . Vaginal hysterectomy    . Appendectomy    . Esophagogastroduodenoscopy  12/20/2010  . Breast biopsy      Bilateral  . Breast biopsy      bilateral  . Shoulder open rotator cuff repair      right   . Cataract extraction, bilateral      There were no vitals taken for this visit.  Visit Diagnosis:  Lack of coordination due to stroke  Weakness generalized  Abnormality of gait  Difficulty balancing  Weakness of left leg  Difficulty walking      Subjective Assessment - 08/24/14 1218    Symptoms Pt continues to complain of her right knee during therapy; she states day to day activity her knee is fine.    Currently in Pain? Yes   Pain Score 2    Pain Location Knee            [facility] Adult PT Treatment/Exercise - 08/24/14 1205    Lumbar Exercises: Aerobic   Stationary Bike nustep hills level 4; 8:00    Knee/Hip Exercises: Standing   Heel Raises Limitations squat to step with red ball  and heelraise 10 reps   Forward Lunges Both;10 reps   Forward Lunges Limitations to floor 10 reps,  knee high reach, same side rotation 5 reps, opposite side rotation 5 reps   Side Lunges Both;10 reps   Side Lunges Limitations to floor knee high reach, same side rotation, opposite side rotation   Lateral Step Up Both;10 reps;Hand Hold: 1;Step Height: 6"   Forward Step Up Both;10 reps;Hand Hold: 1;Step Height: 6"   Step Down Both;10 reps;Hand Hold: 1;Step Height: 4"   Functional Squat Limitations Squat matrix to chair 5x each (15 squats)    Other Standing Knee Exercises  tandem gait (fwd/bkwd), retro, side stepping on airex 2RT each   Other Standing Knee Exercises heelwalk/toewalk 1RT each                  PT Short Term Goals - 08/17/14 1233    PT SHORT TERM GOAL #1   Title Patient will demosntrate improved 5x sit to stand to <15 seconds indicating patient not at high falls risk    Status Achieved   PT SHORT TERM GOAL #2   Title Patient will demonstrate BERG balance score of 54/56  indicating improved balance and patient not at high falls risk   Status Achieved   PT SHORT TERM GOAL #3   Title hip extension strength demonstrate a 3+/5 MMT to be able to more easily go up and down stairs   Status On-going           PT Long Term Goals - 08/17/14 1233    PT LONG TERM GOAL #1   Title Hip abduction strength to improve to 3+/5 to be able to perform single leg stand without loss of balance.   Status Achieved   PT LONG TERM GOAL #2   Title Patient will state independence with home walkign program and able to walk at least 3 miles at a time without resting.    Status On-going   PT LONG TERM GOAL #3   Title Patient will state she is no longer afraid of falling   Status On-going               Plan - 08/24/14 1231    Clinical Impression Statement Pt began nustep to attempt to increase patient cadence as pt ambulates with decreased speed.  Pt has increased pain in Rt knee  with activities we do with only one leg ie step ups evaluating therapist may want to consider modiying these activites.  Pt showed improved balance on the airex.    PT Frequency 2x / week   PT Duration 8 weeks   PT Next Visit Plan avoid activity that aggrevates pt Rt knee; work more balance ie tandem stance, narrow base of support with eyes closed, catching ball..        Problem List Patient Active Problem List   Diagnosis Date Noted  . CVA (cerebral vascular accident) 07/05/2014  . Seborrheic keratoses, inflamed 03/08/2014  . Decreased hearing 03/08/2014  . OA (osteoarthritis) 03/07/2014  . Rhinosinusitis 11/09/2013  . Generalized OA 04/11/2013  . Pes anserinus bursitis of left knee 12/28/2012  . Abnormality of gait 05/18/2012  . Lower back pain 05/11/2012  . Essential hypertension, benign 04/17/2012  . Hip pain, bilateral 04/17/2012  . Hyperlipidemia 04/17/2012  . H/O: stroke 04/17/2012  . Iron deficiency anemia 04/17/2012    RUSSELL,CINDY PT 08/24/2014, 12:36 PM  [facility] [address] [place], [place], [address] Phone: [phone]   Fax:  [phone]

## 2014-08-29 ENCOUNTER — Ambulatory Visit (HOSPITAL_COMMUNITY)
Admission: RE | Admit: 2014-08-29 | Discharge: 2014-08-29 | Disposition: A | Discharge status: Home / Self Care | Payer: Medicare Other | Source: Ambulatory Visit | Attending: Family Medicine | Admitting: Family Medicine

## 2014-08-29 ENCOUNTER — Encounter (HOSPITAL_COMMUNITY): Payer: Self-pay

## 2014-08-29 DIAGNOSIS — Z5189 Encounter for other specified aftercare: Secondary | ICD-10-CM | POA: Diagnosis not present

## 2014-08-29 DIAGNOSIS — R262 Difficulty in walking, not elsewhere classified: Secondary | ICD-10-CM

## 2014-08-29 DIAGNOSIS — R269 Unspecified abnormalities of gait and mobility: Secondary | ICD-10-CM

## 2014-08-29 DIAGNOSIS — R531 Weakness: Secondary | ICD-10-CM

## 2014-08-29 DIAGNOSIS — IMO0002 Reserved for concepts with insufficient information to code with codable children: Secondary | ICD-10-CM

## 2014-08-29 DIAGNOSIS — R29818 Other symptoms and signs involving the nervous system: Secondary | ICD-10-CM

## 2014-08-29 DIAGNOSIS — R29898 Other symptoms and signs involving the musculoskeletal system: Secondary | ICD-10-CM

## 2014-08-29 NOTE — Therapy (Signed)
[facility] [place], [place], [address] Phone: [phone]   Fax:  [phone]  Physical Therapy Treatment  Patient Details  Name: Dawn Church MRN: [id number] Date of Birth: February 20, 1928  Encounter Date: 08/29/2014      PT End of Session - 08/29/14 1500    Visit Number 13   Number of Visits 16   Date for PT Re-Evaluation 09/14/14   Authorization Type [employer] Medicare   Authorization - Visit Number 13   Authorization - Number of Visits 19   PT Start Time 1350   PT Stop Time 1438   PT Time Calculation (min) 48 min   Equipment Utilized During Treatment Gait belt   Activity Tolerance Patient tolerated treatment well   Behavior During Therapy [facility] for tasks assessed/performed      Past Medical History  Diagnosis Date  . TIA (transient ischemic attack)   . Epigastric pain   . Stomach ulcer   . Hemochromatosis   . Anemia   . CVA (cerebral infarction)   . Hypercholesteremia   . Hypertension   . Hx of seasonal allergies     Past Surgical History  Procedure Laterality Date  . Vaginal hysterectomy    . Appendectomy    . Esophagogastroduodenoscopy  12/20/2010  . Breast biopsy      Bilateral  . Breast biopsy      bilateral  . Shoulder open rotator cuff repair      right   . Cataract extraction, bilateral      There were no vitals taken for this visit.  Visit Diagnosis:  Lack of coordination due to stroke  Weakness generalized  Abnormality of gait  Difficulty balancing  Weakness of left leg  Difficulty walking      Subjective Assessment - 08/29/14 1354    Symptoms Pt stated she believe arthritis plays a big part in Rt knee pain, increased pain with the weather.     Currently in Pain? Yes   Pain Score 4    Pain Location Knee   Pain Orientation Right                    [facility] Adult PT Treatment/Exercise - 08/29/14 1438    Exercises   Exercises Knee/Hip   Lumbar Exercises: Aerobic   Stationary Bike nustep hills level 4; 10:00    Knee/Hip Exercises: Standing   Heel Raises Limitations squat to step with red ball and heelraise 15 reps   Functional Squat Limitations --   Other Standing Knee Exercises tandem stance 4x 30"   Other Standing Knee Exercises heelwalk/toewalk 1RT each; 4 sets Wide BOS,normal stance; NBOS (2sets) with eyes shut on airex 2 sets with 10" holds each position; throw and catch red theraball on trampaline 10 reps each times feet narrow BOS to tandem stance each foot behind on airex                  PT Short Term Goals - 08/29/14 1504    PT SHORT TERM GOAL #1   Title Patient will demosntrate improved 5x sit to stand to <15 seconds indicating patient not at high falls risk    Status Achieved   PT SHORT TERM GOAL #2   Title Patient will demonstrate BERG balance score of 54/56 indicating improved balance and patient not at high falls risk   Status Achieved   PT SHORT TERM GOAL #3   Title hip extension strength demonstrate a 3+/5  MMT to be able to more easily go up and down stairs   Status On-going           PT Long Term Goals - 08/29/14 1504    PT LONG TERM GOAL #1   Title Hip abduction strength to improve to 3+/5 to be able to perform single leg stand without loss of balance.   Status Achieved   PT LONG TERM GOAL #2   Title Patient will state independence with home walkign program and able to walk at least 3 miles at a time without resting.    Status On-going   PT LONG TERM GOAL #3   Title Patient will state she is no longer afraid of falling   Status On-going               Plan - 08/29/14 1500    Clinical Impression Statement Session focus on balance training to reduce risk of falls, verbal cueing to improve spatial awareness with decreased assistance required on static surface each rep and able to progress to dynamic surface.  Therex for gluteal strengthening with multimodal cueing with proper lifting from 16 in step to  reduce stress on Rt knee.  Pr reported decreased knee pain at end of session.  Continued with nustep for LE strengthening and activity tolerance, average SPM 55   PT Next Visit Plan Progress balance with dynamic surface.  Continue LE strengthening activities avoid activities that aggrevates Rt knee.          Problem List Patient Active Problem List   Diagnosis Date Noted  . CVA (cerebral vascular accident) 07/05/2014  . Seborrheic keratoses, inflamed 03/08/2014  . Decreased hearing 03/08/2014  . OA (osteoarthritis) 03/07/2014  . Rhinosinusitis 11/09/2013  . Generalized OA 04/11/2013  . Pes anserinus bursitis of left knee 12/28/2012  . Abnormality of gait 05/18/2012  . Lower back pain 05/11/2012  . Essential hypertension, benign 04/17/2012  . Hip pain, bilateral 04/17/2012  . Hyperlipidemia 04/17/2012  . H/O: stroke 04/17/2012  . Iron deficiency anemia 04/17/2012   Ihor Austin, [place] Aldona Lento 08/29/2014, 3:08 PM  [facility] [address] [place], [place], [address] Phone: [phone]   Fax:  [phone]

## 2014-08-30 ENCOUNTER — Encounter (HOSPITAL_COMMUNITY): Payer: Medicare Other | Admitting: Physical Therapy

## 2014-08-30 ENCOUNTER — Encounter (HOSPITAL_COMMUNITY): Payer: Self-pay

## 2014-08-31 ENCOUNTER — Ambulatory Visit (HOSPITAL_COMMUNITY)
Admission: RE | Admit: 2014-08-31 | Discharge: 2014-08-31 | Disposition: A | Discharge status: Home / Self Care | Payer: Medicare Other | Source: Ambulatory Visit | Attending: Family Medicine | Admitting: Family Medicine

## 2014-08-31 ENCOUNTER — Encounter (HOSPITAL_COMMUNITY): Payer: Medicare Other | Admitting: Specialist

## 2014-08-31 DIAGNOSIS — R269 Unspecified abnormalities of gait and mobility: Secondary | ICD-10-CM

## 2014-08-31 DIAGNOSIS — Z5189 Encounter for other specified aftercare: Secondary | ICD-10-CM | POA: Diagnosis not present

## 2014-08-31 DIAGNOSIS — R29898 Other symptoms and signs involving the musculoskeletal system: Secondary | ICD-10-CM

## 2014-08-31 DIAGNOSIS — R531 Weakness: Secondary | ICD-10-CM

## 2014-08-31 DIAGNOSIS — R29818 Other symptoms and signs involving the nervous system: Secondary | ICD-10-CM

## 2014-08-31 DIAGNOSIS — IMO0002 Reserved for concepts with insufficient information to code with codable children: Secondary | ICD-10-CM

## 2014-08-31 DIAGNOSIS — R262 Difficulty in walking, not elsewhere classified: Secondary | ICD-10-CM

## 2014-08-31 NOTE — Therapy (Signed)
[facility] [address] [place], [place], [address] Phone: [phone]   Fax:  [phone]  Physical Therapy Treatment  Patient Details  Name: Dawn Church MRN: [id number] Date of Birth: 27-Feb-1928  Encounter Date: 08/31/2014      PT End of Session - 08/31/14 1102    Visit Number 14   Number of Visits 16   Date for PT Re-Evaluation 09/14/14   Authorization Type UHC Medicare   Authorization - Visit Number 14   Authorization - Number of Visits 16   PT Start Time 1020   PT Stop Time 1108   PT Time Calculation (min) 48 min      Past Medical History  Diagnosis Date  . TIA (transient ischemic attack)   . Epigastric pain   . Stomach ulcer   . Hemochromatosis   . Anemia   . CVA (cerebral infarction)   . Hypercholesteremia   . Hypertension   . Hx of seasonal allergies     Past Surgical History  Procedure Laterality Date  . Vaginal hysterectomy    . Appendectomy    . Esophagogastroduodenoscopy  12/20/2010  . Breast biopsy      Bilateral  . Breast biopsy      bilateral  . Shoulder open rotator cuff repair      right   . Cataract extraction, bilateral      There were no vitals taken for this visit.  Visit Diagnosis:  Lack of coordination due to stroke  Weakness generalized  Abnormality of gait  Difficulty balancing  Weakness of left leg  Difficulty walking      Subjective Assessment - 08/31/14 1046    Symptoms Pt states she is doing her exercises at home and has no questions.   Pain Score 5    Pain Location Knee   Pain Orientation Right             [facility] Adult PT Treatment/Exercise - 08/31/14 0001    Exercises   Exercises Balance   Lumbar Exercises: Aerobic   Stationary Bike nustep hills level 4; 10:00    Balance Exercises   Sidestepping 2 reps;Limitations   Sidestepping Limitations on foam   Tandem Walking 3 round trips;Limitations   Tandem Walking Limitations on foam   Retro Gait 3 reps    Retro Gait Limitations on foam   Step Over Hurdles / Cones 2 RT 4" hurdles   March on Foam/Wedge Limitations   March on Foam/Wedge Limitations standing;eyes closed with head turns x 10    SLS Eyes open;3 reps   Tandem Stance Eyes open;3 reps   Stand without Upper Extremity Support --  warrior I x 30 seconds x 2    Wall Bumps Shoulder   Wall Bumps Limitations as well as hips            PT Short Term Goals - 08/29/14 1504    PT SHORT TERM GOAL #1   Title Patient will demosntrate improved 5x sit to stand to <15 seconds indicating patient not at high falls risk    Status Achieved   PT SHORT TERM GOAL #2   Title Patient will demonstrate BERG balance score of 54/56 indicating improved balance and patient not at high falls risk   Status Achieved   PT SHORT TERM GOAL #3   Title hip extension strength demonstrate a 3+/5 MMT to be able to more easily go up and down stairs   Status On-going  PT Long Term Goals - 08/29/14 1504    PT LONG TERM GOAL #1   Title Hip abduction strength to improve to 3+/5 to be able to perform single leg stand without loss of balance.   Status Achieved   PT LONG TERM GOAL #2   Title Patient will state independence with home walkign program and able to walk at least 3 miles at a time without resting.    Status On-going   PT LONG TERM GOAL #3   Title Patient will state she is no longer afraid of falling   Status On-going               Plan - 08/31/14 1102    Clinical Impression Statement Pt tends to lose her balance with shoulders going backward(center of gravity getting behind base of support).  Pt worked on improving balance for the majority of this session woring on dynamic surfaced with therapist facilitation.  Pt continues to complain of Rt knee pain.  Encouraged to keep nustep above 60 spm to work on improving cadence of LE motion.     PT Next Visit Plan Progress balance with dynamic surface.  Continue LE strengthening activities  avoid activities that aggrevates Rt knee.          Problem List Patient Active Problem List   Diagnosis Date Noted  . CVA (cerebral vascular accident) 07/05/2014  . Seborrheic keratoses, inflamed 03/08/2014  . Decreased hearing 03/08/2014  . OA (osteoarthritis) 03/07/2014  . Rhinosinusitis 11/09/2013  . Generalized OA 04/11/2013  . Pes anserinus bursitis of left knee 12/28/2012  . Abnormality of gait 05/18/2012  . Lower back pain 05/11/2012  . Essential hypertension, benign 04/17/2012  . Hip pain, bilateral 04/17/2012  . Hyperlipidemia 04/17/2012  . H/O: stroke 04/17/2012  . Iron deficiency anemia 04/17/2012    RUSSELL,CINDYPT 08/31/2014, 11:06 AM  [facility] [address] [place], [place], [address] Phone: [phone]   Fax:  [phone]

## 2014-09-05 ENCOUNTER — Ambulatory Visit (HOSPITAL_COMMUNITY)
Admission: RE | Admit: 2014-09-05 | Discharge: 2014-09-05 | Disposition: A | Discharge status: Home / Self Care | Payer: Medicare Other | Source: Ambulatory Visit | Attending: Family Medicine | Admitting: Family Medicine

## 2014-09-05 ENCOUNTER — Encounter (HOSPITAL_COMMUNITY): Payer: Self-pay

## 2014-09-05 DIAGNOSIS — R262 Difficulty in walking, not elsewhere classified: Secondary | ICD-10-CM | POA: Insufficient documentation

## 2014-09-05 DIAGNOSIS — M6281 Muscle weakness (generalized): Secondary | ICD-10-CM | POA: Insufficient documentation

## 2014-09-05 DIAGNOSIS — R531 Weakness: Secondary | ICD-10-CM

## 2014-09-05 DIAGNOSIS — IMO0002 Reserved for concepts with insufficient information to code with codable children: Secondary | ICD-10-CM

## 2014-09-05 DIAGNOSIS — I69893 Ataxia following other cerebrovascular disease: Secondary | ICD-10-CM | POA: Diagnosis not present

## 2014-09-05 DIAGNOSIS — R29898 Other symptoms and signs involving the musculoskeletal system: Secondary | ICD-10-CM

## 2014-09-05 DIAGNOSIS — Z5189 Encounter for other specified aftercare: Secondary | ICD-10-CM | POA: Insufficient documentation

## 2014-09-05 DIAGNOSIS — R269 Unspecified abnormalities of gait and mobility: Secondary | ICD-10-CM

## 2014-09-05 DIAGNOSIS — M25652 Stiffness of left hip, not elsewhere classified: Secondary | ICD-10-CM | POA: Diagnosis not present

## 2014-09-05 DIAGNOSIS — R29818 Other symptoms and signs involving the nervous system: Secondary | ICD-10-CM

## 2014-09-05 NOTE — Therapy (Signed)
[facility] [place], [place], [address] Phone: [phone]   Fax:  [phone]  Physical Therapy Treatment  Patient Details  Name: Dawn Church MRN: [id number] Date of Birth: 1927-12-05  Encounter Date: 09/05/2014      PT End of Session - 09/05/14 1432    Visit Number 15   Number of Visits 16   Date for PT Re-Evaluation 09/14/14   Authorization Type [employer]HC Medicare   Authorization - Visit Number 15   Authorization - Number of Visits 16   PT Start Time 5885   PT Stop Time 1432   PT Time Calculation (min) 47 min      Past Medical History  Diagnosis Date  . TIA (transient ischemic attack)   . Epigastric pain   . Stomach ulcer   . Hemochromatosis   . Anemia   . CVA (cerebral infarction)   . Hypercholesteremia   . Hypertension   . Hx of seasonal allergies     Past Surgical History  Procedure Laterality Date  . Vaginal hysterectomy    . Appendectomy    . Esophagogastroduodenoscopy  12/20/2010  . Breast biopsy      Bilateral  . Breast biopsy      bilateral  . Shoulder open rotator cuff repair      right   . Cataract extraction, bilateral      There were no vitals taken for this visit.  Visit Diagnosis:  Lack of coordination due to stroke  Weakness generalized  Abnormality of gait  Difficulty balancing  Weakness of left leg  Difficulty walking      Subjective Assessment - 09/05/14 1514    Symptoms Pt reports she is doing well today with the same Rt knee pain.   Currently in Pain? Yes   Pain Score 5    Pain Location Knee                    [facility] Adult PT Treatment/Exercise - 09/05/14 1402    Exercises   Exercises Balance   Lumbar Exercises: Aerobic   Stationary Bike nustep hills level 4; 10:00    Knee/Hip Exercises: Standing   Heel Raises Limitations squat to 8" step with yellow ball and heelraise 15 reps   Forward Lunges Both;10 reps   Forward Lunges Limitations to floor 10  reps,  knee high reach, same side rotation 5 reps, opposite side rotation 5 reps   Side Lunges Both;10 reps   Side Lunges Limitations to floor knee high reach, same side rotation, opposite side rotation   Lateral Step Up Both;10 reps;Hand Hold: 1;Step Height: 6"   Forward Step Up Both;10 reps;Hand Hold: 1;Step Height: 6"   Step Down Both;10 reps;Hand Hold: 1;Step Height: 4"   Functional Squat Limitations Squat matrix to chair 5x each (15 squats)    Other Standing Knee Exercises tandem fwd/backward on balance beam 3RT                  PT Short Term Goals - 08/29/14 1504    PT SHORT TERM GOAL #1   Title Patient will demosntrate improved 5x sit to stand to <15 seconds indicating patient not at high falls risk    Status Achieved   PT SHORT TERM GOAL #2   Title Patient will demonstrate BERG balance score of 54/56 indicating improved balance and patient not at high falls risk   Status Achieved   PT SHORT TERM GOAL #3  Title hip extension strength demonstrate a 3+/5 MMT to be able to more easily go up and down stairs   Status On-going           PT Long Term Goals - 08/29/14 1504    PT LONG TERM GOAL #1   Title Hip abduction strength to improve to 3+/5 to be able to perform single leg stand without loss of balance.   Status Achieved   PT LONG TERM GOAL #2   Title Patient will state independence with home walkign program and able to walk at least 3 miles at a time without resting.    Status On-going   PT LONG TERM GOAL #3   Title Patient will state she is no longer afraid of falling   Status On-going               Plan - 09/05/14 1451    Clinical Impression Statement Continued to focus on increasing LE stability.  Pt requires max tactile and VC's with squats as tends to bend forward with trunk instead of bending knees.   Noted difficulty completing tasks relying more on Rt LE as knee continues to bother patient.     PT Next Visit Plan Re-evaluate next visit.         Problem List Patient Active Problem List   Diagnosis Date Noted  . CVA (cerebral vascular accident) 07/05/2014  . Seborrheic keratoses, inflamed 03/08/2014  . Decreased hearing 03/08/2014  . OA (osteoarthritis) 03/07/2014  . Rhinosinusitis 11/09/2013  . Generalized OA 04/11/2013  . Pes anserinus bursitis of left knee 12/28/2012  . Abnormality of gait 05/18/2012  . Lower back pain 05/11/2012  . Essential hypertension, benign 04/17/2012  . Hip pain, bilateral 04/17/2012  . Hyperlipidemia 04/17/2012  . H/O: stroke 04/17/2012  . Iron deficiency anemia 04/17/2012    Teena Irani, PTA/CLT [phone] 09/05/2014, 3:15 PM  [facility] [address] [place], [place], [address] Phone: [phone]   Fax:  [phone]

## 2014-09-06 ENCOUNTER — Encounter (HOSPITAL_COMMUNITY): Payer: Self-pay

## 2014-09-06 ENCOUNTER — Encounter (HOSPITAL_COMMUNITY): Payer: Medicare Other | Admitting: Physical Therapy

## 2014-09-07 ENCOUNTER — Ambulatory Visit (HOSPITAL_COMMUNITY)
Admission: RE | Admit: 2014-09-07 | Discharge: 2014-09-07 | Disposition: A | Discharge status: Home / Self Care | Payer: Medicare Other | Source: Ambulatory Visit | Attending: Family Medicine | Admitting: Family Medicine

## 2014-09-07 ENCOUNTER — Encounter (HOSPITAL_COMMUNITY): Payer: Medicare Other

## 2014-09-07 DIAGNOSIS — Z5189 Encounter for other specified aftercare: Secondary | ICD-10-CM | POA: Diagnosis not present

## 2014-09-07 DIAGNOSIS — R262 Difficulty in walking, not elsewhere classified: Secondary | ICD-10-CM

## 2014-09-07 DIAGNOSIS — R269 Unspecified abnormalities of gait and mobility: Secondary | ICD-10-CM

## 2014-09-07 DIAGNOSIS — R29818 Other symptoms and signs involving the nervous system: Secondary | ICD-10-CM

## 2014-09-07 DIAGNOSIS — R531 Weakness: Secondary | ICD-10-CM

## 2014-09-07 DIAGNOSIS — IMO0002 Reserved for concepts with insufficient information to code with codable children: Secondary | ICD-10-CM

## 2014-09-07 DIAGNOSIS — R29898 Other symptoms and signs involving the musculoskeletal system: Secondary | ICD-10-CM

## 2014-09-07 NOTE — Addendum Note (Signed)
Encounter addended by: Leia Alf, PT on: 09/07/2014  2:25 PM<BR>     Documentation filed: Episodes, Care Teams

## 2014-09-07 NOTE — Therapy (Signed)
[place], [place], [address] Phone: [phone]   Fax:  [phone]  Physical Therapy Treatment  Patient Details  Name: Dawn Church MRN: [id number] Date of Birth: [birth year] Referring Provider:  Alycia Rossetti, MD  Encounter Date: 09/07/2014      PT End of Session - 09/07/14 1348    Visit Number 16   Number of Visits 16   Date for PT Re-Evaluation 09/14/14   Authorization Type [employer]   Authorization - Visit Number 16   Authorization - Number of Visits 16   PT Start Time 6962   PT Stop Time 1430   PT Time Calculation (min) 45 min      Past Medical History  Diagnosis Date  . TIA (transient ischemic attack)   . Epigastric pain   . Stomach ulcer   . Hemochromatosis   . Anemia   . CVA (cerebral infarction)   . Hypercholesteremia   . Hypertension   . Hx of seasonal allergies     Past Surgical History  Procedure Laterality Date  . Vaginal hysterectomy    . Appendectomy    . Esophagogastroduodenoscopy  12/20/2010  . Breast biopsy      Bilateral  . Breast biopsy      bilateral  . Shoulder open rotator cuff repair      right   . Cataract extraction, bilateral      There were no vitals taken for this visit.  Visit Diagnosis:  Lack of coordination due to stroke  Weakness generalized  Difficulty balancing  Weakness of left leg  Difficulty walking  Abnormality of gait      Subjective Assessment - 09/07/14 1354    Symptoms I feel like I am movign a lot better, its not perfoect yet, but i feel a whole lot better. "I feel like i have met most of my treatment goals.   Currently in Pain? Yes   Pain Score 5    Pain Location Knee   Pain Orientation Right          [facility] PT Assessment - 09/07/14 0001    Observation/Other Assessments   Focus on Therapeutic Outcomes (FOTO)  FOTO 35% limited, was  52% limitation   Sit to Stand   Comments 5x sit to stand: 12.9  was  20.9   Strength   Right Hip Flexion 5/5   Right Hip Extension 4/5   Right Hip ABduction --  4-./5   Left Hip Flexion 5/5   Left Hip Extension 4/5   Left Hip ABduction --  4-/5   Right Knee Flexion 5/5   Right Knee Extension 5/5   Left Knee Flexion 4/5   Left Knee Extension 5/5   Right Ankle Dorsiflexion 5/5   Left Ankle Dorsiflexion 5/5   Berg Balance Test   Sit to Stand Able to stand without using hands and stabilize independently   Standing Unsupported Able to stand safely 2 minutes   Sitting with Back Unsupported but Feet Supported on Floor or Stool Able to sit safely and securely 2 minutes   Stand to Sit Sits safely with minimal use of hands   Transfers Able to transfer safely, minor use of hands   Standing Unsupported with Eyes Closed Able to stand 10 seconds safely   Standing Ubsupported with Feet Together Able to place feet together independently and stand 1 minute safely   From Standing, Reach Forward with Outstretched Arm Can reach confidently >25  cm (10")   From Standing Position, Pick up Object from [place] to pick up shoe safely and easily   From Standing Position, Turn to Look Behind Over each Shoulder Looks behind from both sides and weight shifts well   Turn 360 Degrees Able to turn 360 degrees safely one side only in 4 seconds or less   Standing Unsupported, Alternately Place Feet on Step/Stool Able to stand independently and safely and complete 8 steps in 20 seconds   Standing Unsupported, One Foot in [place] to place foot tandem independently and hold 30 seconds   Standing on One Leg Able to lift leg independently and hold > 10 seconds   Total Score 55             [facility] Adult PT Treatment/Exercise - 09/29/2014 0001    Lumbar Exercises: Aerobic   Stationary Bike nustep hills level 2; 90RPM 10:00    Knee/Hip Exercises: Standing   Heel Raises Limitations squat to 8" step with yellow ball and heelraise 15 reps   Forward Lunges Both;10 reps   Forward Lunges  Limitations to floor 10 reps,  knee high reach, same side rotation 5 reps, opposite side rotation 5 reps   Side Lunges Both;10 reps   Side Lunges Limitations to floor knee high reach, same side rotation, opposite side rotation   Functional Squat Limitations Squat reach matrix to chair 5x each (15 squats)             PT Short Term Goals - 09-29-2014 1404    PT SHORT TERM GOAL #1   Title Patient will demosntrate improved 5x sit to stand to <15 seconds indicating patient not at high falls risk    Status Achieved   PT SHORT TERM GOAL #2   Title Patient will demonstrate BERG balance score of 54/56 indicating improved balance and patient not at high falls risk   Status Achieved   PT SHORT TERM GOAL #3   Title hip extension strength demonstrate a 3+/5 MMT to be able to more easily go up and down stairs   Status Achieved           PT Long Term Goals - 09/29/14 1404    PT LONG TERM GOAL #1   Title Hip abduction strength to improve to 3+/5 to be able to perform single leg stand without loss of balance.   Status Achieved   PT LONG TERM GOAL #2   Title Patient will state independence with home walkign program and able to walk at least 3 miles at a time without resting.    Baseline unable to walk >60mnutes   Status Not Met   PT LONG TERM GOAL #3   Title Patient will state she is no longer afraid of falling   Status Achieved             G-Codes - 001/29/20161354    Functional Assessment Tool Used FOTO 35% limited   Functional Limitation Mobility: Walking and moving around   Mobility: Walking and Moving Around Current Status (([id number] At least 20 percent but less than 40 percent impaired, limited or restricted   Mobility: Walking and Moving Around Goal Status [phone] At least 1 percent but less than 20 percent impaired, limited or restricted      Problem List Patient Active Problem List   Diagnosis Date Noted  . CVA (cerebral vascular accident) 07/05/2014  . Seborrheic  keratoses, inflamed 03/08/2014  . Decreased hearing 03/08/2014  . OA (osteoarthritis)  03/07/2014  . Rhinosinusitis 11/09/2013  . Generalized OA 04/11/2013  . Pes anserinus bursitis of left knee 12/28/2012  . Abnormality of gait 05/18/2012  . Lower back pain 05/11/2012  . Essential hypertension, benign 04/17/2012  . Hip pain, bilateral 04/17/2012  . Hyperlipidemia 04/17/2012  . H/O: stroke 04/17/2012  . Iron deficiency anemia 04/17/2012    Devona Konig PT DPT [facility] [address] [place], [place], [address] Phone: [phone]   Fax:  [phone]     PHYSICAL THERAPY DISCHARGE SUMMARY  Visits from Start of Care: 16  Current functional level related to goals / functional outcomes: See above   Remaining deficits: Right knee pain unrelated to CVA    Plan: Patient agrees to discharge.  Patient goals were partially met. Patient is being discharged due to being pleased with the current functional level.  ?????         Devona Konig PT DPT [phone]

## 2014-09-12 ENCOUNTER — Ambulatory Visit (INDEPENDENT_AMBULATORY_CARE_PROVIDER_SITE_OTHER): Payer: 59 | Admitting: Family Medicine

## 2014-09-12 ENCOUNTER — Encounter (HOSPITAL_COMMUNITY): Payer: Medicare Other | Admitting: Physical Therapy

## 2014-09-12 ENCOUNTER — Encounter (HOSPITAL_COMMUNITY): Payer: Self-pay

## 2014-09-12 ENCOUNTER — Encounter: Payer: Self-pay | Admitting: Family Medicine

## 2014-09-12 VITALS — BP 118/68 | HR 72 | Temp 98.2°F | Resp 16 | Ht 62.0 in | Wt 173.0 lb

## 2014-09-12 DIAGNOSIS — I639 Cerebral infarction, unspecified: Secondary | ICD-10-CM

## 2014-09-12 DIAGNOSIS — M159 Polyosteoarthritis, unspecified: Secondary | ICD-10-CM

## 2014-09-12 DIAGNOSIS — M150 Primary generalized (osteo)arthritis: Secondary | ICD-10-CM

## 2014-09-12 DIAGNOSIS — I10 Essential (primary) hypertension: Secondary | ICD-10-CM

## 2014-09-12 DIAGNOSIS — E785 Hyperlipidemia, unspecified: Secondary | ICD-10-CM

## 2014-09-12 DIAGNOSIS — M8949 Other hypertrophic osteoarthropathy, multiple sites: Secondary | ICD-10-CM

## 2014-09-12 LAB — LIPID PANEL
Cholesterol: 147 mg/dL (ref 0–200)
HDL: 54 mg/dL (ref >39)
LDL Cholesterol: 71 mg/dL (ref 0–99)
Total CHOL/HDL Ratio: 2.7 Ratio
Triglycerides: 108 mg/dL (ref <150)
VLDL: 22 mg/dL (ref 0–40)

## 2014-09-12 LAB — COMPREHENSIVE METABOLIC PANEL
ALBUMIN: 3.7 g/dL (ref 3.5–5.2)
ALT: 14 U/L (ref 0–35)
AST: 23 U/L (ref 0–37)
Alkaline Phosphatase: 81 U/L (ref 39–117)
BUN: 18 mg/dL (ref 6–23)
CO2: 27 mEq/L (ref 19–32)
Calcium: 9.7 mg/dL (ref 8.4–10.5)
Chloride: 103 mEq/L (ref 96–112)
Creat: 0.97 mg/dL (ref 0.50–1.10)
Glucose, Bld: 91 mg/dL (ref 70–99)
POTASSIUM: 4 meq/L (ref 3.5–5.3)
SODIUM: 140 meq/L (ref 135–145)
Total Bilirubin: 0.5 mg/dL (ref 0.2–1.2)
Total Protein: 6.4 g/dL (ref 6.0–8.3)

## 2014-09-12 LAB — CBC WITH DIFFERENTIAL/PLATELET
BASOS ABS: 0 10*3/uL (ref 0.0–0.1)
Basophils Relative: 0 % (ref 0–1)
EOS ABS: 0.2 10*3/uL (ref 0.0–0.7)
EOS PCT: 2 % (ref 0–5)
HCT: 36.8 % (ref 36.0–46.0)
Hemoglobin: 12.5 g/dL (ref 12.0–15.0)
Lymphocytes Relative: 22 % (ref 12–46)
Lymphs Abs: 1.9 10*3/uL (ref 0.7–4.0)
MCH: 32.5 pg (ref 26.0–34.0)
MCHC: 34 g/dL (ref 30.0–36.0)
MCV: 95.6 fL (ref 78.0–100.0)
MPV: 9 fL (ref 8.6–12.4)
Monocytes Absolute: 0.6 10*3/uL (ref 0.1–1.0)
Monocytes Relative: 7 % (ref 3–12)
NEUTROS PCT: 69 % (ref 43–77)
Neutro Abs: 6.1 10*3/uL (ref 1.7–7.7)
Platelets: 231 10*3/uL (ref 150–400)
RBC: 3.85 MIL/uL — AB (ref 3.87–5.11)
RDW: 15.1 % (ref 11.5–15.5)
WBC: 8.8 10*3/uL (ref 4.0–10.5)

## 2014-09-12 MED ORDER — AZITHROMYCIN 250 MG PO TABS: ORAL_TABLET | ORAL | Status: DC

## 2014-09-12 NOTE — Progress Notes (Signed)
Patient ID: Dawn Church, female   DOB: Mar 27, 1928, 81 y.o.   MRN: [id number]   Subjective:    Patient ID: Dawn Church, female    DOB: September 14, 1927, 81 y.o.   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 She is status post skull therapy. Her carotid duplex showed less than 50% blockage she was started on Lipitor which she agreed to take. Her blood pressure is also much improved. She does not like to use her cane at home she still thinks that she can get around fairly well by herself. She does complain of right knee pain she has known osteoarthritis in the right knee and hip she had a knee injection a couple years ago which did well and she would like another one today.    Review Of Systems:  GEN- denies fatigue, fever, weight loss,weakness, recent illness HEENT- denies eye drainage, change in vision, nasal discharge, CVS- denies chest pain, palpitations RESP- denies SOB, cough, wheeze ABD- denies N/V, change in stools, abd pain GU- denies dysuria, hematuria, dribbling, incontinence MSK- + joint pain, muscle aches, injury Neuro- denies headache, dizziness, syncope, seizure activity       Objective:    BP 118/68 mmHg  Pulse 72  Temp(Src) 98.2 F (36.8 C) (Oral)  Resp 16  Ht 5\' 2"  (1.575 m)  Wt 173 lb (78.472 kg)  BMI 31.63 kg/m2 GEN- NAD, alert and oriented x3 HEENT- PERRL, EOMI, non injected sclera, pink conjunctiva, MMM, oropharynx clear CVS- RRR, no murmur RESP-CTAB MSK- Bilat knee- normal inspection, no effusion, decreased ROM, +crepitus EXT- No edema Pulses- Radial 2+  Procedure- Knee injection Procedure explained to patient questions answered benefits and risks discussed verbal consent obtained. Antiseptic-Betadine Injection- Kenalog 40mg  1cc, Lidocaine 1% 2cc, Marcaine 2.5% 2cc Minimal blood loss Patient  tolerated procedure well Bandage applied         Assessment & Plan:      Problem List Items Addressed This Visit      Unprioritized   Hyperlipidemia - Primary   Relevant Orders      Lipid panel   Essential hypertension, benign   Relevant Orders      CBC with Differential      Comprehensive metabolic panel   CVA (cerebral vascular accident)   Relevant Orders      Lipid panel      Note: This dictation was prepared with Dragon dictation along with smaller phrase technology. Any transcriptional errors that result from this process are unintentional.

## 2014-09-12 NOTE — Patient Instructions (Signed)
Take Robitussin DM for cough Azithromycin for the chest cold Knee injection today  We will call with lab results F/U 4 months

## 2014-09-12 NOTE — Assessment & Plan Note (Signed)
Osteoporosis of knees status post knee injection tolerated well. She will also continue Tylenol extra strength twice a day. She did not tolerate tramadol

## 2014-09-12 NOTE — Assessment & Plan Note (Addendum)
Doing well, completed PT, control risk factors Statin, Full dose ASA

## 2014-09-12 NOTE — Assessment & Plan Note (Signed)
Continue lipitor check FLP and LFT

## 2014-09-12 NOTE — Assessment & Plan Note (Signed)
BP well controlled.

## 2014-09-13 ENCOUNTER — Encounter (HOSPITAL_COMMUNITY): Payer: Self-pay

## 2014-09-13 ENCOUNTER — Encounter (HOSPITAL_COMMUNITY): Payer: Medicare Other

## 2014-09-14 ENCOUNTER — Encounter (HOSPITAL_COMMUNITY): Payer: Medicare Other

## 2014-09-18 ENCOUNTER — Encounter: Payer: Self-pay | Admitting: *Deleted

## 2014-09-19 ENCOUNTER — Encounter (HOSPITAL_COMMUNITY): Payer: Medicare Other | Admitting: Physical Therapy

## 2014-09-19 ENCOUNTER — Encounter (HOSPITAL_COMMUNITY): Payer: Self-pay

## 2014-09-20 ENCOUNTER — Encounter (HOSPITAL_COMMUNITY): Payer: Medicare Other | Admitting: Physical Therapy

## 2014-09-20 ENCOUNTER — Encounter (HOSPITAL_COMMUNITY): Payer: Self-pay

## 2014-09-21 ENCOUNTER — Encounter (HOSPITAL_COMMUNITY): Payer: Medicare Other | Admitting: Physical Therapy

## 2014-09-21 ENCOUNTER — Encounter (HOSPITAL_COMMUNITY): Payer: Medicare Other

## 2014-10-28 ENCOUNTER — Other Ambulatory Visit: Payer: Self-pay | Admitting: Family Medicine

## 2014-10-30 NOTE — Telephone Encounter (Signed)
Refill appropriate and filled per protocol. 

## 2014-11-23 ENCOUNTER — Other Ambulatory Visit: Payer: Self-pay | Admitting: Family Medicine

## 2014-11-23 NOTE — Telephone Encounter (Signed)
Medication refilled per protocol. 

## 2015-01-02 ENCOUNTER — Other Ambulatory Visit: Payer: Self-pay | Admitting: Family Medicine

## 2015-01-02 NOTE — Telephone Encounter (Signed)
Refill appropriate and filled per protocol. 

## 2015-01-12 ENCOUNTER — Encounter: Payer: Self-pay | Admitting: Family Medicine

## 2015-01-12 ENCOUNTER — Ambulatory Visit (INDEPENDENT_AMBULATORY_CARE_PROVIDER_SITE_OTHER): Payer: Medicare Other | Admitting: Family Medicine

## 2015-01-12 VITALS — BP 136/80 | HR 68 | Temp 97.8°F | Resp 16 | Ht 62.0 in | Wt 176.0 lb

## 2015-01-12 DIAGNOSIS — Z8673 Personal history of transient ischemic attack (TIA), and cerebral infarction without residual deficits: Secondary | ICD-10-CM

## 2015-01-12 DIAGNOSIS — R269 Unspecified abnormalities of gait and mobility: Secondary | ICD-10-CM

## 2015-01-12 DIAGNOSIS — I10 Essential (primary) hypertension: Secondary | ICD-10-CM | POA: Diagnosis not present

## 2015-01-12 DIAGNOSIS — M159 Polyosteoarthritis, unspecified: Secondary | ICD-10-CM | POA: Diagnosis not present

## 2015-01-12 DIAGNOSIS — E785 Hyperlipidemia, unspecified: Secondary | ICD-10-CM

## 2015-01-12 LAB — COMPREHENSIVE METABOLIC PANEL
ALK PHOS: 77 U/L (ref 39–117)
ALT: 14 U/L (ref 0–35)
AST: 25 U/L (ref 0–37)
Albumin: 4 g/dL (ref 3.5–5.2)
BUN: 17 mg/dL (ref 6–23)
CO2: 25 meq/L (ref 19–32)
Calcium: 10 mg/dL (ref 8.4–10.5)
Chloride: 102 mEq/L (ref 96–112)
Creat: 0.93 mg/dL (ref 0.50–1.10)
GLUCOSE: 104 mg/dL — AB (ref 70–99)
Potassium: 4 mEq/L (ref 3.5–5.3)
SODIUM: 141 meq/L (ref 135–145)
TOTAL PROTEIN: 6.7 g/dL (ref 6.0–8.3)
Total Bilirubin: 0.5 mg/dL (ref 0.2–1.2)

## 2015-01-12 LAB — CBC WITH DIFFERENTIAL/PLATELET
Basophils Absolute: 0 10*3/uL (ref 0.0–0.1)
Basophils Relative: 0 % (ref 0–1)
EOS PCT: 2 % (ref 0–5)
Eosinophils Absolute: 0.1 10*3/uL (ref 0.0–0.7)
HEMATOCRIT: 37.3 % (ref 36.0–46.0)
Hemoglobin: 12.4 g/dL (ref 12.0–15.0)
LYMPHS PCT: 24 % (ref 12–46)
Lymphs Abs: 1.6 10*3/uL (ref 0.7–4.0)
MCH: 32.3 pg (ref 26.0–34.0)
MCHC: 33.2 g/dL (ref 30.0–36.0)
MCV: 97.1 fL (ref 78.0–100.0)
MONO ABS: 0.4 10*3/uL (ref 0.1–1.0)
MPV: 9.2 fL (ref 8.6–12.4)
Monocytes Relative: 6 % (ref 3–12)
NEUTROS PCT: 68 % (ref 43–77)
Neutro Abs: 4.4 10*3/uL (ref 1.7–7.7)
Platelets: 230 10*3/uL (ref 150–400)
RBC: 3.84 MIL/uL — ABNORMAL LOW (ref 3.87–5.11)
RDW: 14.3 % (ref 11.5–15.5)
WBC: 6.5 10*3/uL (ref 4.0–10.5)

## 2015-01-12 LAB — LIPID PANEL
CHOL/HDL RATIO: 2.4 ratio
Cholesterol: 161 mg/dL (ref 0–200)
HDL: 66 mg/dL (ref >=46)
LDL CALC: 68 mg/dL (ref 0–99)
Triglycerides: 133 mg/dL (ref <150)
VLDL: 27 mg/dL (ref 0–40)

## 2015-01-12 NOTE — Patient Instructions (Signed)
Use cane at all times  Okay to use Fibro Cream We will call with lab results F/U 4 Months

## 2015-01-12 NOTE — Progress Notes (Signed)
Patient ID: Dawn Church, female   DOB: 21-Oct-1927, 81 y.o.   MRN: [id number]   Subjective:    Patient ID: Dawn Church, female    DOB: [birth year], 81 y.o.   MRN: [id number]  Patient presents for 4 month F/U  patient in follow-up chronic medical problems. No new concerns. She continues to have bilateral knee pain as well as shoulder pain and hip pain. She's been using some type of fibro cream which on review looks like an anti-inflammatory of some sort. She does state that her granddaughter who is 69 passed away suddenly about a week ago. There's been some stress in the family regarding this. She continues to have left-sided weakness from her previous stroke but she does not like to use her cane to his sister with walking. Her daughter accompanies her today. She is taken off her medications as prescribed.    Review Of Systems:  GEN- denies fatigue, fever, weight loss,weakness, recent illness HEENT- denies eye drainage, change in vision, nasal discharge, CVS- denies chest pain, palpitations RESP- denies SOB, cough, wheeze ABD- denies N/V, change in stools, abd pain GU- denies dysuria, hematuria, dribbling, incontinence MSK- + joint pain, muscle aches, injury Neuro- denies headache, dizziness, syncope, seizure activity       Objective:    BP 136/80 mmHg  Pulse 68  Temp(Src) 97.8 F (36.6 C) (Oral)  Resp 16  Ht 5\' 2"  (1.575 m)  Wt 176 lb (79.833 kg)  BMI 32.18 kg/m2 GEN- NAD, alert and oriented x3, unsteady gait HEENT- PERRL, EOMI, non injected sclera, pink conjunctiva, MMM, oropharynx clear Neck- Supple, fair ROM CVS- RRR, no murmur RESP-CTAB MSK- Decreased ROM UE, strength 4/5 LUE compared to Right, normal tone  ABD-NABS,soft,NT,ND EXT- No edema Pulses- Radial,  2+        Assessment & Plan:      Problem List Items Addressed This Visit    Hyperlipidemia - Primary   Relevant Orders   Comprehensive metabolic panel   Lipid panel   Generalized OA   Relevant  Medications   Acetaminophen (APAP ARTHRITIS PO)   Essential hypertension, benign   Relevant Orders   CBC with Differential/Platelet   Abnormality of gait      Note: This dictation was prepared with Dragon dictation along with smaller phrase technology. Any transcriptional errors that result from this process are unintentional.

## 2015-01-13 ENCOUNTER — Encounter: Payer: Self-pay | Admitting: Family Medicine

## 2015-01-13 NOTE — Assessment & Plan Note (Signed)
I reviewed cream, some type of NSAID okay to use Tylenol BID as well

## 2015-01-13 NOTE — Assessment & Plan Note (Signed)
Discussed with pt and family that she needs to use walking device, her stroke has left her weak and she has known OA in multiple other joints and high risk fo fall. She has both cane and walker at home

## 2015-01-13 NOTE — Assessment & Plan Note (Signed)
Well controlled, no change to meds 

## 2015-02-14 ENCOUNTER — Other Ambulatory Visit: Payer: Self-pay | Admitting: Family Medicine

## 2015-02-15 NOTE — Telephone Encounter (Signed)
Refill appropriate and filled per protocol. 

## 2015-03-31 ENCOUNTER — Other Ambulatory Visit: Payer: Self-pay | Admitting: Family Medicine

## 2015-04-02 NOTE — Telephone Encounter (Signed)
Medication refilled per protocol. 

## 2015-04-04 ENCOUNTER — Other Ambulatory Visit: Payer: Self-pay | Admitting: Family Medicine

## 2015-04-04 DIAGNOSIS — Z1231 Encounter for screening mammogram for malignant neoplasm of breast: Secondary | ICD-10-CM

## 2015-04-19 ENCOUNTER — Ambulatory Visit (INDEPENDENT_AMBULATORY_CARE_PROVIDER_SITE_OTHER): Payer: Medicare Other | Admitting: Otolaryngology

## 2015-04-19 ENCOUNTER — Other Ambulatory Visit: Payer: Self-pay | Admitting: Family Medicine

## 2015-04-19 DIAGNOSIS — H903 Sensorineural hearing loss, bilateral: Secondary | ICD-10-CM | POA: Diagnosis not present

## 2015-04-19 NOTE — Telephone Encounter (Signed)
Refill appropriate and filled per protocol. 

## 2015-05-08 ENCOUNTER — Encounter: Payer: Self-pay | Admitting: Family Medicine

## 2015-05-08 ENCOUNTER — Ambulatory Visit (INDEPENDENT_AMBULATORY_CARE_PROVIDER_SITE_OTHER): Payer: Medicare Other | Admitting: Family Medicine

## 2015-05-08 ENCOUNTER — Other Ambulatory Visit: Payer: Self-pay | Admitting: Family Medicine

## 2015-05-08 VITALS — BP 132/72 | HR 68 | Temp 98.5°F | Resp 14 | Ht 62.0 in | Wt 176.0 lb

## 2015-05-08 DIAGNOSIS — I10 Essential (primary) hypertension: Secondary | ICD-10-CM

## 2015-05-08 DIAGNOSIS — M25552 Pain in left hip: Secondary | ICD-10-CM

## 2015-05-08 DIAGNOSIS — R2681 Unsteadiness on feet: Secondary | ICD-10-CM | POA: Diagnosis not present

## 2015-05-08 DIAGNOSIS — M159 Polyosteoarthritis, unspecified: Secondary | ICD-10-CM

## 2015-05-08 DIAGNOSIS — W19XXXA Unspecified fall, initial encounter: Secondary | ICD-10-CM

## 2015-05-08 DIAGNOSIS — M25551 Pain in right hip: Secondary | ICD-10-CM | POA: Diagnosis not present

## 2015-05-08 DIAGNOSIS — M150 Primary generalized (osteo)arthritis: Secondary | ICD-10-CM | POA: Diagnosis not present

## 2015-05-08 DIAGNOSIS — Z23 Encounter for immunization: Secondary | ICD-10-CM

## 2015-05-08 DIAGNOSIS — M8949 Other hypertrophic osteoarthropathy, multiple sites: Secondary | ICD-10-CM

## 2015-05-08 NOTE — Assessment & Plan Note (Addendum)
Worsened pain x/p fall, obtain xray of hip Discussed ortho, she declines She will do physical therapy, therefore will get this set up Continue acetaminophen, use topical NSAID TID Use cane Family to purchase life alert, high risk for falls With muscle pain and length of time sitting in floor  likley has some mild Rhabdomyalsis

## 2015-05-08 NOTE — Assessment & Plan Note (Signed)
PT to assess, use cane at all times

## 2015-05-08 NOTE — Assessment & Plan Note (Signed)
Well controlled no change to meds 

## 2015-05-08 NOTE — Progress Notes (Signed)
Patient ID: Dawn Church, female   DOB: [birth year], 81 y.o.   MRN: [id number]   Subjective:    Patient ID: Dawn Church, female    DOB: 1927/12/04, 81 y.o.   MRN: [id number]  Patient presents for BLE Pain  patient here with right side and hip pain worse than typical. About 2-3 weeks ago she was actually turning to get out of the bed where she states she slid to the floor however she was unable to get up and was lying on the floor for about 4 hours she has no upper body strength and was unable to lift herself up. It took 2 of her children to finally get her up to the bed. Since then she's been very sore and had increased pain in the right hip. She has been taking acetaminophen but has not been using the topical anti-inflammatory. She does not tolerate Sharp pain medication above acetaminophen. She's also had some knee pain please feels like her typical osteoarthritis pain bilaterally. She denies any loss of consciousness during the entire event.    Review Of Systems:  GEN- denies fatigue, fever, weight loss,weakness, recent illness HEENT- denies eye drainage, change in vision, nasal discharge, CVS- denies chest pain, palpitations RESP- denies SOB, cough, wheeze ABD- denies N/V, change in stools, abd pain GU- denies dysuria, hematuria, dribbling, incontinence MSK- + joint pain, +muscle aches, injury Neuro- denies headache, dizziness, syncope, seizure activity       Objective:    BP 132/72 mmHg  Pulse 68  Temp(Src) 98.5 F (36.9 C) (Oral)  Resp 14  Ht 5\' 2"  (1.575 m)  Wt 176 lb (79.833 kg)  BMI 32.18 kg/m2 GEN- NAD, alert and oriented x3, unsteady gait HEENT- PERRL, EOMI, non injected sclera, pink conjunctiva, MMM, oropharynx clear Neck- Supple, fair ROM CVS- RRR, no murmur RESP-CTAB MSK- Decreased ROM UE and LE, Mild TTP over right Hip, fair ROM bilat knees, no effusion, lumbar spine NT,  strength 4/5 LUE compared to Right, normal tone  ABD-NABS,soft,NT,ND EXT- No  edema Pulses- Radial,  2+      Assessment & Plan:      Problem List Items Addressed This Visit    OA (osteoarthritis) - Primary   Relevant Orders   DG HIPS BILAT W OR W/O PELVIS 2V   Hip pain, bilateral   Relevant Orders   DG HIPS BILAT W OR W/O PELVIS 2V   Essential hypertension, benign   Relevant Orders   COMPLETE METABOLIC PANEL WITH GFR   CBC    Other Visit Diagnoses    Fall, initial encounter        Relevant Orders    CK    Need for prophylactic vaccination and inoculation against influenza        Relevant Orders    Flu Vaccine QUAD 36+ mos PF IM (Fluarix & Fluzone Quad PF) (Completed)       Note: This dictation was prepared with Dragon dictation along with smaller phrase technology. Any transcriptional errors that result from this process are unintentional.

## 2015-05-08 NOTE — Patient Instructions (Addendum)
Xray of Hip to be done Physical therapy referral  Use the cream twice a day  Take the Tylenol twice a day  F/U as previous

## 2015-05-09 ENCOUNTER — Ambulatory Visit (HOSPITAL_COMMUNITY)
Admission: RE | Admit: 2015-05-09 | Discharge: 2015-05-09 | Disposition: A | Discharge status: Home / Self Care | Payer: Medicare Other | Source: Ambulatory Visit | Attending: Family Medicine | Admitting: Family Medicine

## 2015-05-09 DIAGNOSIS — M25551 Pain in right hip: Secondary | ICD-10-CM | POA: Diagnosis not present

## 2015-05-09 DIAGNOSIS — M25552 Pain in left hip: Secondary | ICD-10-CM | POA: Diagnosis not present

## 2015-05-09 DIAGNOSIS — M150 Primary generalized (osteo)arthritis: Secondary | ICD-10-CM

## 2015-05-09 DIAGNOSIS — M159 Polyosteoarthritis, unspecified: Secondary | ICD-10-CM

## 2015-05-09 LAB — COMPREHENSIVE METABOLIC PANEL
ALT: 14 U/L (ref 6–29)
AST: 23 U/L (ref 10–35)
Albumin: 4.1 g/dL (ref 3.6–5.1)
Alkaline Phosphatase: 74 U/L (ref 33–130)
BUN: 20 mg/dL (ref 7–25)
CHLORIDE: 104 mmol/L (ref 98–110)
CO2: 24 mmol/L (ref 20–31)
CREATININE: 1.02 mg/dL — AB (ref 0.60–0.88)
Calcium: 10.2 mg/dL (ref 8.6–10.4)
GLUCOSE: 100 mg/dL — AB (ref 70–99)
POTASSIUM: 3.6 mmol/L (ref 3.5–5.3)
SODIUM: 140 mmol/L (ref 135–146)
TOTAL PROTEIN: 6.6 g/dL (ref 6.1–8.1)
Total Bilirubin: 0.4 mg/dL (ref 0.2–1.2)

## 2015-05-09 LAB — CBC WITH DIFFERENTIAL/PLATELET
BASOS ABS: 0 10*3/uL (ref 0.0–0.1)
BASOS PCT: 0 % (ref 0–1)
EOS ABS: 0.1 10*3/uL (ref 0.0–0.7)
EOS PCT: 1 % (ref 0–5)
HCT: 37.1 % (ref 36.0–46.0)
Hemoglobin: 11.9 g/dL — ABNORMAL LOW (ref 12.0–15.0)
Lymphocytes Relative: 26 % (ref 12–46)
Lymphs Abs: 1.9 10*3/uL (ref 0.7–4.0)
MCH: 31.6 pg (ref 26.0–34.0)
MCHC: 32.1 g/dL (ref 30.0–36.0)
MCV: 98.4 fL (ref 78.0–100.0)
MPV: 9.3 fL (ref 8.6–12.4)
Monocytes Absolute: 0.6 10*3/uL (ref 0.1–1.0)
Monocytes Relative: 8 % (ref 3–12)
NEUTROS PCT: 65 % (ref 43–77)
Neutro Abs: 4.7 10*3/uL (ref 1.7–7.7)
PLATELETS: 225 10*3/uL (ref 150–400)
RBC: 3.77 MIL/uL — ABNORMAL LOW (ref 3.87–5.11)
RDW: 14.6 % (ref 11.5–15.5)
WBC: 7.2 10*3/uL (ref 4.0–10.5)

## 2015-05-09 LAB — CK: CK TOTAL: 162 U/L (ref 7–177)

## 2015-05-18 ENCOUNTER — Telehealth: Payer: Self-pay | Admitting: *Deleted

## 2015-05-18 ENCOUNTER — Ambulatory Visit: Payer: Medicare Other | Admitting: Family Medicine

## 2015-05-18 NOTE — Telephone Encounter (Signed)
Received call from [place], PT with [facility]   Requested to extend PT for 1 visit x1 week, then 3x weekly for 2 weeks for gait, balance, and pain management.  Verbal order given.

## 2015-05-21 ENCOUNTER — Ambulatory Visit (HOSPITAL_COMMUNITY): Payer: Medicare Other

## 2015-05-23 ENCOUNTER — Ambulatory Visit (HOSPITAL_COMMUNITY)
Admission: RE | Admit: 2015-05-23 | Discharge: 2015-05-23 | Disposition: A | Discharge status: Home / Self Care | Payer: Medicare Other | Source: Ambulatory Visit | Attending: Family Medicine | Admitting: Family Medicine

## 2015-05-23 ENCOUNTER — Ambulatory Visit (HOSPITAL_COMMUNITY): Payer: Medicare Other

## 2015-05-23 DIAGNOSIS — Z1231 Encounter for screening mammogram for malignant neoplasm of breast: Secondary | ICD-10-CM

## 2015-05-28 ENCOUNTER — Other Ambulatory Visit: Payer: Self-pay | Admitting: Family Medicine

## 2015-05-28 NOTE — Telephone Encounter (Signed)
Medication refilled per protocol. 

## 2015-06-18 ENCOUNTER — Telehealth: Payer: Self-pay | Admitting: Family Medicine

## 2015-06-18 MED ORDER — AMLODIPINE BESYLATE 5 MG PO TABS: 5.0000 mg | ORAL_TABLET | Freq: Every morning | ORAL | Status: DC

## 2015-06-18 NOTE — Addendum Note (Signed)
Addended by: Sheral Flow on: 06/18/2015 03:30 PM   Modules accepted: Orders

## 2015-06-18 NOTE — Telephone Encounter (Signed)
Call placed to patient and patient made aware.   Prescription sent to pharmacy.  

## 2015-06-18 NOTE — Telephone Encounter (Signed)
Call pt - her BP has been very high with PT I want her to increase norvasc to 5mg , she can take 2 of the 2.5mg  or send in new script if she is out    BP has been ranging 150-180/ 80-100

## 2015-08-06 ENCOUNTER — Encounter: Payer: Self-pay | Admitting: Family Medicine

## 2015-08-06 ENCOUNTER — Ambulatory Visit (INDEPENDENT_AMBULATORY_CARE_PROVIDER_SITE_OTHER): Payer: Medicare Other | Admitting: Family Medicine

## 2015-08-06 VITALS — BP 130/74 | HR 72 | Temp 98.1°F | Resp 16 | Ht 62.0 in | Wt 178.0 lb

## 2015-08-06 DIAGNOSIS — R350 Frequency of micturition: Secondary | ICD-10-CM | POA: Diagnosis not present

## 2015-08-06 DIAGNOSIS — M170 Bilateral primary osteoarthritis of knee: Secondary | ICD-10-CM | POA: Diagnosis not present

## 2015-08-06 LAB — URINALYSIS, ROUTINE W REFLEX MICROSCOPIC
Bilirubin Urine: NEGATIVE
Glucose, UA: NEGATIVE
Hgb urine dipstick: NEGATIVE
Ketones, ur: NEGATIVE
LEUKOCYTES UA: NEGATIVE
NITRITE: NEGATIVE
PH: 6.5 (ref 5.0–8.0)
Protein, ur: NEGATIVE
SPECIFIC GRAVITY, URINE: 1.015 (ref 1.001–1.035)

## 2015-08-06 NOTE — Patient Instructions (Signed)
We will call with urine culture results Try to stop drinking 1 hour before bedtime Drink decaffeinated tea Walk around during the day  Referral to Dr. Aline Brochure Change F/U to Feb

## 2015-08-06 NOTE — Progress Notes (Signed)
Patient ID: Dawn Church, female   DOB: [birth year], 81 y.o.   MRN: [id number]   Subjective:    Patient ID: Dawn Church, female    DOB: 12/26/1927, 81 y.o.   MRN: [id number]  Patient presents for frequent Urination  patient here frequent urination worse at nighttime. She had a mild odor to her urine. She denies any burning with urination no change in bowels. She gets constipation and takes me relaxants as needed. She would also like a referral to orthopedics for her osteoarthritis of her knee. I put a steroid shot in her knee a few months back she improved for a few weeks but the pain has returned. She wants to know if there are any other options. Daughter tells me that she drinks a significant amount of water before bedtime and she thinks this is why she is urinating so much.    Review Of Systems:per above  GEN- + fatigue, fever, weight loss,weakness, recent illness HEENT- denies eye drainage, change in vision, nasal discharge, CVS- denies chest pain, palpitations RESP- denies SOB, cough, wheeze ABD- denies N/V, change in stools, abd pain GU- denies dysuria, hematuria, dribbling, incontinence MSK- +joint pain, muscle aches, injury Neuro- denies headache, dizziness, syncope, seizure activity       Objective:    BP 130/74 mmHg  Pulse 72  Temp(Src) 98.1 F (36.7 C) (Oral)  Resp 16  Ht 5\' 2"  (1.575 m)  Wt 178 lb (80.74 kg)  BMI 32.55 kg/m2 GEN- NAD, alert and oriented x3 HEENT- PERRL, EOMI, non injected sclera, pink conjunctiva, MMM, oropharynx clear CVS- RRR, no murmur RESP-CTAB ABD-NABS,soft,NT,ND, no CVA tenderness  EXT- No edema Pulses- Radial  2+        Assessment & Plan:      Problem List Items Addressed This Visit    None    Visit Diagnoses    Frequent urination    -  Primary    Send urine for culture, in general at baseline, exam benign,advised to cut off water/Tea at bedtime    Relevant Orders    Urinalysis, Routine w reflex microscopic (not at [facility])  (Completed)    Urine culture    Primary osteoarthritis of both knees        Relevant Orders    Ambulatory referral to Orthopedic Surgery       Note: This dictation was prepared with Dragon dictation along with smaller phrase technology. Any transcriptional errors that result from this process are unintentional.

## 2015-08-07 LAB — URINE CULTURE
COLONY COUNT: NO GROWTH
Organism ID, Bacteria: NO GROWTH

## 2015-08-28 ENCOUNTER — Ambulatory Visit (INDEPENDENT_AMBULATORY_CARE_PROVIDER_SITE_OTHER): Payer: Medicare Other

## 2015-08-28 ENCOUNTER — Ambulatory Visit: Payer: Medicare Other

## 2015-08-28 ENCOUNTER — Ambulatory Visit (INDEPENDENT_AMBULATORY_CARE_PROVIDER_SITE_OTHER): Payer: Medicare Other | Admitting: Orthopedic Surgery

## 2015-08-28 VITALS — BP 151/81 | Ht 62.0 in | Wt 178.0 lb

## 2015-08-28 DIAGNOSIS — M5441 Lumbago with sciatica, right side: Secondary | ICD-10-CM | POA: Diagnosis not present

## 2015-08-28 DIAGNOSIS — M25562 Pain in left knee: Secondary | ICD-10-CM | POA: Diagnosis not present

## 2015-08-28 DIAGNOSIS — M25561 Pain in right knee: Secondary | ICD-10-CM

## 2015-08-28 MED ORDER — METHOCARBAMOL 500 MG PO TABS: 500.0000 mg | ORAL_TABLET | Freq: Four times a day (QID) | ORAL | Status: DC | PRN

## 2015-08-28 NOTE — Progress Notes (Signed)
Patient ID: Dawn Church, female   DOB: 1928/08/24, 81 y.o.   MRN: [id number]  Chief Complaint  Patient presents with  . Knee Pain    bilateral knee pain Rt>Lt    HPI Dawn Church is a 81 y.o. female.  Presents for evaluation of knee and hip pain  New patient to me last seen more than 3 years ago  Complains of right hip and knee pain for 4 weeks unclear pain is radiating from the hip to the knee or need to the hip. She describes a dull aching sensation on and around the right ischium radiating into the right knee with an aching sensation. She reports the pain is mild to moderate worse with sitting. She did not note anything that makes it better. She listed her review of systems is negative but I imagine that she is having other difficulties.  Review of Systems Review of Systems  All other systems reviewed and are negative.   Past Medical History  Diagnosis Date  . TIA (transient ischemic attack)   . Epigastric pain   . Stomach ulcer   . Hemochromatosis   . Anemia   . CVA (cerebral infarction)   . Hypercholesteremia   . Hypertension   . Hx of seasonal allergies   . Stroke [facility]) 2015    Past Surgical History  Procedure Laterality Date  . Vaginal hysterectomy    . Appendectomy    . Esophagogastroduodenoscopy  12/20/2010  . Breast biopsy      Bilateral  . Breast biopsy      bilateral  . Shoulder open rotator cuff repair      right   . Cataract extraction, bilateral      Family History  Problem Relation Age of Onset  . Colon cancer Neg Hx   . Diabetes    . Cancer      Social History Social History  Substance Use Topics  . Smoking status: Never Smoker   . Smokeless tobacco: Never Used  . Alcohol Use: No    Allergies  Allergen Reactions  . Codeine Other (See Comments)    Caused patient to "space out"  . Ultram [Tramadol]     nausea    Current Outpatient Prescriptions  Medication Sig Dispense Refill  . Acetaminophen (APAP ARTHRITIS PO) Take 2  tablets by mouth as needed.    Marland Kitchen amLODipine (NORVASC) 5 MG tablet Take 1 tablet (5 mg total) by mouth every morning. 30 tablet 1  . aspirin 325 MG tablet Take 325 mg by mouth daily.    Marland Kitchen atorvastatin (LIPITOR) 10 MG tablet TAKE 1 TABLET (10 MG TOTAL) BY MOUTH AT BEDTIME. 90 tablet 2  . fluticasone (FLONASE) 50 MCG/ACT nasal spray Place 2 sprays into both nostrils daily. 48 g 3  . Multiple Vitamin (MULTIVITAMIN) tablet Take 1 tablet by mouth daily.      Marland Kitchen omega-3 acid ethyl esters (LOVAZA) 1 G capsule TAKE 1 CAPSULE (1 G TOTAL) BY MOUTH 2 (TWO) TIMES DAILY. 180 capsule 2  . omega-3 acid ethyl esters (LOVAZA) 1 G capsule TAKE 1 CAPSULE (1 G TOTAL) BY MOUTH 2 (TWO) TIMES DAILY. 180 capsule 2  . pantoprazole (PROTONIX) 40 MG tablet TAKE 1 TABLET (40 MG TOTAL) BY MOUTH DAILY BEFORE BREAKFAST. 90 tablet 2  . ramipril (ALTACE) 10 MG capsule Take 1 capsule (10 mg total) by mouth 2 (two) times daily. 180 capsule 2  . triamterene-hydrochlorothiazide (MAXZIDE-25) 37.5-25 MG per tablet TAKE 1 TABLET BY MOUTH  DAILY. 90 tablet 1  . methocarbamol (ROBAXIN) 500 MG tablet Take 1 tablet (500 mg total) by mouth every 6 (six) hours as needed for muscle spasms. 60 tablet 2   No current facility-administered medications for this visit.       Physical Exam BP 151/81 mmHg  Ht 5\' 2"  (1.575 m)  Wt 178 lb (80.74 kg)  BMI 32.55 kg/m2  Physical Exam  Constitutional: She is oriented to person, place, and time. She appears well-developed and well-nourished.  HENT:  Head: Normocephalic.  Eyes: Conjunctivae are normal. Right eye exhibits no discharge. Left eye exhibits no discharge.  Cardiovascular: Intact distal pulses.   Musculoskeletal:  Lumbar spine tenderness decreased range of motion flexion extension and rotation. Increased muscle tension on the right  Patient ambulates with assistive device  Right knee no tenderness no effusion flexion 125 with crepitance and pain in the patellofemoral joint. Knee stable  motor exam normal    Neurological: She is alert and oriented to person, place, and time. She displays normal reflexes. She exhibits normal muscle tone. Coordination normal.  Skin: Skin is warm and dry. Rash noted. She is not diaphoretic. No erythema. No pallor.  Psychiatric: Her behavior is normal. Judgment and thought content normal.    We took films of her back because most of her symptoms seem to be radicular in nature she has severe spondylosis and spondylolisthesis with spondylolisthesis at L4-L5  I injected her right knee and put her on some Robaxin for her back pain follow-up as needed   Procedure note right knee injection verbal consent was obtained to inject right knee joint  Timeout was completed to confirm the site of injection  The medications used were 40 mg of Depo-Medrol and 1% lidocaine 3 cc  Anesthesia was provided by ethyl chloride and the skin was prepped with alcohol.  After cleaning the skin with alcohol a 20-gauge needle was used to inject the right knee joint. There were no complications. A sterile bandage was applied.

## 2015-09-18 ENCOUNTER — Ambulatory Visit: Payer: Medicare Other | Admitting: Family Medicine

## 2015-09-18 ENCOUNTER — Other Ambulatory Visit: Payer: Self-pay | Admitting: Family Medicine

## 2015-09-18 NOTE — Telephone Encounter (Signed)
Medication refilled per protocol. 

## 2015-10-13 ENCOUNTER — Other Ambulatory Visit: Payer: Self-pay | Admitting: Family Medicine

## 2015-10-15 NOTE — Telephone Encounter (Signed)
Medication refilled per protocol. 

## 2015-10-16 ENCOUNTER — Ambulatory Visit (INDEPENDENT_AMBULATORY_CARE_PROVIDER_SITE_OTHER): Payer: Medicare Other | Admitting: Family Medicine

## 2015-10-16 ENCOUNTER — Encounter: Payer: Self-pay | Admitting: Family Medicine

## 2015-10-16 VITALS — BP 142/80 | HR 78 | Temp 98.8°F | Resp 12 | Ht 62.0 in | Wt 179.0 lb

## 2015-10-16 DIAGNOSIS — M8949 Other hypertrophic osteoarthropathy, multiple sites: Secondary | ICD-10-CM

## 2015-10-16 DIAGNOSIS — M159 Polyosteoarthritis, unspecified: Secondary | ICD-10-CM | POA: Diagnosis not present

## 2015-10-16 DIAGNOSIS — E785 Hyperlipidemia, unspecified: Secondary | ICD-10-CM | POA: Diagnosis not present

## 2015-10-16 DIAGNOSIS — M150 Primary generalized (osteo)arthritis: Secondary | ICD-10-CM

## 2015-10-16 DIAGNOSIS — I10 Essential (primary) hypertension: Secondary | ICD-10-CM | POA: Diagnosis not present

## 2015-10-16 LAB — COMPREHENSIVE METABOLIC PANEL
ALBUMIN: 3.8 g/dL (ref 3.6–5.1)
ALT: 17 U/L (ref 6–29)
AST: 24 U/L (ref 10–35)
Alkaline Phosphatase: 75 U/L (ref 33–130)
BILIRUBIN TOTAL: 0.4 mg/dL (ref 0.2–1.2)
BUN: 18 mg/dL (ref 7–25)
CO2: 25 mmol/L (ref 20–31)
CREATININE: 0.98 mg/dL — AB (ref 0.60–0.88)
Calcium: 10.3 mg/dL (ref 8.6–10.4)
Chloride: 105 mmol/L (ref 98–110)
Glucose, Bld: 81 mg/dL (ref 70–99)
Potassium: 4.4 mmol/L (ref 3.5–5.3)
SODIUM: 140 mmol/L (ref 135–146)
TOTAL PROTEIN: 6.6 g/dL (ref 6.1–8.1)

## 2015-10-16 LAB — CBC WITH DIFFERENTIAL/PLATELET
BASOS ABS: 0 10*3/uL (ref 0.0–0.1)
BASOS PCT: 0 % (ref 0–1)
EOS PCT: 1 % (ref 0–5)
Eosinophils Absolute: 0.1 10*3/uL (ref 0.0–0.7)
HCT: 37.5 % (ref 36.0–46.0)
Hemoglobin: 12.4 g/dL (ref 12.0–15.0)
LYMPHS PCT: 30 % (ref 12–46)
Lymphs Abs: 2.3 10*3/uL (ref 0.7–4.0)
MCH: 32.5 pg (ref 26.0–34.0)
MCHC: 33.1 g/dL (ref 30.0–36.0)
MCV: 98.2 fL (ref 78.0–100.0)
MONO ABS: 0.5 10*3/uL (ref 0.1–1.0)
MPV: 8.9 fL (ref 8.6–12.4)
Monocytes Relative: 7 % (ref 3–12)
NEUTROS ABS: 4.7 10*3/uL (ref 1.7–7.7)
Neutrophils Relative %: 62 % (ref 43–77)
PLATELETS: 225 10*3/uL (ref 150–400)
RBC: 3.82 MIL/uL — AB (ref 3.87–5.11)
RDW: 14.3 % (ref 11.5–15.5)
WBC: 7.6 10*3/uL (ref 4.0–10.5)

## 2015-10-16 LAB — LIPID PANEL
CHOLESTEROL: 167 mg/dL (ref 125–200)
HDL: 62 mg/dL (ref >=46)
LDL CALC: 75 mg/dL (ref <130)
TRIGLYCERIDES: 151 mg/dL — AB (ref <150)
Total CHOL/HDL Ratio: 2.7 Ratio (ref <=5.0)
VLDL: 30 mg/dL (ref <30)

## 2015-10-16 NOTE — Assessment & Plan Note (Signed)
Recheck cholesterol level she is on Lipitor as well as omega-3

## 2015-10-16 NOTE — Patient Instructions (Signed)
Continue current medications We will call with lab results  F/U 4 months  

## 2015-10-16 NOTE — Assessment & Plan Note (Signed)
Generalized osteoarthritis. She does not one intervention at her age I would not receive any surgical intervention. She will follow with orthopedics as needed. I've also recommended that she use the Robaxin very sparingly

## 2015-10-16 NOTE — Progress Notes (Signed)
Patient ID: Dawn Church, female   DOB: [birth year], 82 y.o.   MRN: [id number]    Subjective:    Patient ID: Dawn Church, female    DOB: 1927-11-30, 82 y.o.   MRN: [id number]  Patient presents for F/U  patient to follow-up chronic medical promise. She has no new concerns. She was seen my orthopedics had injection done in her knees she has known osteoarthritis and some degenerative changes in her back but she does not one any intervention for that. She was given Robaxin by orthopedics but she is not using this on a regular basis.  She just recently took her medications she has no difficulties with her medicines otherwise. She is getting around at home okay she has not had any falls. She is due for fasting labs today.    Review Of Systems:  GEN- denies fatigue, fever, weight loss,weakness, recent illness HEENT- denies eye drainage, change in vision, nasal discharge, CVS- denies chest pain, palpitations RESP- denies SOB, cough, wheeze ABD- denies N/V, change in stools, abd pain GU- denies dysuria, hematuria, dribbling, incontinence MSK- +joint pain, muscle aches, injury Neuro- denies headache, dizziness, syncope, seizure activity       Objective:    BP 142/80 mmHg  Pulse 78  Temp(Src) 98.8 F (37.1 C) (Rectal)  Resp 12  Ht 5\' 2"  (1.575 m)  Wt 179 lb (81.194 kg)  BMI 32.73 kg/m2 GEN- NAD, alert and oriented x3,walks with cane HEENT- PERRL, EOMI, non injected sclera, pink conjunctiva, MMM, oropharynx clear CVS- RRR, no murmur RESP-CTAB EXT- No edema Pulses- Radial, 2+        Assessment & Plan:      Problem List Items Addressed This Visit    None      Note: This dictation was prepared with Dragon dictation along with smaller phrase technology. Any transcriptional errors that result from this process are unintentional.

## 2015-10-16 NOTE — Assessment & Plan Note (Signed)
Well-controlled change in medication for her age.

## 2015-10-17 ENCOUNTER — Encounter: Payer: Self-pay | Admitting: *Deleted

## 2015-11-05 ENCOUNTER — Other Ambulatory Visit: Payer: Self-pay | Admitting: Family Medicine

## 2015-11-05 NOTE — Telephone Encounter (Signed)
Refill appropriate and filled per protocol. 

## 2015-11-09 ENCOUNTER — Other Ambulatory Visit: Payer: Self-pay | Admitting: Family Medicine

## 2015-11-09 NOTE — Telephone Encounter (Signed)
Medication refilled per protocol. 

## 2015-12-22 ENCOUNTER — Other Ambulatory Visit: Payer: Self-pay | Admitting: Family Medicine

## 2015-12-24 ENCOUNTER — Other Ambulatory Visit: Payer: Self-pay | Admitting: Family Medicine

## 2015-12-24 NOTE — Telephone Encounter (Signed)
Refill appropriate and filled per protocol. 

## 2016-01-10 ENCOUNTER — Telehealth: Payer: Self-pay | Admitting: *Deleted

## 2016-01-10 ENCOUNTER — Other Ambulatory Visit: Payer: Self-pay | Admitting: Family Medicine

## 2016-01-10 NOTE — Telephone Encounter (Signed)
Refill appropriate and filled per protocol. 

## 2016-01-10 NOTE — Telephone Encounter (Signed)
REQUESTING REFILL ON METHOCARBAMOL

## 2016-01-11 ENCOUNTER — Other Ambulatory Visit: Payer: Self-pay | Admitting: Orthopedic Surgery

## 2016-01-11 MED ORDER — METHOCARBAMOL 500 MG PO TABS: 500.0000 mg | ORAL_TABLET | Freq: Four times a day (QID) | ORAL | Status: DC | PRN

## 2016-01-24 ENCOUNTER — Other Ambulatory Visit: Payer: Self-pay | Admitting: Family Medicine

## 2016-01-24 NOTE — Telephone Encounter (Signed)
Refill appropriate and filled per protocol. 

## 2016-02-05 ENCOUNTER — Other Ambulatory Visit: Payer: Self-pay | Admitting: Family Medicine

## 2016-02-12 ENCOUNTER — Encounter: Payer: Self-pay | Admitting: Family Medicine

## 2016-02-12 ENCOUNTER — Ambulatory Visit (INDEPENDENT_AMBULATORY_CARE_PROVIDER_SITE_OTHER): Payer: Medicare Other | Admitting: Family Medicine

## 2016-02-12 VITALS — BP 140/68 | HR 88 | Temp 98.1°F | Resp 16 | Ht 62.0 in | Wt 172.0 lb

## 2016-02-12 DIAGNOSIS — E785 Hyperlipidemia, unspecified: Secondary | ICD-10-CM

## 2016-02-12 DIAGNOSIS — I10 Essential (primary) hypertension: Secondary | ICD-10-CM

## 2016-02-12 DIAGNOSIS — M159 Polyosteoarthritis, unspecified: Secondary | ICD-10-CM | POA: Diagnosis not present

## 2016-02-12 DIAGNOSIS — K5901 Slow transit constipation: Secondary | ICD-10-CM | POA: Diagnosis not present

## 2016-02-12 DIAGNOSIS — K5900 Constipation, unspecified: Secondary | ICD-10-CM | POA: Insufficient documentation

## 2016-02-12 MED ORDER — RAMIPRIL 10 MG PO CAPS: 10.0000 mg | ORAL_CAPSULE | Freq: Two times a day (BID) | ORAL | Status: DC

## 2016-02-12 MED ORDER — TRIAMTERENE-HCTZ 37.5-25 MG PO TABS: 1.0000 | ORAL_TABLET | Freq: Every day | ORAL | Status: DC

## 2016-02-12 MED ORDER — AMLODIPINE BESYLATE 5 MG PO TABS: ORAL_TABLET | ORAL | Status: DC

## 2016-02-12 MED ORDER — LINACLOTIDE 72 MCG PO CAPS: 72.0000 ug | ORAL_CAPSULE | Freq: Every day | ORAL | Status: DC

## 2016-02-12 NOTE — Assessment & Plan Note (Signed)
Blood pressure looks good for her agemedication

## 2016-02-12 NOTE — Assessment & Plan Note (Signed)
Try Linzess 72 mg,samples given

## 2016-02-12 NOTE — Assessment & Plan Note (Signed)
Cholesterol is at goal

## 2016-02-12 NOTE — Progress Notes (Signed)
Patient ID: Dawn Church, female   DOB: [birth year], 82 y.o.   MRN: [id number]    Subjective:    Patient ID: Dawn Church, female    DOB: 1928/08/10, 82 y.o.   MRN: [id number]  Patient presents for 4 month F/U   Pt here to f/u chronic medical problems. HTN- taking meds as prescribed, no side effects    Recent fasting labs in Feb, showed normal LDL at 75   renal function at baseline- well preserved for age   30 with  Problems with constipation. Her daughter notices when she uses some over-the-counter agents which she is using too much of she seems to be more fatigued those days are not herself.. She does not think that the MiraLAX worked very well she's been taking a lot of magnesium containing products Medications reviewed  Her weight is down about 7 pounds since February she states that he's been eating smaller amounts throughout the day but that she feels better at this weight. She states that she does have a good appetite  OA, uses robaxin as needed, tylenol for arthritis pain    Review Of Systems:  GEN- denies fatigue, fever, weight loss,weakness, recent illness HEENT- denies eye drainage, change in vision, nasal discharge, CVS- denies chest pain, palpitations RESP- denies SOB, cough, wheeze ABD- denies N/V, +change in stools, abd pain GU- denies dysuria, hematuria, dribbling, incontinence MSK- + joint pain, muscle aches, injury Neuro- denies headache, dizziness, syncope, seizure activity       Objective:    BP 140/68 mmHg  Pulse 88  Temp(Src) 98.1 F (36.7 C) (Oral)  Resp 16  Ht 5\' 2"  (1.575 m)  Wt 172 lb (78.019 kg)  BMI 31.45 kg/m2 GEN- NAD, alert and oriented x3,well  Appearing ,weight down  7lbs HEENT- PERRL, EOMI, non injected sclera, pink conjunctiva, MMM, oropharynx clear CVS- RRR, no murmur RESP-CTAB ABD-NABS,soft,NT,ND EXT- No edema Pulses- Radial, DP- 2+        Assessment & Plan:      Problem List Items Addressed This Visit    Hyperlipidemia    Cholesterol is at goal      Relevant Medications   ramipril (ALTACE) 10 MG capsule   amLODipine (NORVASC) 5 MG tablet   triamterene-hydrochlorothiazide (MAXZIDE-25) 37.5-25 MG tablet   Generalized OA   Essential hypertension, benign - Primary    Blood pressure looks good for her agemedication      Relevant Medications   ramipril (ALTACE) 10 MG capsule   amLODipine (NORVASC) 5 MG tablet   triamterene-hydrochlorothiazide (MAXZIDE-25) 37.5-25 MG tablet   Constipation    Try Linzess 72 mg,samples given          Note: This dictation was prepared with Dragon dictation along with smaller phrase technology. Any transcriptional errors that result from this process are unintentional.

## 2016-02-12 NOTE — Patient Instructions (Addendum)
F/U Nov  for wellness exam  Try the linzess  Call if this works

## 2016-02-19 ENCOUNTER — Ambulatory Visit: Payer: Medicare Other | Admitting: Family Medicine

## 2016-02-25 ENCOUNTER — Telehealth: Payer: Self-pay | Admitting: Family Medicine

## 2016-02-25 MED ORDER — LINACLOTIDE 72 MCG PO CAPS: 72.0000 ug | ORAL_CAPSULE | Freq: Every day | ORAL | Status: DC

## 2016-02-25 NOTE — Telephone Encounter (Signed)
Prescription sent to pharmacy.

## 2016-02-25 NOTE — Telephone Encounter (Signed)
Dr. Buelah Manis had given pt samples of Linzzess to see if it would help her constipation. It did help her and she would like for a prescription to be [facility] to [facility] [place] as soon as possible because she has not had it for 3 days.

## 2016-03-26 ENCOUNTER — Other Ambulatory Visit: Payer: Self-pay | Admitting: Family Medicine

## 2016-03-26 NOTE — Telephone Encounter (Signed)
Refill appropriate and filled per protocol. 

## 2016-04-09 IMAGING — MR MR HEAD W/O CM
6 of 11 series · 19 of 48 positions shown · non-contrast
Comparison: MRI 03/14/2009

CLINICAL DATA: Cerebral infarction. Left hemi paresis. Episode 3
weeks ago.

EXAM:
MRI HEAD WITHOUT CONTRAST
MRA HEAD WITHOUT CONTRAST
TECHNIQUE: Multiplanar, multiecho pulse sequences of the brain and surrounding
structures were obtained without intravenous contrast. Angiographic
images of the head were obtained using MRA technique without
contrast.

[Series 2: MRA · axial · 0.6mm · 0.33mm/px · z∈[-35,-7]mm · 3 of 200 slices shown]
[im 12/200]
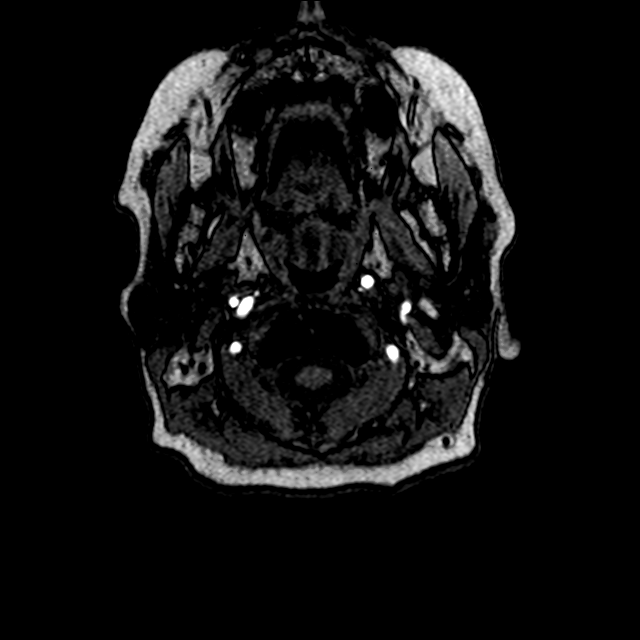
[im 36/200]
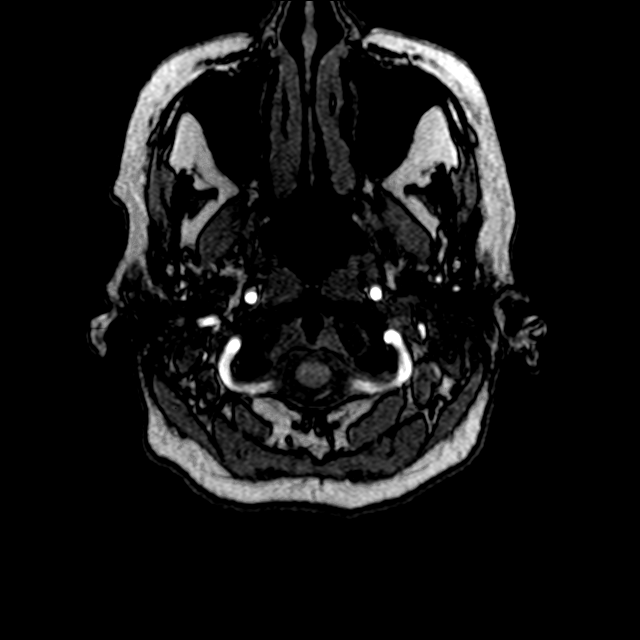
[im 59/200]
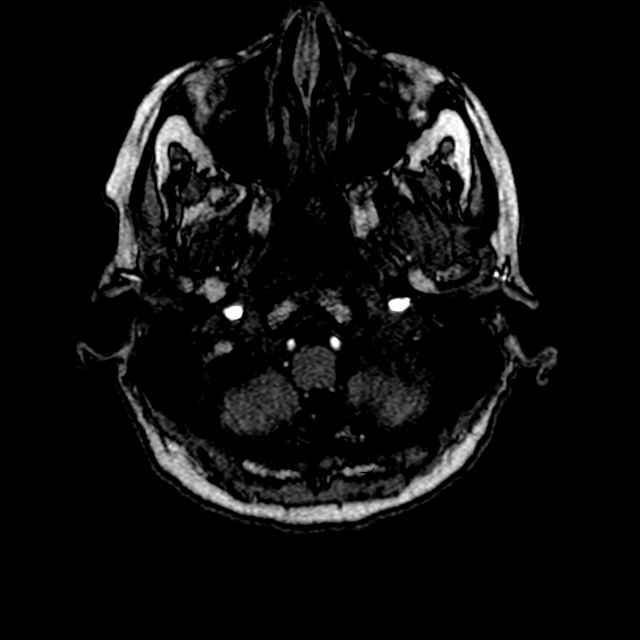

[Series 7: T1 · sagittal · 5.0mm · 0.43mm/px · 2 of 21 slices shown (1 of 2)]
[im 1/21]
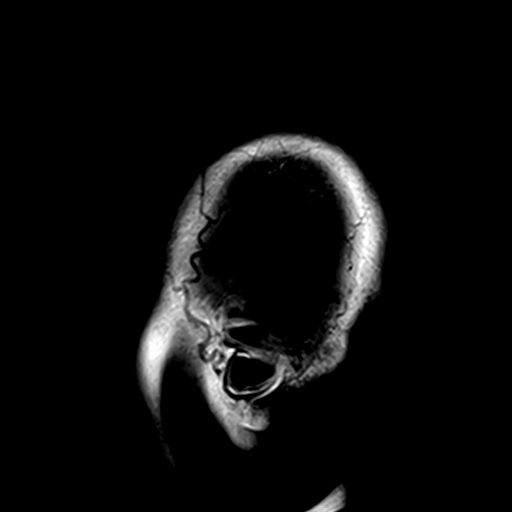
[im 21/21]
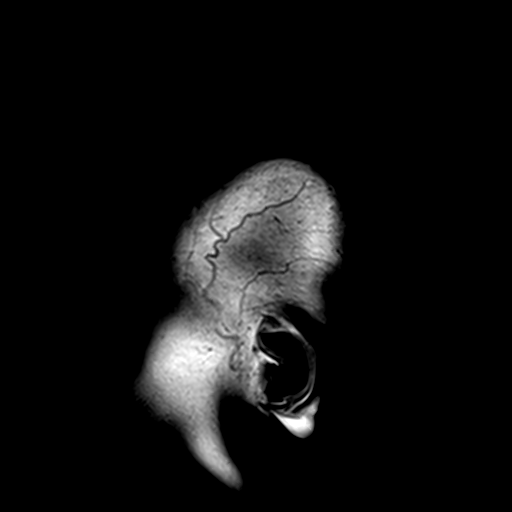

[Series 11: T2 · axial · 5.0mm · 0.47mm/px · z∈[-38,+105]mm · 2 of 23 slices shown (1 of 2)]
[im 1/23]
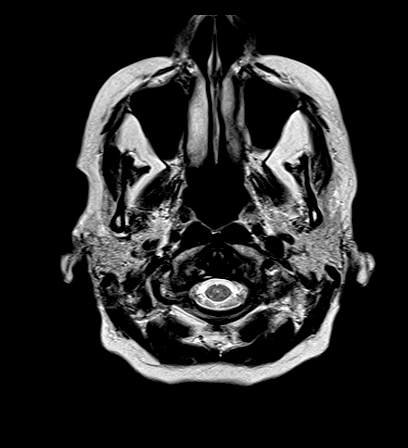
[im 23/23]
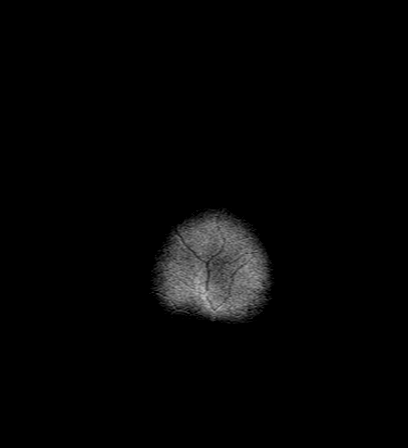

[Series 12: FLAIR · axial · 5.0mm · 0.33mm/px · z∈[-38,+105]mm · 2 of 23 slices shown]
[im 1/23]
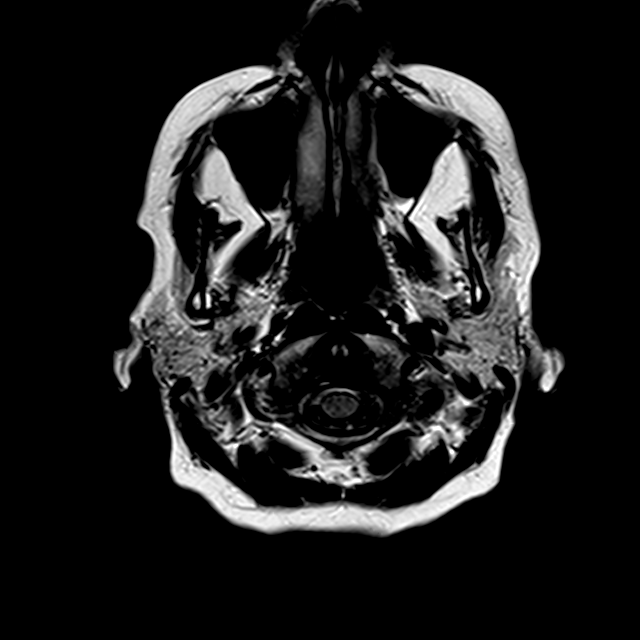
[im 23/23]
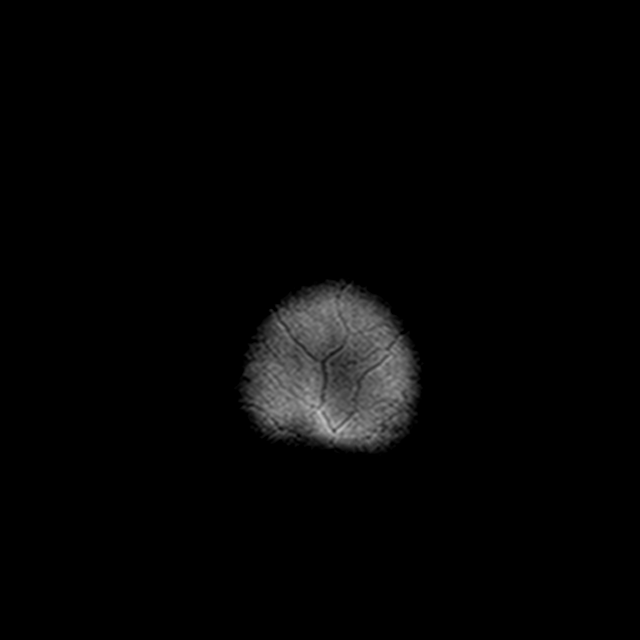

[Series 13: T1 · axial · 2.0mm · 0.41mm/px · z∈[-54,+110]mm · 8 of 83 slices shown (2 of 2)]
[im 1/83]
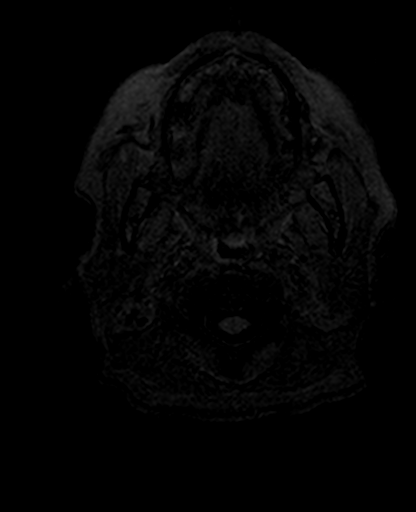
[im 12/83]
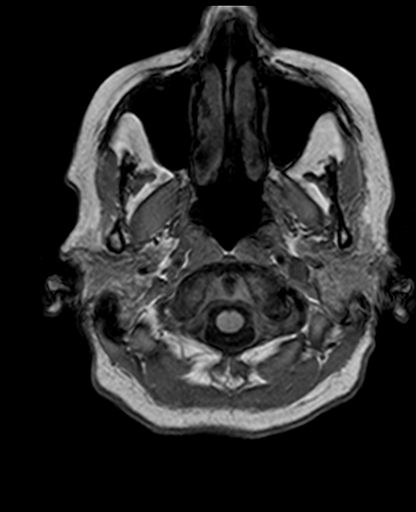
[im 24/83]
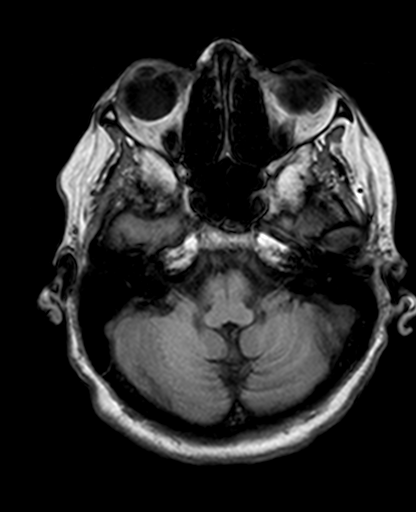
[im 36/83]
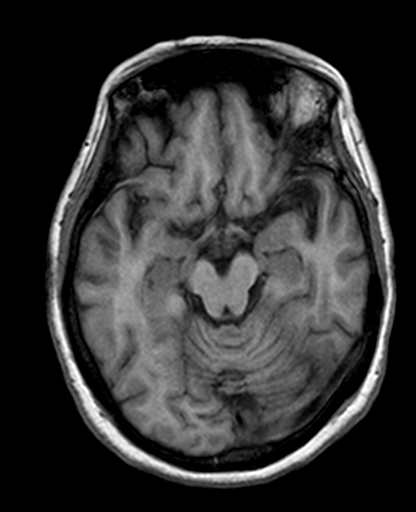
[im 47/83]
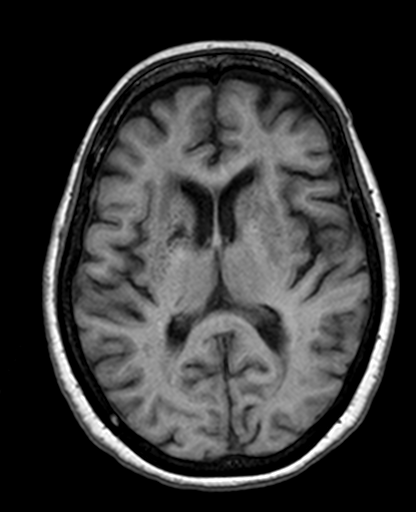
[im 59/83]
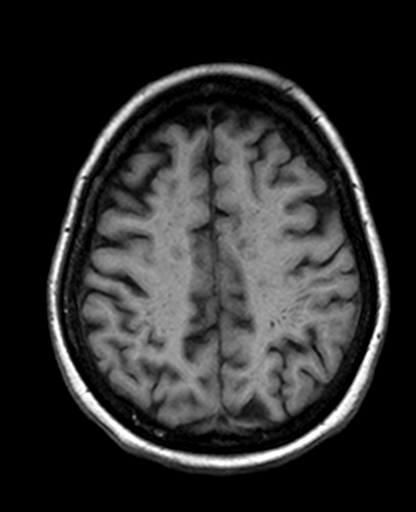
[im 71/83]
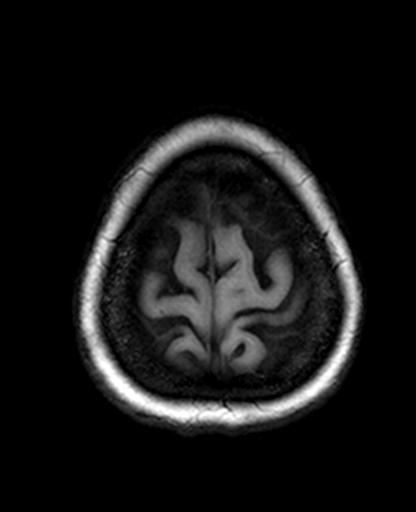
[im 83/83]
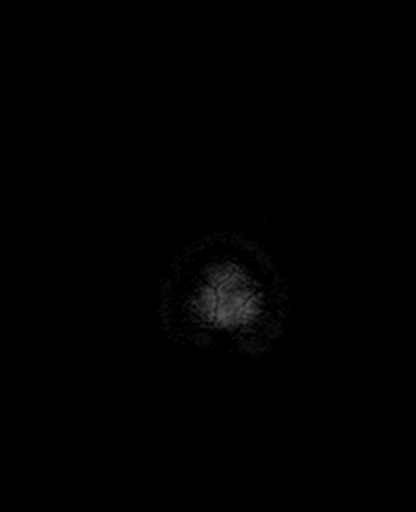

[Series 15: T2 · coronal · 5.0mm · 0.40mm/px · 2 of 24 slices shown (2 of 2)]
[im 1/24]
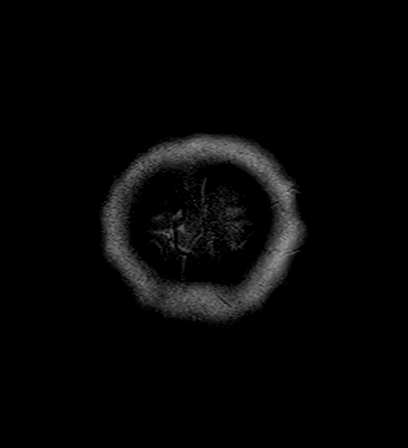
[im 24/24]
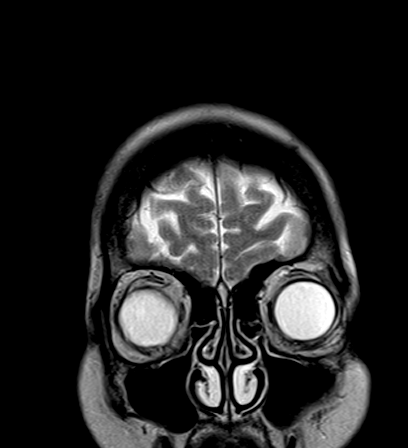

[19 of 48 positions shown; findings below may reference images not displayed]

FINDINGS: MRI HEAD FINDINGS

Small focal area of restricted diffusion in the right lateral basal
ganglia in the region of the external capsule adjacent to a chronic
lacunar infarction.

Chronic ischemic changes have progressed since 3343. Extensive
chronic ischemia in the right basal ganglia with progression.
Chronic microvascular ischemia throughout the cerebral white matter
bilaterally. Chronic microvascular ischemic change in the pons
bilaterally. No cortical infarction identified.

Negative for hemorrhage or mass lesion.

MRA HEAD FINDINGS

Both vertebral arteries are patent to the basilar. PICA patent
bilaterally. Basilar widely patent. Superior cerebellar and
posterior cerebral arteries are patent. Severe stenosis in the mid
and distal right posterior cerebral artery. Mild to moderate disease
in the left posterior cerebral artery.

Atherosclerotic disease of the cavernous carotid with mild stenosis
bilaterally.

Right anterior cerebral artery shows diffuse disease in the A2 and
A3 segments. Right M1 segment shows mild disease. Severe focal
stenosis in the temporal branch of the right middle cerebral artery.

Severe focal stenosis of the left anterior cerebral artery at the
junction of the A1 and A2 segments. Moderate disease left A2 and A3
segments. Mild disease left M1 segment. Severe disease in the left
middle cerebral artery branches
IMPRESSION: 5 mm area of acute/subacute infarct right external capsule.

Chronic microvascular ischemic disease of a moderate degree, with
progression in the right basal ganglia since 3343.

Severe intracranial atherosclerotic disease with progression since

## 2016-04-11 ENCOUNTER — Ambulatory Visit (HOSPITAL_COMMUNITY): Payer: Medicare Other

## 2016-04-11 ENCOUNTER — Other Ambulatory Visit: Payer: Self-pay | Admitting: Family Medicine

## 2016-04-11 DIAGNOSIS — Z1231 Encounter for screening mammogram for malignant neoplasm of breast: Secondary | ICD-10-CM

## 2016-04-23 ENCOUNTER — Encounter: Payer: Self-pay | Admitting: Family Medicine

## 2016-04-23 ENCOUNTER — Ambulatory Visit (INDEPENDENT_AMBULATORY_CARE_PROVIDER_SITE_OTHER): Payer: Medicare Other | Admitting: Family Medicine

## 2016-04-23 VITALS — BP 136/80 | HR 70 | Temp 98.9°F | Resp 14 | Ht 62.0 in | Wt 173.0 lb

## 2016-04-23 DIAGNOSIS — E785 Hyperlipidemia, unspecified: Secondary | ICD-10-CM

## 2016-04-23 DIAGNOSIS — I10 Essential (primary) hypertension: Secondary | ICD-10-CM | POA: Diagnosis not present

## 2016-04-23 DIAGNOSIS — M159 Polyosteoarthritis, unspecified: Secondary | ICD-10-CM

## 2016-04-23 LAB — COMPREHENSIVE METABOLIC PANEL
ALT: 12 U/L (ref 6–29)
AST: 17 U/L (ref 10–35)
Albumin: 4 g/dL (ref 3.6–5.1)
Alkaline Phosphatase: 68 U/L (ref 33–130)
BUN: 14 mg/dL (ref 7–25)
CALCIUM: 10.1 mg/dL (ref 8.6–10.4)
CO2: 25 mmol/L (ref 20–31)
Chloride: 106 mmol/L (ref 98–110)
Creat: 0.88 mg/dL (ref 0.60–0.88)
GLUCOSE: 96 mg/dL (ref 70–99)
POTASSIUM: 4.4 mmol/L (ref 3.5–5.3)
Sodium: 141 mmol/L (ref 135–146)
Total Bilirubin: 0.3 mg/dL (ref 0.2–1.2)
Total Protein: 6.6 g/dL (ref 6.1–8.1)

## 2016-04-23 LAB — CBC WITH DIFFERENTIAL/PLATELET
BASOS ABS: 0 {cells}/uL (ref 0–200)
Basophils Relative: 0 %
EOS ABS: 69 {cells}/uL (ref 15–500)
Eosinophils Relative: 1 %
HEMATOCRIT: 37.3 % (ref 35.0–45.0)
Hemoglobin: 12.1 g/dL (ref 12.0–15.0)
LYMPHS PCT: 23 %
Lymphs Abs: 1587 cells/uL (ref 850–3900)
MCH: 31.7 pg (ref 27.0–33.0)
MCHC: 32.4 g/dL (ref 32.0–36.0)
MCV: 97.6 fL (ref 80.0–100.0)
MONOS PCT: 6 %
MPV: 9 fL (ref 7.5–12.5)
Monocytes Absolute: 414 cells/uL (ref 200–950)
Neutro Abs: 4830 cells/uL (ref 1500–7800)
Neutrophils Relative %: 70 %
PLATELETS: 237 10*3/uL (ref 140–400)
RBC: 3.82 MIL/uL (ref 3.80–5.10)
RDW: 14.5 % (ref 11.0–15.0)
WBC: 6.9 10*3/uL (ref 3.8–10.8)

## 2016-04-23 LAB — LIPID PANEL
CHOL/HDL RATIO: 2.7 ratio (ref <=5.0)
Cholesterol: 175 mg/dL (ref 125–200)
HDL: 64 mg/dL (ref >=46)
LDL CALC: 80 mg/dL (ref <130)
Triglycerides: 156 mg/dL — ABNORMAL HIGH (ref <150)
VLDL: 31 mg/dL — ABNORMAL HIGH (ref <30)

## 2016-04-23 NOTE — Progress Notes (Signed)
   Subjective:    Patient ID: Dawn Church, female    DOB: 24-Dec-1927, 82 y.o.   MRN: [id number]  Patient presents for Follow-up (is fasting)     Patient here for interim visit. She was seen by her [place] health care nurse who told her that she needs to get her blood flow checks and she complained of her feet being cold all the time. She also states occasionally she gets some swelling in her ankles but it always goes down. She is taking her Maxzide and her other blood pressure medicines as prescribed. She continues to get chronic joint pain but does not want any intervention. Honestly she told me then in the visit she did not want any testing done and she did not want any shots which I was already aware of.      Review Of Systems:  GEN- denies fatigue, fever, weight loss,weakness, recent illness HEENT- denies eye drainage, change in vision, nasal discharge, CVS- denies chest pain, palpitations RESP- denies SOB, cough, wheeze ABD- denies N/V, change in stools, abd pain GU- denies dysuria, hematuria, dribbling, incontinence MSK- denies joint pain, muscle aches, injury Neuro- denies headache, dizziness, syncope, seizure activity       Objective:    BP 136/80 (BP Location: Left Arm, Patient Position: Sitting, Cuff Size: Large)   Pulse 70   Temp 98.9 F (37.2 C) (Oral)   Resp 14   Ht 5\' 2"  (1.575 m)   Wt 173 lb (78.5 kg)   BMI 31.64 kg/m  GEN- NAD, alert and oriented x3 HEENT- PERRL, EOMI, non injected sclera, pink conjunctiva, MMM, oropharynx clear CVS- RRR, no murmur RESP-CTAB EXT- No edema,feet warm to touch  Pulses- Radial, DP- 2+, decrease in PT bilat ,cap refill normal        Assessment & Plan:      Problem List Items Addressed This Visit    None    Visit Diagnoses   None.     Note: This dictation was prepared with Dragon dictation along with smaller phrase technology. Any transcriptional errors that result from this process are unintentional.

## 2016-04-23 NOTE — Assessment & Plan Note (Signed)
Well controlled I do not see any signs of any significant peripheral vascular disease. With her age and comorbidities she may deathly have some that she does not want any intervention or any further workup and I think this is very reasonable. She'll continue her current blood pressure medications I see no sign of any fluid overload. She is fasting we will go ahead and get her fasting labs. Again she declines immunizations.  He also has known osteoarthritis with [place] is generalized and also affects her legs and her knees or hips she is not wanting intervention for that steroid injections have not helped

## 2016-04-23 NOTE — Patient Instructions (Signed)
F/U in Nov as previous We will call with lab results

## 2016-04-25 ENCOUNTER — Telehealth: Payer: Self-pay | Admitting: Family Medicine

## 2016-04-25 ENCOUNTER — Encounter: Payer: Self-pay | Admitting: *Deleted

## 2016-04-25 DIAGNOSIS — I739 Peripheral vascular disease, unspecified: Secondary | ICD-10-CM

## 2016-04-25 NOTE — Telephone Encounter (Signed)
Wants to see Podiatrist.  OK ?

## 2016-04-25 NOTE — Telephone Encounter (Signed)
Yes, place referral for her toenails

## 2016-04-28 NOTE — Telephone Encounter (Signed)
Referral placed to [facility], they accept her insurance

## 2016-05-26 ENCOUNTER — Ambulatory Visit (HOSPITAL_COMMUNITY)
Admission: RE | Admit: 2016-05-26 | Discharge: 2016-05-26 | Disposition: A | Discharge status: Home / Self Care | Payer: Medicare Other | Source: Ambulatory Visit | Attending: Family Medicine | Admitting: Family Medicine

## 2016-05-26 DIAGNOSIS — Z1231 Encounter for screening mammogram for malignant neoplasm of breast: Secondary | ICD-10-CM

## 2016-05-28 ENCOUNTER — Other Ambulatory Visit: Payer: Self-pay | Admitting: Family Medicine

## 2016-05-28 DIAGNOSIS — R928 Other abnormal and inconclusive findings on diagnostic imaging of breast: Secondary | ICD-10-CM

## 2016-06-03 ENCOUNTER — Ambulatory Visit (HOSPITAL_COMMUNITY)
Admission: RE | Admit: 2016-06-03 | Discharge: 2016-06-03 | Disposition: A | Discharge status: Home / Self Care | Payer: Medicare Other | Source: Ambulatory Visit | Attending: Family Medicine | Admitting: Family Medicine

## 2016-06-03 ENCOUNTER — Encounter: Payer: Self-pay | Admitting: Family Medicine

## 2016-06-03 ENCOUNTER — Other Ambulatory Visit: Payer: Self-pay | Admitting: Family Medicine

## 2016-06-03 DIAGNOSIS — N6321 Unspecified lump in the left breast, upper outer quadrant: Secondary | ICD-10-CM | POA: Insufficient documentation

## 2016-06-03 DIAGNOSIS — R928 Other abnormal and inconclusive findings on diagnostic imaging of breast: Secondary | ICD-10-CM | POA: Diagnosis present

## 2016-06-03 DIAGNOSIS — N6320 Unspecified lump in the left breast, unspecified quadrant: Secondary | ICD-10-CM

## 2016-06-10 ENCOUNTER — Ambulatory Visit (HOSPITAL_COMMUNITY)
Admission: RE | Admit: 2016-06-10 | Discharge: 2016-06-10 | Disposition: A | Discharge status: Home / Self Care | Payer: Medicare Other | Source: Ambulatory Visit | Attending: Family Medicine | Admitting: Family Medicine

## 2016-06-10 ENCOUNTER — Other Ambulatory Visit: Payer: Self-pay | Admitting: Family Medicine

## 2016-06-10 DIAGNOSIS — N6320 Unspecified lump in the left breast, unspecified quadrant: Secondary | ICD-10-CM

## 2016-06-10 DIAGNOSIS — C50412 Malignant neoplasm of upper-outer quadrant of left female breast: Secondary | ICD-10-CM | POA: Insufficient documentation

## 2016-06-10 DIAGNOSIS — R928 Other abnormal and inconclusive findings on diagnostic imaging of breast: Secondary | ICD-10-CM

## 2016-06-10 MED ORDER — LIDOCAINE HCL (PF) 1 % IJ SOLN
INTRAMUSCULAR | Status: AC
  Filled 2016-06-10: qty 10

## 2016-06-13 ENCOUNTER — Encounter: Payer: Self-pay | Admitting: Family Medicine

## 2016-06-13 ENCOUNTER — Ambulatory Visit (INDEPENDENT_AMBULATORY_CARE_PROVIDER_SITE_OTHER): Payer: Medicare Other | Admitting: Family Medicine

## 2016-06-13 VITALS — BP 138/74 | HR 72 | Temp 98.1°F | Resp 16 | Ht 62.0 in | Wt 172.0 lb

## 2016-06-13 DIAGNOSIS — D0512 Intraductal carcinoma in situ of left breast: Secondary | ICD-10-CM | POA: Diagnosis not present

## 2016-06-13 DIAGNOSIS — Z23 Encounter for immunization: Secondary | ICD-10-CM | POA: Diagnosis not present

## 2016-06-13 NOTE — Patient Instructions (Addendum)
Referral to surgeon F/U Cancel Nov Appt, change to end of Dec

## 2016-06-13 NOTE — Progress Notes (Signed)
   Subjective:    Patient ID: Dawn Church, female    DOB: 04-Apr-1928, 82 y.o.   MRN: [id number]  Patient presents for Follow-up (mammogram results) Patient here to follow-up mammogram results. She had abnormal mammogram showing mass in the left breast biopsy shows invasive and ductal carcinoma in situ the left side. She is here to discuss the next step. Her daughter is history of breast cancer as well. She has not palpated any lumps has not felt bad. After further discussion we do not have information regarding    Review Of Systems:  GEN- denies fatigue, fever, weight loss,weakness, recent illness HEENT- denies eye drainage, change in vision, nasal discharge, CVS- denies chest pain, palpitations RESP- denies SOB, cough, wheeze ABD- denies N/V, change in stools, abd pain GU- denies dysuria, hematuria, dribbling, incontinence MSK- denies joint pain, muscle aches, injury Neuro- denies headache, dizziness, syncope, seizure activity       Objective:    BP 138/74 (BP Location: Right Arm, Patient Position: Sitting, Cuff Size: Normal)   Pulse 72   Temp 98.1 F (36.7 C) (Oral)   Resp 16   Ht 5\' 2"  (1.575 m)   Wt 172 lb (78 kg)   BMI 31.46 kg/m  GEN- NAD, alert and oriented x3 CVS- RRR, no murmur RESP-CTAB Skin- Left breast - biopsy site, D/C/I, no hematoma         Assessment & Plan:      Problem List Items Addressed This Visit    None    Visit Diagnoses    Ductal carcinoma in situ (DCIS) of left breast    -  Primary   After discussion with pt and daughter, she will proceed with surgery referral for lumpectomy and further evaluation. Will then meet with oncology to decide chemo or radiation and the side effects of these   Need for prophylactic vaccination and inoculation against influenza       Relevant Orders   Flu Vaccine QUAD 36+ mos PF IM (Fluarix & Fluzone Quad PF) (Completed)      Note: This dictation was prepared with Dragon dictation along with smaller phrase  technology. Any transcriptional errors that result from this process are unintentional.

## 2016-06-20 ENCOUNTER — Telehealth: Payer: Self-pay | Admitting: *Deleted

## 2016-06-20 NOTE — Telephone Encounter (Signed)
Pt does not want to see Dr Arnoldo Morale.  She wants to go to [facility].

## 2016-06-20 NOTE — Telephone Encounter (Signed)
Received VM from patient.   Reports that she has been contacted by Dr Arnoldo Morale in regards to referral. Reports that she does not want to see Dr. Arnoldo Morale. States that she wants to go with [facility] provider in [place] at the [facility].   Please call back at [phone].

## 2016-06-20 NOTE — Telephone Encounter (Signed)
Spoke to patient and referral rerouted

## 2016-06-22 DIAGNOSIS — C801 Malignant (primary) neoplasm, unspecified: Secondary | ICD-10-CM

## 2016-06-22 HISTORY — DX: Malignant (primary) neoplasm, unspecified: C80.1

## 2016-06-23 ENCOUNTER — Other Ambulatory Visit: Payer: Self-pay | Admitting: Family Medicine

## 2016-06-25 ENCOUNTER — Ambulatory Visit: Payer: Self-pay | Admitting: General Surgery

## 2016-06-25 DIAGNOSIS — C50912 Malignant neoplasm of unspecified site of left female breast: Secondary | ICD-10-CM

## 2016-06-26 ENCOUNTER — Other Ambulatory Visit: Payer: Self-pay | Admitting: General Surgery

## 2016-06-26 DIAGNOSIS — C50912 Malignant neoplasm of unspecified site of left female breast: Secondary | ICD-10-CM

## 2016-06-27 ENCOUNTER — Encounter (HOSPITAL_BASED_OUTPATIENT_CLINIC_OR_DEPARTMENT_OTHER): Payer: Self-pay | Admitting: *Deleted

## 2016-06-30 ENCOUNTER — Telehealth: Payer: Self-pay | Admitting: Hematology and Oncology

## 2016-06-30 ENCOUNTER — Encounter: Payer: Self-pay | Admitting: Hematology and Oncology

## 2016-06-30 ENCOUNTER — Telehealth: Payer: Self-pay | Admitting: Radiation Oncology

## 2016-06-30 NOTE — Telephone Encounter (Signed)
Appt scheduled for 11/13 at 1pm. Spoke to the pt's daughter and gave the appt date and time. Demographics verified. Appt scheduled after the pt has surgery. Surgery date is 11/6.

## 2016-06-30 NOTE — Telephone Encounter (Signed)
Spoke with patient she stated that Dr. Excell Seltzer told her on last Wednesday she would not need radiation. Message sent to Lodema Hong the referral coordinator to see if the appointment is really needed or not.

## 2016-07-02 ENCOUNTER — Ambulatory Visit (HOSPITAL_COMMUNITY)
Admission: RE | Admit: 2016-07-02 | Discharge: 2016-07-02 | Disposition: A | Discharge status: Home / Self Care | Payer: Medicare Other | Source: Ambulatory Visit | Attending: General Surgery | Admitting: General Surgery

## 2016-07-02 ENCOUNTER — Encounter (HOSPITAL_COMMUNITY)
Admission: RE | Admit: 2016-07-02 | Discharge: 2016-07-02 | Disposition: A | Discharge status: Home / Self Care | Payer: Medicare Other | Source: Ambulatory Visit | Attending: General Surgery | Admitting: General Surgery

## 2016-07-02 DIAGNOSIS — I10 Essential (primary) hypertension: Secondary | ICD-10-CM | POA: Insufficient documentation

## 2016-07-02 HISTORY — DX: Gastro-esophageal reflux disease without esophagitis: K21.9

## 2016-07-02 HISTORY — DX: Malignant (primary) neoplasm, unspecified: C80.1

## 2016-07-02 HISTORY — DX: Effusion, unspecified ankle: M25.473

## 2016-07-02 LAB — BASIC METABOLIC PANEL
Anion gap: 9 (ref 5–15)
BUN: 17 mg/dL (ref 6–20)
CO2: 24 mmol/L (ref 22–32)
CREATININE: 0.92 mg/dL (ref 0.44–1.00)
Calcium: 9.8 mg/dL (ref 8.9–10.3)
Chloride: 106 mmol/L (ref 101–111)
GFR calc Af Amer: >60 mL/min (ref >60)
GFR, EST NON AFRICAN AMERICAN: 55 mL/min — AB (ref >60)
GLUCOSE: 168 mg/dL — AB (ref 65–99)
POTASSIUM: 3.2 mmol/L — AB (ref 3.5–5.1)
SODIUM: 139 mmol/L (ref 135–145)

## 2016-07-03 ENCOUNTER — Encounter (HOSPITAL_BASED_OUTPATIENT_CLINIC_OR_DEPARTMENT_OTHER): Payer: Self-pay | Admitting: *Deleted

## 2016-07-03 NOTE — Progress Notes (Signed)
Dr Gifford Shave review 3.2 k+  No further treatment needed

## 2016-07-04 ENCOUNTER — Telehealth (HOSPITAL_BASED_OUTPATIENT_CLINIC_OR_DEPARTMENT_OTHER): Payer: Self-pay | Admitting: *Deleted

## 2016-07-04 ENCOUNTER — Encounter (HOSPITAL_BASED_OUTPATIENT_CLINIC_OR_DEPARTMENT_OTHER): Payer: Self-pay | Admitting: *Deleted

## 2016-07-04 ENCOUNTER — Inpatient Hospital Stay: Admission: RE | Admit: 2016-07-04 | Payer: Medicare Other | Source: Ambulatory Visit

## 2016-07-04 ENCOUNTER — Telehealth: Payer: Self-pay | Admitting: Family Medicine

## 2016-07-04 DIAGNOSIS — Z01818 Encounter for other preprocedural examination: Secondary | ICD-10-CM

## 2016-07-04 DIAGNOSIS — R9431 Abnormal electrocardiogram [ECG] [EKG]: Secondary | ICD-10-CM

## 2016-07-04 NOTE — Progress Notes (Signed)
Abnormal ekd review by Dr Rockwell Alexandria. Dr Therisa Doyne wants pt to see cardiologist for clearance for surgery.   Spoke with pt and her daughter, at pt requested.  Explanation of what will be occurring and why.  Both voiced understanding.  Informed once she has been cleared for surgery by cardiologist to call Dr Excell Seltzer office to be rescheduled for surgery.   Spoke with Maudie Mercury RN at Dr. Brantley Persons office. She will arrange for cardiac visit and will call pt with appointment.    Called breast center and cancelled seed placement.   Left message with Hassan Rowan at [place] to cancell surgery and why , waiting for return call.

## 2016-07-04 NOTE — Telephone Encounter (Signed)
Pt hjas appt at [facility], 07/09/16 and she is aware

## 2016-07-04 NOTE — Telephone Encounter (Signed)
Pt was pending breast cancer surgery on Monday.  Had abnormal EKG on pre-surgery workup.  Want to get cardiac eval ASAP.  Referral placed to [facility].

## 2016-07-04 NOTE — Telephone Encounter (Signed)
noted 

## 2016-07-07 ENCOUNTER — Ambulatory Visit (HOSPITAL_BASED_OUTPATIENT_CLINIC_OR_DEPARTMENT_OTHER): Admit: 2016-07-07 | Payer: Medicare Other | Admitting: General Surgery

## 2016-07-07 ENCOUNTER — Encounter (HOSPITAL_BASED_OUTPATIENT_CLINIC_OR_DEPARTMENT_OTHER): Payer: Self-pay

## 2016-07-07 SURGERY — BREAST LUMPECTOMY WITH RADIOACTIVE SEED LOCALIZATION
Anesthesia: General | Site: Breast | Laterality: Left

## 2016-07-09 ENCOUNTER — Ambulatory Visit (INDEPENDENT_AMBULATORY_CARE_PROVIDER_SITE_OTHER): Payer: Medicare Other | Admitting: Cardiology

## 2016-07-09 ENCOUNTER — Encounter: Payer: Self-pay | Admitting: Cardiology

## 2016-07-09 VITALS — BP 140/82 | HR 73 | Ht 63.5 in | Wt 170.0 lb

## 2016-07-09 DIAGNOSIS — Z01810 Encounter for preprocedural cardiovascular examination: Secondary | ICD-10-CM | POA: Diagnosis not present

## 2016-07-09 DIAGNOSIS — R9431 Abnormal electrocardiogram [ECG] [EKG]: Secondary | ICD-10-CM | POA: Diagnosis not present

## 2016-07-09 NOTE — Patient Instructions (Signed)
Medication Instructions:  The current medical regimen is effective;  continue present plan and medications.  Testing/Procedures: Your physician has requested that you have an echocardiogram. Echocardiography is a painless test that uses sound waves to create images of your heart. It provides your doctor with information about the size and shape of your heart and how well your heart's chambers and valves are working. This procedure takes approximately one hour. There are no restrictions for this procedure.  Follow-Up: Follow up as needed with Dr Skains.  Thank you for choosing [facility]!!     

## 2016-07-09 NOTE — Progress Notes (Signed)
Cardiology Office Note    Date:  07/09/2016   ID:  Dawn Church, Dawn Church 02/04/1928, MRN [id number]  PCP:  Vic Blackbird, MD  Cardiologist:   Candee Furbish, MD     History of Present Illness:  Dawn Church is a 82 y.o. female here for surgical evaluation/precardiac evaluation (breast cancer surgery).  She ended up having an abnormal EKG on presurgery workup for breast cancer surgery. Her QT interval was prolonged and she was demonstrating inferior Q waves as well as poor R-wave progression on 07/02/16 ECG. Her repeat EKG today on 07/09/16 shows no evidence of inferior Q waves and her QT interval was measured normal at 409 ms (although this is quite challenging given her flattened overall QT segment).   Overall, she has not complained of any cardiac symptoms, no chest pain, no shortness of breath, no orthopnea, no syncope, no unexplained palpitations. She did have a TIA in 2010 and she ambulates with a cane to help preserve her balance. She is still quite mobile however and she does not complain of any anginal symptoms with exertional activity. She has never had any prior cardiac illness. She is here with several family members.  Past Medical History:  Diagnosis Date  . Anemia   . Ankle swelling   . Cancer ([place]) 06/22/2016   left breast DCIS  . CVA (cerebral infarction)   . Epigastric pain   . GERD (gastroesophageal reflux disease)   . Hemochromatosis   . Hx of seasonal allergies   . Hypercholesteremia   . Hypertension   . Stomach ulcer   . Stroke ([place]) 2015  . TIA (transient ischemic attack)     Past Surgical History:  Procedure Laterality Date  . APPENDECTOMY    . BREAST BIOPSY     Bilateral  . BREAST BIOPSY     bilateral  . CATARACT EXTRACTION, BILATERAL    . ESOPHAGOGASTRODUODENOSCOPY  12/20/2010  . SHOULDER OPEN ROTATOR CUFF REPAIR     right   . VAGINAL HYSTERECTOMY      Current Medications: Outpatient Medications Prior to Visit  Medication Sig Dispense  Refill  . Acetaminophen (APAP ARTHRITIS PO) Take 2 tablets by mouth as needed.    Marland Kitchen amLODipine (NORVASC) 5 MG tablet TAKE 1 TABLET (5 MG TOTAL) BY MOUTH EVERY MORNING. 90 tablet 2  . aspirin 325 MG tablet Take 325 mg by mouth daily.    Marland Kitchen atorvastatin (LIPITOR) 10 MG tablet TAKE 1 TABLET (10 MG TOTAL) BY MOUTH AT BEDTIME. 90 tablet 2  . fluticasone (FLONASE) 50 MCG/ACT nasal spray Place 2 sprays into both nostrils daily. 48 g 3  . LINZESS 72 MCG capsule TAKE 1 CAPSULE (72 MCG TOTAL) BY MOUTH DAILY BEFORE BREAKFAST. 30 capsule 0  . methocarbamol (ROBAXIN) 500 MG tablet Take 1 tablet (500 mg total) by mouth every 6 (six) hours as needed for muscle spasms. 60 tablet 2  . Multiple Vitamin (MULTIVITAMIN) tablet Take 1 tablet by mouth daily.      Marland Kitchen omega-3 acid ethyl esters (LOVAZA) 1 G capsule TAKE 1 CAPSULE (1 G TOTAL) BY MOUTH 2 (TWO) TIMES DAILY. 180 capsule 2  . pantoprazole (PROTONIX) 40 MG tablet TAKE 1 TABLET (40 MG TOTAL) BY MOUTH DAILY BEFORE BREAKFAST. 90 tablet 2  . ramipril (ALTACE) 10 MG capsule Take 1 capsule (10 mg total) by mouth 2 (two) times daily. 180 capsule 2  . triamterene-hydrochlorothiazide (MAXZIDE-25) 37.5-25 MG tablet TAKE 1 TABLET BY MOUTH EVERY DAY 90 tablet 0  No facility-administered medications prior to visit.      Allergies:   Codeine and Ultram [tramadol]   Social History   Social History  . Marital status: Widowed    Spouse name: N/A  . Number of children: N/A  . Years of education: N/A   Social History Main Topics  . Smoking status: Never Smoker  . Smokeless tobacco: Never Used  . Alcohol use No  . Drug use: No  . Sexual activity: Not Asked   Other Topics Concern  . None   Social History Narrative  . None     Family History:  No early family history of CAD.    ROS:   Please see the history of present illness.    ROS All other systems reviewed and are negative.   PHYSICAL EXAM:   VS:  BP 140/82   Pulse 73   Ht 5' 3.5" (1.613 m)   Wt  170 lb (77.1 kg)   LMP  (LMP Unknown)   BMI 29.64 kg/m    GEN: Well nourished, well developed, in no acute distress  HEENT: normal  Neck: no JVD, carotid bruits, or masses Cardiac: RRR; no murmurs, rubs, or gallops,no edema  Respiratory:  clear to auscultation bilaterally, normal work of breathing GI: soft, nontender, nondistended, + BS MS: no deformity or atrophy  Skin: warm and dry, no rash Neuro:  Alert and Oriented x 3, Strength and sensation are intact Psych: euthymic mood, full affect  Wt Readings from Last 3 Encounters:  07/09/16 170 lb (77.1 kg)  06/27/16 172 lb (78 kg)  06/13/16 172 lb (78 kg)      Studies/Labs Reviewed:   EKG:  EKGs as described above. Personally viewed.  Recent Labs: 04/23/2016: ALT 12; Hemoglobin 12.1; Platelets 237 07/02/2016: BUN 17; Creatinine, Ser 0.92; Potassium 3.2; Sodium 139   Lipid Panel    Component Value Date/Time   CHOL 175 04/23/2016 1156   TRIG 156 (H) 04/23/2016 1156   HDL 64 04/23/2016 1156   CHOLHDL 2.7 04/23/2016 1156   VLDL 31 (H) 04/23/2016 1156   [place] 80 04/23/2016 1156    Additional studies/ records that were reviewed today include:   Echocardiogram from 03/15/2009 in the setting of her TIA:  1. Left ventricle: The cavity size was normal. Wall thickness was  normal. Systolic function was normal. The estimated ejection  fraction was in the range of 55% to 60%. Wall motion was normal;  there were no regional wall motion abnormalities. Doppler  parameters are consistent with abnormal left ventricular  relaxation (grade 1 diastolic dysfunction). 2. Mitral valve: Mild regurgitation. 3. Tricuspid valve: Mild-moderate regurgitation. 4. Pulmonary arteries: PA peak pressure: 14mm Hg (S). Impressions:    ASSESSMENT:    1. Preoperative cardiovascular examination   2. Abnormal EKG   3. Prolonged Q-T interval on ECG      PLAN:  In order of problems listed above:  Abnormal EKG  - Thankfully  today's EKG does not demonstrate any significant Q waves in the inferior leads and her R-wave progression is improved. Her QT segment is quite flattened and fairly challenging to interpret. Prior EKG does demonstrate what looks like a prolonged QT interval and my suggestion is to try to avoid any medicinal agents that could lengthen QT, perhaps Zofran for instance after surgery. She has not had any unexpected syncope or racing palpitations.  - I will check an echocardiogram again to ensure proper structure and function of her heart. Previously this was normal.  -  If echocardiogram is unremarkable, she may proceed with surgery with low overall cardiac risk.  Preoperative cardiovascular risk assessment  - She is not experiencing any anginal symptoms. Even though she ambulates with a cane following her prior stroke, she is able to maintain her activity. We will check an echocardiogram to ensure proper structure and function. If normal EF, I feel comfortable allowing her to proceed.  Prolonged QT syndrome  - Various EKGs have demonstrated QT prolongation. She does have what appears to be a U wave in the early precordial leads. As is stated above, avoid Zofran postoperatively.    Medication Adjustments/Labs and Tests Ordered: Current medicines are reviewed at length with the patient today.  Concerns regarding medicines are outlined above.  Medication changes, Labs and Tests ordered today are listed in the Patient Instructions below. Patient Instructions  Medication Instructions:  The current medical regimen is effective;  continue present plan and medications.  Testing/Procedures: Your physician has requested that you have an echocardiogram. Echocardiography is a painless test that uses sound waves to create images of your heart. It provides your doctor with information about the size and shape of your heart and how well your heart's chambers and valves are working. This procedure takes approximately  one hour. There are no restrictions for this procedure.  Follow-Up: Follow up as needed with Dr Marlou Porch.  Thank you for choosing [facility]!!        Signed, Candee Furbish, MD  07/09/2016 4:49 PM    [address], [place], [place]  [address] Phone: [phone]; Fax: [phone]

## 2016-07-11 NOTE — Addendum Note (Signed)
Addended by: Domenica Reamer R on: 07/11/2016 08:26 AM   Modules accepted: Orders

## 2016-07-14 ENCOUNTER — Encounter: Payer: Medicare Other | Admitting: Family Medicine

## 2016-07-14 ENCOUNTER — Telehealth: Payer: Self-pay | Admitting: Hematology and Oncology

## 2016-07-14 ENCOUNTER — Ambulatory Visit: Payer: Medicare Other | Admitting: Hematology and Oncology

## 2016-07-14 DIAGNOSIS — C50412 Malignant neoplasm of upper-outer quadrant of left female breast: Secondary | ICD-10-CM | POA: Insufficient documentation

## 2016-07-14 NOTE — Assessment & Plan Note (Deleted)
06/03/2016: Left breast biopsy 2:00 position: Grade 2 IDC with DCIS, ER 100%, PR 100%, Ki-67 5%, HER-2 negative ratio 1.05, 1.2 cm irregular mass left breast 2:00 position 8 cm from nipple, T1c N0 stage IA clinical stage  Pathology and radiology counseling:Discussed with the patient, the details of pathology including the type of breast cancer,the clinical staging, the significance of ER, PR and HER-2/neu receptors and the implications for treatment. After reviewing the pathology in detail, we proceeded to discuss the different treatment options between surgery, radiation, chemotherapy, antiestrogen therapies.  Recommendations: 1. Breast conserving surgery followed by 2. Oncotype DX testing to determine if chemotherapy would be of any benefit followed by 3. Adjuvant radiation therapy followed by 4. Adjuvant antiestrogen therapy  Oncotype counseling: I discussed Oncotype DX test. I explained to the patient that this is a 21 gene panel to evaluate patient tumors DNA to calculate recurrence score. This would help determine whether patient has high risk or intermediate risk or low risk breast cancer. She understands that if her tumor was found to be high risk, she would benefit from systemic chemotherapy. If low risk, no need of chemotherapy. If she was found to be intermediate risk, we would need to evaluate the score as well as other risk factors and determine if an abbreviated chemotherapy may be of benefit.  Return to clinic after surgery to discuss final pathology report and then determine if Oncotype DX testing will need to be sent.

## 2016-07-14 NOTE — Telephone Encounter (Signed)
Pt's daughter returned my call about her mother's appt. Her mom was referred to a cardiologist. It was recommended that they halt other appts until she has her u/s at the end of the month. I asked that she call me back to schedule. Transferred to RadOnc to cancel appt as well.

## 2016-07-15 ENCOUNTER — Other Ambulatory Visit: Payer: Self-pay | Admitting: Family Medicine

## 2016-07-22 ENCOUNTER — Encounter: Payer: Medicare Other | Admitting: Family Medicine

## 2016-07-25 ENCOUNTER — Other Ambulatory Visit: Payer: Self-pay | Admitting: Family Medicine

## 2016-07-30 ENCOUNTER — Ambulatory Visit (HOSPITAL_COMMUNITY): Payer: Medicare Other | Attending: Cardiology

## 2016-07-30 ENCOUNTER — Other Ambulatory Visit: Payer: Self-pay

## 2016-07-30 DIAGNOSIS — I351 Nonrheumatic aortic (valve) insufficiency: Secondary | ICD-10-CM | POA: Diagnosis not present

## 2016-07-30 DIAGNOSIS — R9431 Abnormal electrocardiogram [ECG] [EKG]: Secondary | ICD-10-CM | POA: Insufficient documentation

## 2016-07-30 DIAGNOSIS — I422 Other hypertrophic cardiomyopathy: Secondary | ICD-10-CM | POA: Insufficient documentation

## 2016-07-31 ENCOUNTER — Ambulatory Visit: Payer: Medicare Other | Admitting: Radiation Oncology

## 2016-07-31 ENCOUNTER — Ambulatory Visit: Payer: Medicare Other

## 2016-08-01 ENCOUNTER — Other Ambulatory Visit: Payer: Self-pay | Admitting: *Deleted

## 2016-08-06 ENCOUNTER — Encounter: Payer: Self-pay | Admitting: Radiation Oncology

## 2016-08-11 ENCOUNTER — Ambulatory Visit (HOSPITAL_BASED_OUTPATIENT_CLINIC_OR_DEPARTMENT_OTHER): Payer: Medicare Other | Admitting: Hematology and Oncology

## 2016-08-11 ENCOUNTER — Encounter: Payer: Self-pay | Admitting: Hematology and Oncology

## 2016-08-11 ENCOUNTER — Encounter: Payer: Self-pay | Admitting: *Deleted

## 2016-08-11 DIAGNOSIS — Z170 Estrogen receptor positive status [ER+]: Secondary | ICD-10-CM

## 2016-08-11 DIAGNOSIS — C50412 Malignant neoplasm of upper-outer quadrant of left female breast: Secondary | ICD-10-CM | POA: Diagnosis not present

## 2016-08-11 NOTE — Progress Notes (Signed)
[facility] CONSULT NOTE  Patient Care Team: Alycia Rossetti, MD as PCP - General (Family Medicine)  CHIEF COMPLAINTS/PURPOSE OF CONSULTATION:  Newly diagnosed breast cancer  HISTORY OF PRESENTING ILLNESS:  Dawn Church 82 y.o. female is here because of recent diagnosis of left breast cancer. She underwent a mammogram in October that revealed an abnormality in the left breast is an irregular mass at 2:00 position 8 cm from the nipple measuring 1.2 cm. Biopsy of it came back as grade 2 invasive ductal carcinoma with DCIS that was ER/PR positive HER-2 negative with a Ki-67 of 5%. Patient was seen by surgery who recommended lumpectomy. However she has had previous cardiac issues and this has delayed surgery is scheduled for December 17.  I reviewed her records extensively and collaborated the history with the patient.  SUMMARY OF ONCOLOGIC HISTORY:   Breast cancer of upper-outer quadrant of left female breast ([place])   06/10/2016 Initial Diagnosis    Left breast biopsy 2:00 position: Grade 2 IDC with DCIS, ER 100%, PR 100%, Ki-67 5%, HER-2 negative ratio 1.05, 1.2 cm irregular mass left breast 2:00 position 8 cm from nipple, T1c N0 stage IA clinical stage      MEDICAL HISTORY:  Past Medical History:  Diagnosis Date  . Anemia   . Ankle swelling   . Cancer ([place]) 06/22/2016   left breast DCIS  . CVA (cerebral infarction)   . Epigastric pain   . GERD (gastroesophageal reflux disease)   . Hemochromatosis   . Hx of seasonal allergies   . Hypercholesteremia   . Hypertension   . Stomach ulcer   . Stroke ([place]) 2015  . TIA (transient ischemic attack)     SURGICAL HISTORY: Past Surgical History:  Procedure Laterality Date  . APPENDECTOMY    . BREAST BIOPSY     Bilateral  . BREAST BIOPSY     bilateral  . CATARACT EXTRACTION, BILATERAL    . ESOPHAGOGASTRODUODENOSCOPY  12/20/2010  . SHOULDER OPEN ROTATOR CUFF REPAIR     right   . VAGINAL HYSTERECTOMY      SOCIAL  HISTORY: Social History   Social History  . Marital status: Widowed    Spouse name: N/A  . Number of children: N/A  . Years of education: N/A   Occupational History  . Not on file.   Social History Main Topics  . Smoking status: Never Smoker  . Smokeless tobacco: Never Used  . Alcohol use No  . Drug use: No  . Sexual activity: Not on file   Other Topics Concern  . Not on file   Social History Narrative  . No narrative on file    FAMILY HISTORY: Family History  Problem Relation Age of Onset  . Diabetes    . Cancer    . Colon cancer Neg Hx     ALLERGIES:  is allergic to codeine and ultram [tramadol].  MEDICATIONS:  Current Outpatient Prescriptions  Medication Sig Dispense Refill  . Acetaminophen (APAP ARTHRITIS PO) Take 2 tablets by mouth as needed.    Marland Kitchen amLODipine (NORVASC) 5 MG tablet TAKE 1 TABLET (5 MG TOTAL) BY MOUTH EVERY MORNING. 90 tablet 2  . aspirin 325 MG tablet Take 325 mg by mouth daily.    Marland Kitchen atorvastatin (LIPITOR) 10 MG tablet TAKE 1 TABLET (10 MG TOTAL) BY MOUTH AT BEDTIME. 90 tablet 2  . fluticasone (FLONASE) 50 MCG/ACT nasal spray Place 2 sprays into both nostrils daily. 48 g 3  .  LINZESS 72 MCG capsule TAKE 1 CAPSULE (72 MCG TOTAL) BY MOUTH DAILY BEFORE BREAKFAST. 30 capsule 0  . methocarbamol (ROBAXIN) 500 MG tablet Take 1 tablet (500 mg total) by mouth every 6 (six) hours as needed for muscle spasms. 60 tablet 2  . Multiple Vitamin (MULTIVITAMIN) tablet Take 1 tablet by mouth daily.      Marland Kitchen omega-3 acid ethyl esters (LOVAZA) 1 G capsule TAKE 1 CAPSULE (1 G TOTAL) BY MOUTH 2 (TWO) TIMES DAILY. 180 capsule 2  . pantoprazole (PROTONIX) 40 MG tablet TAKE 1 TABLET (40 MG TOTAL) BY MOUTH DAILY BEFORE BREAKFAST. 90 tablet 2  . ramipril (ALTACE) 10 MG capsule Take 1 capsule (10 mg total) by mouth 2 (two) times daily. 180 capsule 2  . triamterene-hydrochlorothiazide (MAXZIDE-25) 37.5-25 MG tablet TAKE 1 TABLET BY MOUTH EVERY DAY 90 tablet 0   No current  facility-administered medications for this visit.     REVIEW OF SYSTEMS:   Constitutional: Denies fevers, chills or abnormal night sweats Eyes: Denies blurriness of vision, double vision or watery eyes Ears, nose, mouth, throat, and face: Denies mucositis or sore throat Respiratory: Denies cough, dyspnea or wheezes Cardiovascular: Denies palpitation, chest discomfort or lower extremity swelling Gastrointestinal:  Denies nausea, heartburn or change in bowel habits Skin: Denies abnormal skin rashes Lymphatics: Denies new lymphadenopathy or easy bruising Neurological:Denies numbness, tingling or new weaknesses Behavioral/Psych: Mood is stable, no new changes  Breast:  Denies any palpable lumps or discharge All other systems were reviewed with the patient and are negative.  PHYSICAL EXAMINATION: ECOG PERFORMANCE STATUS: 1 - Symptomatic but completely ambulatory  There were no vitals filed for this visit. There were no vitals filed for this visit. Blood pressure 176/73, afebrile, pulse rate 80  GENERAL:alert, no distress and comfortable SKIN: skin color, texture, turgor are normal, no rashes or significant lesions EYES: normal, conjunctiva are pink and non-injected, sclera clear OROPHARYNX:no exudate, no erythema and lips, buccal mucosa, and tongue normal  NECK: supple, thyroid normal size, non-tender, without nodularity LYMPH:  no palpable lymphadenopathy in the cervical, axillary or inguinal LUNGS: clear to auscultation and percussion with normal breathing effort HEART: regular rate & rhythm and no murmurs and no lower extremity edema ABDOMEN:abdomen soft, non-tender and normal bowel sounds Musculoskeletal:no cyanosis of digits and no clubbing  PSYCH: alert & oriented x 3 with fluent speech NEURO: no focal motor/sensory deficits BREAST: No palpable nodules in breast. No palpable axillary or supraclavicular lymphadenopathy (exam performed in the presence of a chaperone)   LABORATORY  DATA:  I have reviewed the data as listed Lab Results  Component Value Date   WBC 6.9 04/23/2016   HGB 12.1 04/23/2016   HCT 37.3 04/23/2016   MCV 97.6 04/23/2016   PLT 237 04/23/2016   Lab Results  Component Value Date   NA 139 07/02/2016   K 3.2 (L) 07/02/2016   CL 106 07/02/2016   CO2 24 07/02/2016    RADIOGRAPHIC STUDIES: I have personally reviewed the radiological reports and agreed with the findings in the report.  ASSESSMENT AND PLAN:  Breast cancer of upper-outer quadrant of left female breast ([place]) 06/03/2016: Left breast biopsy 2:00 position: Grade 2 IDC with DCIS, ER 100%, PR 100%, Ki-67 5%, HER-2 negative ratio 1.05, 1.2 cm irregular mass left breast 2:00 position 8 cm from nipple, T1c N0 stage IA clinical stage  Pathology and radiology counseling:Discussed with the patient, the details of pathology including the type of breast cancer,the clinical staging, the significance of  ER, PR and HER-2/neu receptors and the implications for treatment. After reviewing the pathology in detail, we proceeded to discuss the different treatment options between surgery, radiation, antiestrogen therapies.  Recommendations: 1. Breast conserving surgery followed by 2. +/- Adjuvant radiation therapy followed by 4. Adjuvant antiestrogen therapy  Letrozole counseling: We discussed the risks and benefits of anti-estrogen therapy with aromatase inhibitors. These include but not limited to insomnia, hot flashes, mood changes, vaginal dryness, bone density loss, and weight gain. We strongly believe that the benefits far outweigh the risks. Patient understands these risks and consented to starting treatment. Planned treatment duration is 5 years.  Return to clinic in January 3 after meeting with Dr. [name] with radiation oncology  All questions were answered. The patient knows to call the clinic with any problems, questions or concerns.    Rulon Eisenmenger, MD 08/11/16

## 2016-08-11 NOTE — Assessment & Plan Note (Addendum)
06/03/2016: Left breast biopsy 2:00 position: Grade 2 IDC with DCIS, ER 100%, PR 100%, Ki-67 5%, HER-2 negative ratio 1.05, 1.2 cm irregular mass left breast 2:00 position 8 cm from nipple, T1c N0 stage IA clinical stage  Pathology and radiology counseling:Discussed with the patient, the details of pathology including the type of breast cancer,the clinical staging, the significance of ER, PR and HER-2/neu receptors and the implications for treatment. After reviewing the pathology in detail, we proceeded to discuss the different treatment options between surgery, radiation, antiestrogen therapies.  Recommendations: 1. Breast conserving surgery (patient surgery is being delayed because of cardiac issues) followed by  2. +/- Adjuvant radiation therapy followed by 3. Adjuvant antiestrogen therapy  Given her advanced age, she is not done a candidate for chemotherapy. Because of this we will not obtain Oncotype DX testing. Because of the delay in starting surgery, I discussed with her with neoadjuvant antiestrogen therapy. I sent a prescription for letrozole 2.5 mg by mouth daily.  Letrozole counseling: We discussed the risks and benefits of anti-estrogen therapy with aromatase inhibitors. These include but not limited to insomnia, hot flashes, mood changes, vaginal dryness, bone density loss, and weight gain. We strongly believe that the benefits far outweigh the risks. Patient understands these risks and consented to starting treatment. Planned treatment duration is 5 years.  Return to clinic after surgery.

## 2016-08-13 ENCOUNTER — Encounter (HOSPITAL_BASED_OUTPATIENT_CLINIC_OR_DEPARTMENT_OTHER): Payer: Self-pay | Admitting: *Deleted

## 2016-08-13 ENCOUNTER — Encounter (HOSPITAL_BASED_OUTPATIENT_CLINIC_OR_DEPARTMENT_OTHER)
Admission: RE | Admit: 2016-08-13 | Discharge: 2016-08-13 | Disposition: A | Discharge status: Home / Self Care | Payer: Medicare Other | Source: Ambulatory Visit | Attending: General Surgery | Admitting: General Surgery

## 2016-08-13 DIAGNOSIS — C50912 Malignant neoplasm of unspecified site of left female breast: Secondary | ICD-10-CM | POA: Diagnosis not present

## 2016-08-13 DIAGNOSIS — Z01812 Encounter for preprocedural laboratory examination: Secondary | ICD-10-CM | POA: Insufficient documentation

## 2016-08-13 LAB — BASIC METABOLIC PANEL
ANION GAP: 12 (ref 5–15)
BUN: 17 mg/dL (ref 6–20)
CHLORIDE: 108 mmol/L (ref 101–111)
CO2: 24 mmol/L (ref 22–32)
Calcium: 10.1 mg/dL (ref 8.9–10.3)
Creatinine, Ser: 1.15 mg/dL — ABNORMAL HIGH (ref 0.44–1.00)
GFR calc Af Amer: 48 mL/min — ABNORMAL LOW (ref >60)
GFR, EST NON AFRICAN AMERICAN: 42 mL/min — AB (ref >60)
Glucose, Bld: 131 mg/dL — ABNORMAL HIGH (ref 65–99)
POTASSIUM: 3.7 mmol/L (ref 3.5–5.1)
SODIUM: 144 mmol/L (ref 135–145)

## 2016-08-14 ENCOUNTER — Ambulatory Visit
Admission: RE | Admit: 2016-08-14 | Discharge: 2016-08-14 | Disposition: A | Discharge status: Home / Self Care | Payer: Medicare Other | Source: Ambulatory Visit | Attending: General Surgery | Admitting: General Surgery

## 2016-08-14 DIAGNOSIS — C50912 Malignant neoplasm of unspecified site of left female breast: Secondary | ICD-10-CM

## 2016-08-18 ENCOUNTER — Encounter (HOSPITAL_BASED_OUTPATIENT_CLINIC_OR_DEPARTMENT_OTHER): Payer: Self-pay

## 2016-08-18 NOTE — Anesthesia Preprocedure Evaluation (Addendum)
Anesthesia Evaluation  Patient identified by MRN, date of birth, ID band Patient awake    Reviewed: Allergy & Precautions, NPO status , Patient's Chart, lab work & pertinent test results  Airway Mallampati: II  TM Distance: >3 FB Neck ROM: Full    Dental  (+) Dental Advisory Given, Lower Dentures, Upper Dentures   Pulmonary neg pulmonary ROS,    Pulmonary exam normal breath sounds clear to auscultation       Cardiovascular hypertension, Pt. on medications (-) angina(-) CAD, (-) Past MI and (-) CHF Normal cardiovascular exam Rhythm:Regular Rate:Normal  Echo 07/30/16: Study Conclusions  - Left ventricle: The cavity size was normal. There was moderate focal basal and mild concentric hypertrophy of the septum. Systolic function was normal. The estimated ejection fraction was in the range of 60% to 65%. Wall motion was normal; there were no regional wall motion abnormalities. Doppler parameters are consistent with abnormal left ventricular relaxation (grade 1 diastolic dysfunction). - Aortic valve: There was trivial regurgitation. - Left atrium: The atrium was mildly dilated.  Cardiology Note 07/09/16: Abnormal EKG  - Thankfully today's EKG does not demonstrate any significant Q waves in the inferior leads and her R-wave progression is improved. Her QT segment is quite flattened and fairly challenging to interpret. Prior EKG does demonstrate what looks like a prolonged QT interval and my suggestion is to try to avoid any medicinal agents that could lengthen QT, perhaps Zofran for instance after surgery. She has not had any unexpected syncope or racing palpitations.  - I will check an echocardiogram again to ensure proper structure and function of her heart. Previously this was normal.  - If echocardiogram is unremarkable, she may proceed with surgery with low overall cardiac risk.  Preoperative cardiovascular risk assessment  - She is not  experiencing any anginal symptoms. Even though she ambulates with a cane following her prior stroke, she is able to maintain her activity. We will check an echocardiogram to ensure proper structure and function. If normal EF, I feel comfortable allowing her to proceed.  Prolonged QT syndrome  - Various EKGs have demonstrated QT prolongation. She does have what appears to be a U wave in the early precordial leads. As is stated above, avoid Zofran postoperatively.   Neuro/Psych TIACVA, Residual Symptoms negative psych ROS   GI/Hepatic Neg liver ROS, PUD, GERD  Medicated,  Endo/Other  negative endocrine ROS  Renal/GU negative Renal ROS     Musculoskeletal  (+) Arthritis , Osteoarthritis,    Abdominal   Peds  Hematology  (+) Blood dyscrasia, anemia , Hemochromatosis    Anesthesia Other Findings Day of surgery medications reviewed with the patient.  Left breast cancer  Reproductive/Obstetrics                           Anesthesia Physical Anesthesia Plan  ASA: III  Anesthesia Plan: General   Post-op Pain Management:    Induction: Intravenous  Airway Management Planned: LMA  Additional Equipment:   Intra-op Plan:   Post-operative Plan: Extubation in OR  Informed Consent: I have reviewed the patients History and Physical, chart, labs and discussed the procedure including the risks, benefits and alternatives for the proposed anesthesia with the patient or authorized representative who has indicated his/her understanding and acceptance.   Dental advisory given  Plan Discussed with: CRNA  Anesthesia Plan Comments: (NO ZOFRAN given QT prolongation history.)       Anesthesia Quick Evaluation

## 2016-08-18 NOTE — H&P (Signed)
History of Present Illness  Patient words: Left breast Cancer.  The patient is a 82 year old female who presents with breast cancer. She is an 82 year old female referred by Dr. Autumn Patty for evaluation of recently diagnosed carcinoma of the left breast. She recently presented for a screening mamogram revealing a probable new mass in the left breast. She has been getting routine yearly screening mammograms. Subsequent imaging included diagnostic mamogram showing an irregularly marginated mass in the upper outer left breast at the 2 o'clock position 8 cm from the nipple and ultrasound showing an irregular hypoechoic mass at the same position measuring 1.2 cm. An ultrasound guided breast biopsy was performed on June 10, 2016 with pathology revealing invasive ductal carcinoma of the breast. She is seen now in the office for initial treatment planning. She has experienced no breast symptoms, specifically lump or pain, nipple discharge or skin changes. She states she did have bilateral breast surgery about 20 years ago underneath each breast for uncertain diagnosis but not cancer.  Findings at that time were the following: Tumor size: 1.2 cm Tumor grade: 1, Ki-67 5% Estrogen Receptor: 100% positive Progesterone Receptor: 100% positive Her-2 neu: Negative Lymph node status: Negative    Other Problems Arthritis  Cerebrovascular Accident  Diverticulosis  Gastric Ulcer  High blood pressure  Hypercholesterolemia  Lump In Breast  Oophorectomy   Past Surgical History  Breast Biopsy  Left. Cataract Surgery  Left. Hysterectomy (not due to cancer) - Complete  Shoulder Surgery  Right.  Diagnostic Studies History  Colonoscopy  1-5 years ago Mammogram  within last year  Allergies  Codeine Phosphate *ANALGESICS - OPIOID*  Ultram *ANALGESICS - OPIOID*   Medication History  AmLODIPine Besylate (5MG Tablet, Oral) Active. APAP Arthritis (650MG Tablet ER, Oral)  Active. Aspirin (325MG Tablet, Oral) Active. Atorvastatin Calcium (10MG Tablet, Oral) Active. Flonase Allergy Relief (50MCG/ACT Suspension, Nasal) Active. Methocarbamol (500MG Tablet, Oral) Active. Multivitamin Adults (Oral) Active. Lovaza (1GM Capsule, Oral) Active. Pantoprazole Sodium (40MG Tablet DR, Oral) Active. Ramipril (10MG Capsule, Oral) Active. Maxzide-25 (37.5-25MG Tablet, Oral) Active. Medications Reconciled  Social History  Caffeine use  Coffee.  Family History  Alcohol Abuse  Brother. Anesthetic complications  Daughter. Breast Cancer  Daughter, Family Members In General. Diabetes Mellitus  Daughter, Son. Hypertension  Daughter, Family Members In [place], Son. Seizure disorder  Daughter. Thyroid problems  Daughter.  Pregnancy / Birth History  Age at menarche  11 years. Contraceptive History  Oral contraceptives. Gravida  12 Irregular periods  Length (months) of breastfeeding  7-12 Maternal age  <15 Para  36    Review of Systems  General Not Present- Appetite Loss, Chills, Fatigue, Fever, Night Sweats, Weight Gain and Weight Loss. Skin Present- Change in Wart/Mole. Not Present- Dryness, Hives, Jaundice, New Lesions, Non-Healing Wounds, Rash and Ulcer. HEENT Present- Wears glasses/contact lenses. Not Present- Earache, Hearing Loss, Hoarseness, Nose Bleed, Oral Ulcers, Ringing in the Ears, Seasonal Allergies, Sinus Pain, Sore Throat, Visual Disturbances and Yellow Eyes. Breast Present- Breast Mass. Not Present- Breast Pain, Nipple Discharge and Skin Changes. Gastrointestinal Present- Change in Bowel Habits and Constipation. Not Present- Abdominal Pain, Bloating, Bloody Stool, Chronic diarrhea, Difficulty Swallowing, Excessive gas, Gets full quickly at meals, Hemorrhoids, Indigestion, Nausea, Rectal Pain and Vomiting. Female Genitourinary Present- Nocturia. Not Present- Frequency, Painful Urination, Pelvic Pain and Urgency. Neurological  Present- Decreased Memory, Trouble walking and Weakness. Not Present- Fainting, Headaches, Numbness, Seizures, Tingling and Tremor. Psychiatric Not Present- Anxiety, Bipolar, Change in Sleep Pattern, Depression, Fearful and Frequent  crying. Hematology Present- Blood Thinners. Not Present- Easy Bruising, Excessive bleeding, Gland problems, HIV and Persistent Infections.  Vitals   Weight: 170 lb Height: 63in Height was reported by patient. Body Surface Area: 1.8 m Body Mass Index: 30.11 kg/m  Temp.: 98.75F  Pulse: 94 (Regular)  BP: 130/88 (Sitting, Left Arm, Standard)       Physical Exam  The physical exam findings are as follows: Note:General: Alert, well-developed elderly African-American female who appears younger than her stated age, in no distress Skin: Warm and dry without rash or infection. HEENT: No palpable masses or thyromegaly. Sclera nonicteric. Pupils equal round and reactive. Lymph nodes: No cervical, supraclavicular, nodes palpable. Breasts: There is some slight thickening upper outer left breast likely secondary to the biopsy. No discrete masses in either breast. No skin changes or nipple inversion or crusting. There is no palpable axillary adenopathy. Lungs: Breath sounds clear and equal. No wheezing or increased work of breathing. Cardiovascular: Regular rate and rhythm without murmer. No JVD or edema. Peripheral pulses intact. No carotid bruits. Abdomen: Nondistended. Soft and nontender. No masses palpable. No organomegaly. No palpable hernias. Extremities: No edema or joint swelling or deformity. No chronic venous stasis changes. Neurologic: Alert and fully oriented. Gait normal. No focal weakness. Psychiatric: Normal mood and affect. Thought content appropriate with normal judgement and insight    Assessment & Plan  MALIGNANT NEOPLASM OF LEFT BREAST, STAGE 1, ESTROGEN RECEPTOR POSITIVE (C50.912) Impression: 82 year old female with a new diagnosis  of cancer of the left breast, upper outer quadrant. Clinical stage 1A, ER positive, PR positive, HER-2 negative. I discussed with the patient and family members present today initial surgical treatment options. We discussed options of breast conservation with lumpectomy or total mastectomy and sentinal lymph node biopsy/dissection she would appear to be an ideal candidate for lumpectomy which is what she would prefer. At age 82 with a early stage low grade tumor I did not recommend lymph node sampling. We discussed the indications and nature of the procedure, and expected recovery, in detail. Surgical risks including anesthetic complications, cardiorespiratory complications, bleeding, infection, wound healing complications, blood clots, lymphedema, local and distant recurrence and possible need for further surgery based on the final pathology was discussed and understood. Chemotherapy, hormonal therapy and radiation therapy have been discussed. They have been provided with literature regarding the treatment of breast cancer. All questions were answered. They understand and agree to proceed and we will go ahead with scheduling. Current Plans Referred to Radiation Oncology, for evaluation and follow up (Radiation Oncology). Routine. Referred to Oncology, for evaluation and follow up (Oncology). Routine. Radioactive seed localized left breast lumpectomy under general anesthesia as an outpatient

## 2016-08-19 ENCOUNTER — Ambulatory Visit
Admission: RE | Admit: 2016-08-19 | Discharge: 2016-08-19 | Disposition: A | Discharge status: Home / Self Care | Payer: Medicare Other | Source: Ambulatory Visit | Attending: General Surgery | Admitting: General Surgery

## 2016-08-19 ENCOUNTER — Encounter (HOSPITAL_BASED_OUTPATIENT_CLINIC_OR_DEPARTMENT_OTHER)
Admission: RE | Disposition: A | Discharge status: Home / Self Care | Payer: Self-pay | Source: Ambulatory Visit | Attending: General Surgery

## 2016-08-19 ENCOUNTER — Ambulatory Visit (HOSPITAL_BASED_OUTPATIENT_CLINIC_OR_DEPARTMENT_OTHER): Payer: Medicare Other | Admitting: Anesthesiology

## 2016-08-19 ENCOUNTER — Encounter: Payer: Self-pay | Admitting: Family Medicine

## 2016-08-19 ENCOUNTER — Encounter (HOSPITAL_BASED_OUTPATIENT_CLINIC_OR_DEPARTMENT_OTHER): Payer: Self-pay | Admitting: Anesthesiology

## 2016-08-19 ENCOUNTER — Ambulatory Visit (HOSPITAL_BASED_OUTPATIENT_CLINIC_OR_DEPARTMENT_OTHER)
Admission: RE | Admit: 2016-08-19 | Discharge: 2016-08-19 | Disposition: A | Discharge status: Home / Self Care | Payer: Medicare Other | Source: Ambulatory Visit | Attending: General Surgery | Admitting: General Surgery

## 2016-08-19 DIAGNOSIS — Z8673 Personal history of transient ischemic attack (TIA), and cerebral infarction without residual deficits: Secondary | ICD-10-CM | POA: Diagnosis not present

## 2016-08-19 DIAGNOSIS — K219 Gastro-esophageal reflux disease without esophagitis: Secondary | ICD-10-CM | POA: Insufficient documentation

## 2016-08-19 DIAGNOSIS — Z885 Allergy status to narcotic agent status: Secondary | ICD-10-CM | POA: Diagnosis not present

## 2016-08-19 DIAGNOSIS — Z7982 Long term (current) use of aspirin: Secondary | ICD-10-CM | POA: Diagnosis not present

## 2016-08-19 DIAGNOSIS — D0512 Intraductal carcinoma in situ of left breast: Secondary | ICD-10-CM | POA: Insufficient documentation

## 2016-08-19 DIAGNOSIS — Z170 Estrogen receptor positive status [ER+]: Secondary | ICD-10-CM | POA: Diagnosis not present

## 2016-08-19 DIAGNOSIS — Z833 Family history of diabetes mellitus: Secondary | ICD-10-CM | POA: Insufficient documentation

## 2016-08-19 DIAGNOSIS — Z811 Family history of alcohol abuse and dependence: Secondary | ICD-10-CM | POA: Insufficient documentation

## 2016-08-19 DIAGNOSIS — Z8349 Family history of other endocrine, nutritional and metabolic diseases: Secondary | ICD-10-CM | POA: Insufficient documentation

## 2016-08-19 DIAGNOSIS — Z79899 Other long term (current) drug therapy: Secondary | ICD-10-CM | POA: Insufficient documentation

## 2016-08-19 DIAGNOSIS — C50912 Malignant neoplasm of unspecified site of left female breast: Secondary | ICD-10-CM

## 2016-08-19 DIAGNOSIS — Z7951 Long term (current) use of inhaled steroids: Secondary | ICD-10-CM | POA: Insufficient documentation

## 2016-08-19 DIAGNOSIS — Z803 Family history of malignant neoplasm of breast: Secondary | ICD-10-CM | POA: Insufficient documentation

## 2016-08-19 DIAGNOSIS — Z820 Family history of epilepsy and other diseases of the nervous system: Secondary | ICD-10-CM | POA: Insufficient documentation

## 2016-08-19 DIAGNOSIS — I10 Essential (primary) hypertension: Secondary | ICD-10-CM | POA: Diagnosis not present

## 2016-08-19 DIAGNOSIS — E7800 Pure hypercholesterolemia, unspecified: Secondary | ICD-10-CM | POA: Insufficient documentation

## 2016-08-19 DIAGNOSIS — M1990 Unspecified osteoarthritis, unspecified site: Secondary | ICD-10-CM | POA: Diagnosis not present

## 2016-08-19 DIAGNOSIS — Z8711 Personal history of peptic ulcer disease: Secondary | ICD-10-CM | POA: Insufficient documentation

## 2016-08-19 DIAGNOSIS — C50412 Malignant neoplasm of upper-outer quadrant of left female breast: Secondary | ICD-10-CM | POA: Diagnosis present

## 2016-08-19 HISTORY — DX: Abnormal electrocardiogram (ECG) (EKG): R94.31

## 2016-08-19 HISTORY — PX: BREAST LUMPECTOMY WITH RADIOACTIVE SEED LOCALIZATION: SHX6424

## 2016-08-19 SURGERY — BREAST LUMPECTOMY WITH RADIOACTIVE SEED LOCALIZATION
Anesthesia: General | Site: Breast | Laterality: Left

## 2016-08-19 MED ORDER — FENTANYL CITRATE (PF) 100 MCG/2ML IJ SOLN
50.0000 ug | INTRAMUSCULAR | Status: DC | PRN
  Administered 2016-08-19: 50 ug via INTRAVENOUS

## 2016-08-19 MED ORDER — CEFAZOLIN SODIUM-DEXTROSE 2-4 GM/100ML-% IV SOLN
INTRAVENOUS | Status: AC
  Filled 2016-08-19: qty 100

## 2016-08-19 MED ORDER — CEFAZOLIN SODIUM-DEXTROSE 2-4 GM/100ML-% IV SOLN
2.0000 g | INTRAVENOUS | Status: AC
  Administered 2016-08-19: 2 g via INTRAVENOUS

## 2016-08-19 MED ORDER — LIDOCAINE 2% (20 MG/ML) 5 ML SYRINGE
INTRAMUSCULAR | Status: AC
  Filled 2016-08-19: qty 5

## 2016-08-19 MED ORDER — CELECOXIB 400 MG PO CAPS
400.0000 mg | ORAL_CAPSULE | ORAL | Status: AC
  Administered 2016-08-19: 400 mg via ORAL

## 2016-08-19 MED ORDER — CHLORHEXIDINE GLUCONATE CLOTH 2 % EX PADS: 6.0000 | MEDICATED_PAD | Freq: Once | CUTANEOUS | Status: DC

## 2016-08-19 MED ORDER — GLYCOPYRROLATE 0.2 MG/ML IV SOSY
PREFILLED_SYRINGE | INTRAVENOUS | Status: DC | PRN
  Administered 2016-08-19: .2 mg via INTRAVENOUS

## 2016-08-19 MED ORDER — BUPIVACAINE-EPINEPHRINE (PF) 0.25% -1:200000 IJ SOLN
INTRAMUSCULAR | Status: AC
  Filled 2016-08-19: qty 30

## 2016-08-19 MED ORDER — SCOPOLAMINE 1 MG/3DAYS TD PT72
1.0000 | MEDICATED_PATCH | Freq: Once | TRANSDERMAL | Status: DC | PRN
Start: 2016-08-19 — End: 2016-08-19

## 2016-08-19 MED ORDER — CELECOXIB 200 MG PO CAPS
ORAL_CAPSULE | ORAL | Status: AC
  Filled 2016-08-19: qty 2

## 2016-08-19 MED ORDER — ONDANSETRON HCL 4 MG/2ML IJ SOLN: 4.0000 mg | Freq: Once | INTRAMUSCULAR | Status: DC | PRN

## 2016-08-19 MED ORDER — LIDOCAINE 2% (20 MG/ML) 5 ML SYRINGE
INTRAMUSCULAR | Status: DC | PRN
  Administered 2016-08-19: 60 mg via INTRAVENOUS

## 2016-08-19 MED ORDER — PROPOFOL 10 MG/ML IV BOLUS
INTRAVENOUS | Status: AC
  Filled 2016-08-19: qty 20

## 2016-08-19 MED ORDER — BUPIVACAINE-EPINEPHRINE (PF) 0.25% -1:200000 IJ SOLN
INTRAMUSCULAR | Status: DC | PRN
  Administered 2016-08-19: 30 mL

## 2016-08-19 MED ORDER — DEXAMETHASONE SODIUM PHOSPHATE 4 MG/ML IJ SOLN
INTRAMUSCULAR | Status: DC | PRN
  Administered 2016-08-19: 10 mg via INTRAVENOUS

## 2016-08-19 MED ORDER — FENTANYL CITRATE (PF) 100 MCG/2ML IJ SOLN
INTRAMUSCULAR | Status: AC
  Filled 2016-08-19: qty 2

## 2016-08-19 MED ORDER — MIDAZOLAM HCL 2 MG/2ML IJ SOLN: 1.0000 mg | INTRAMUSCULAR | Status: DC | PRN

## 2016-08-19 MED ORDER — FENTANYL CITRATE (PF) 100 MCG/2ML IJ SOLN: 25.0000 ug | INTRAMUSCULAR | Status: DC | PRN

## 2016-08-19 MED ORDER — LACTATED RINGERS IV SOLN
INTRAVENOUS | Status: DC
  Administered 2016-08-19: 10 mL/h via INTRAVENOUS
  Administered 2016-08-19: 07:00:00 via INTRAVENOUS

## 2016-08-19 MED ORDER — DEXAMETHASONE SODIUM PHOSPHATE 10 MG/ML IJ SOLN
INTRAMUSCULAR | Status: AC
  Filled 2016-08-19: qty 1

## 2016-08-19 MED ORDER — ACETAMINOPHEN 500 MG PO TABS
ORAL_TABLET | ORAL | Status: AC
  Filled 2016-08-19: qty 2

## 2016-08-19 MED ORDER — ACETAMINOPHEN 500 MG PO TABS
1000.0000 mg | ORAL_TABLET | ORAL | Status: AC
  Administered 2016-08-19: 1000 mg via ORAL

## 2016-08-19 MED ORDER — HYDROCODONE-ACETAMINOPHEN 5-325 MG PO TABS: 1.0000 | ORAL_TABLET | ORAL | 0 refills | Status: DC | PRN

## 2016-08-19 MED ORDER — PROPOFOL 10 MG/ML IV BOLUS
INTRAVENOUS | Status: DC | PRN
  Administered 2016-08-19: 100 mg via INTRAVENOUS
  Administered 2016-08-19: 50 mg via INTRAVENOUS

## 2016-08-19 SURGICAL SUPPLY — 51 items
BINDER BREAST LRG (GAUZE/BANDAGES/DRESSINGS) IMPLANT
BINDER BREAST MEDIUM (GAUZE/BANDAGES/DRESSINGS) IMPLANT
BINDER BREAST XLRG (GAUZE/BANDAGES/DRESSINGS) ×3 IMPLANT
BINDER BREAST XXLRG (GAUZE/BANDAGES/DRESSINGS) IMPLANT
BLADE SURG 15 STRL LF DISP TIS (BLADE) ×1 IMPLANT
BLADE SURG 15 STRL SS (BLADE) ×2
CANISTER SUC SOCK COL 7IN (MISCELLANEOUS) IMPLANT
CANISTER SUCT 1200ML W/VALVE (MISCELLANEOUS) IMPLANT
CHLORAPREP W/TINT 26ML (MISCELLANEOUS) ×3 IMPLANT
CLIP TI WIDE RED SMALL 6 (CLIP) ×3 IMPLANT
COVER BACK TABLE 60X90IN (DRAPES) ×3 IMPLANT
COVER MAYO STAND STRL (DRAPES) ×3 IMPLANT
COVER PROBE W GEL 5X96 (DRAPES) ×3 IMPLANT
DECANTER SPIKE VIAL GLASS SM (MISCELLANEOUS) IMPLANT
DERMABOND ADVANCED (GAUZE/BANDAGES/DRESSINGS) ×2
DERMABOND ADVANCED .7 DNX12 (GAUZE/BANDAGES/DRESSINGS) ×1 IMPLANT
DEVICE DUBIN W/COMP PLATE 8390 (MISCELLANEOUS) ×3 IMPLANT
DRAPE LAPAROSCOPIC ABDOMINAL (DRAPES) ×3 IMPLANT
DRAPE UTILITY XL STRL (DRAPES) ×3 IMPLANT
DRSG PAD ABDOMINAL 8X10 ST (GAUZE/BANDAGES/DRESSINGS) ×3 IMPLANT
ELECT COATED BLADE 2.86 ST (ELECTRODE) ×3 IMPLANT
ELECT REM PT RETURN 9FT ADLT (ELECTROSURGICAL) ×3
ELECTRODE REM PT RTRN 9FT ADLT (ELECTROSURGICAL) ×1 IMPLANT
GLOVE BIO SURGEON STRL SZ 6.5 (GLOVE) ×2 IMPLANT
GLOVE BIO SURGEONS STRL SZ 6.5 (GLOVE) ×1
GLOVE BIOGEL PI IND STRL 6.5 (GLOVE) ×1 IMPLANT
GLOVE BIOGEL PI IND STRL 8 (GLOVE) ×1 IMPLANT
GLOVE BIOGEL PI INDICATOR 6.5 (GLOVE) ×2
GLOVE BIOGEL PI INDICATOR 8 (GLOVE) ×2
GLOVE ECLIPSE 7.5 STRL STRAW (GLOVE) ×6 IMPLANT
GLOVE EXAM NITRILE EXT CUFF MD (GLOVE) ×3 IMPLANT
GOWN STRL REUS W/ TWL LRG LVL3 (GOWN DISPOSABLE) ×1 IMPLANT
GOWN STRL REUS W/ TWL XL LVL3 (GOWN DISPOSABLE) ×1 IMPLANT
GOWN STRL REUS W/TWL LRG LVL3 (GOWN DISPOSABLE) ×2
GOWN STRL REUS W/TWL XL LVL3 (GOWN DISPOSABLE) ×2
ILLUMINATOR WAVEGUIDE N/F (MISCELLANEOUS) IMPLANT
KIT MARKER MARGIN INK (KITS) ×3 IMPLANT
NEEDLE HYPO 25X1 1.5 SAFETY (NEEDLE) ×3 IMPLANT
NS IRRIG 1000ML POUR BTL (IV SOLUTION) ×3 IMPLANT
PACK BASIN DAY SURGERY FS (CUSTOM PROCEDURE TRAY) ×3 IMPLANT
PENCIL BUTTON HOLSTER BLD 10FT (ELECTRODE) ×3 IMPLANT
SLEEVE SCD COMPRESS KNEE MED (MISCELLANEOUS) ×3 IMPLANT
SPONGE LAP 4X18 X RAY DECT (DISPOSABLE) ×3 IMPLANT
SUT MON AB 5-0 PS2 18 (SUTURE) ×3 IMPLANT
SUT VICRYL 3-0 CR8 SH (SUTURE) ×3 IMPLANT
SYR CONTROL 10ML LL (SYRINGE) ×3 IMPLANT
TOWEL OR 17X24 6PK STRL BLUE (TOWEL DISPOSABLE) ×3 IMPLANT
TOWEL OR NON WOVEN STRL DISP B (DISPOSABLE) IMPLANT
TUBE CONNECTING 20'X1/4 (TUBING)
TUBE CONNECTING 20X1/4 (TUBING) IMPLANT
YANKAUER SUCT BULB TIP NO VENT (SUCTIONS) IMPLANT

## 2016-08-19 NOTE — Interval H&P Note (Signed)
History and Physical Interval Note:  08/19/2016 7:28 AM  Dawn Church  has presented today for surgery, with the diagnosis of LEFT BREAST CANCER  The various methods of treatment have been discussed with the patient and family. After consideration of risks, benefits and other options for treatment, the patient has consented to  Procedure(s): LEFT BREAST LUMPECTOMY WITH RADIOACTIVE SEED LOCALIZATION (Left) as a surgical intervention .  The patient's history has been reviewed, patient examined, no change in status, stable for surgery.  I have reviewed the patient's chart and labs.  Questions were answered to the patient's satisfaction.     [name] T

## 2016-08-19 NOTE — Transfer of Care (Signed)
Immediate Anesthesia Transfer of Care Note  Patient: Dawn Church  Procedure(s) Performed: Procedure(s): LEFT BREAST LUMPECTOMY WITH RADIOACTIVE SEED LOCALIZATION (Left)  Patient Location: PACU  Anesthesia Type:General  Level of Consciousness: sedated, patient cooperative and responds to stimulation  Airway & Oxygen Therapy: Patient Spontanous Breathing and Patient connected to face mask oxygen  Post-op Assessment: Report given to RN and Post -op Vital signs reviewed and stable  Post vital signs: Reviewed and stable  Last Vitals:  Vitals:   08/19/16 0645  BP: (!) 155/74  Pulse: 74  Resp: 20  Temp: 36.7 C    Last Pain:  Vitals:   08/19/16 0645  TempSrc: Oral         Complications: No apparent anesthesia complications

## 2016-08-19 NOTE — Discharge Instructions (Signed)
[facility] °Office Phone Number [phone] ° °BREAST BIOPSY/ PARTIAL MASTECTOMY: POST OP INSTRUCTIONS ° °Always review your discharge instruction sheet given to you by the facility where your surgery was performed. ° °IF YOU HAVE DISABILITY OR FAMILY LEAVE FORMS, YOU MUST BRING THEM TO THE OFFICE FOR PROCESSING.  DO NOT GIVE THEM TO YOUR DOCTOR. ° °1. A prescription for pain medication may be given to you upon discharge.  Take your pain medication as prescribed, if needed.  If narcotic pain medicine is not needed, then you may take acetaminophen (Tylenol) or ibuprofen (Advil) as needed. °2. Take your usually prescribed medications unless otherwise directed °3. If you need a refill on your pain medication, please contact your pharmacy.  They will contact our office to request authorization.  Prescriptions will not be filled after 5pm or on week-ends. °4. You should eat very light the first 24 hours after surgery, such as soup, crackers, pudding, etc.  Resume your normal diet the day after surgery. °5. Most patients will experience some swelling and bruising in the breast.  Ice packs and a good support bra will help.  Swelling and bruising can take several days to resolve.  °6. It is common to experience some constipation if taking pain medication after surgery.  Increasing fluid intake and taking a stool softener will usually help or prevent this problem from occurring.  A mild laxative (Milk of Magnesia or Miralax) should be taken according to package directions if there are no bowel movements after 48 hours. °7. Unless discharge instructions indicate otherwise, you may remove your bandages 24-48 hours after surgery, and you may shower at that time.  You may have steri-strips (small skin tapes) in place directly over the incision.  These strips should be left on the skin for 7-10 days.  If your surgeon used skin glue on the incision, you may shower in 24 hours.  The glue will flake off over the  next 2-3 weeks.  Any sutures or staples will be removed at the office during your follow-up visit. °8. ACTIVITIES:  You may resume regular daily activities (gradually increasing) beginning the next day.  Wearing a good support bra or sports bra minimizes pain and swelling.  You may have sexual intercourse when it is comfortable. °a. You may drive when you no longer are taking prescription pain medication, you can comfortably wear a seatbelt, and you can safely maneuver your car and apply brakes. °b. RETURN TO WORK:  ______________________________________________________________________________________ °9. You should see your doctor in the office for a follow-up appointment approximately two weeks after your surgery.  Your doctor’s nurse will typically make your follow-up appointment when she calls you with your pathology report.  Expect your pathology report 2-3 business days after your surgery.  You may call to check if you do not hear from us after three days. °10. OTHER INSTRUCTIONS: _______________________________________________________________________________________________ _____________________________________________________________________________________________________________________________________ °_____________________________________________________________________________________________________________________________________ °_____________________________________________________________________________________________________________________________________ ° °WHEN TO CALL YOUR DOCTOR: °1. Fever over 101.0 °2. Nausea and/or vomiting. °3. Extreme swelling or bruising. °4. Continued bleeding from incision. °5. Increased pain, redness, or drainage from the incision. ° °The clinic staff is available to answer your questions during regular business hours.  Please don’t hesitate to call and ask to speak to one of the nurses for clinical concerns.  If you have a medical emergency, go to the nearest  emergency room or call 911.  A surgeon from [facility] is always on call at the hospital. ° °For further questions, please visit [email or web address]  ° ° ° °  Post Anesthesia Home Care Instructions ° °Activity: °Get plenty of rest for the remainder of the day. A responsible adult should stay with you for 24 hours following the procedure.  °For the next 24 hours, DO NOT: °-Drive a car °-Operate machinery °-Drink alcoholic beverages °-Take any medication unless instructed by your physician °-Make any legal decisions or sign important papers. ° °Meals: °Start with liquid foods such as gelatin or soup. Progress to regular foods as tolerated. Avoid greasy, spicy, heavy foods. If nausea and/or vomiting occur, drink only clear liquids until the nausea and/or vomiting subsides. Call your physician if vomiting continues. ° °Special Instructions/Symptoms: °Your throat may feel dry or sore from the anesthesia or the breathing tube placed in your throat during surgery. If this causes discomfort, gargle with warm salt water. The discomfort should disappear within 24 hours. ° °If you had a scopolamine patch placed behind your ear for the management of post- operative nausea and/or vomiting: ° °1. The medication in the patch is effective for 72 hours, after which it should be removed.  Wrap patch in a tissue and discard in the trash. Wash hands thoroughly with soap and water. °2. You may remove the patch earlier than 72 hours if you experience unpleasant side effects which may include dry mouth, dizziness or visual disturbances. °3. Avoid touching the patch. Wash your hands with soap and water after contact with the patch. °  ° °

## 2016-08-19 NOTE — Op Note (Signed)
Preoperative Diagnosis: LEFT BREAST CANCER  Postoprative Diagnosis: LEFT BREAST CANCER  Procedure: Procedure(s): LEFT BREAST LUMPECTOMY WITH RADIOACTIVE SEED LOCALIZATION   Surgeon: Excell Seltzer T   Assistants: None  Anesthesia:  General LMA anesthesia  Indications: Patient is an 82 year old female with a recent diagnosis of stage IA invasive ductal carcinoma of the upper outer left breast, 1.5 cm on imaging, low grade and ER/PR positive. After discussion regarding treatment options detailed elsewhere we have elected to proceed with radioactive seed localized left breast lumpectomy as initial surgical therapy.    Procedure Detail:  She had previously undergone accurate placement of a radioactive seed at the tumor clip site in the upper outer left breast. See placement was confirmed in the holding area. She is taken to the operating room, placed in the supine position on the operating table and laryngeal mask general anesthesia induced. She received preoperative IV antibiotics. PAS were in place. The left breast was widely sterilely prepped and draped. Patient timeout was performed and correct procedure verified. The neoprobe was used to localize the seed in the upper outer left breast. A curvilinear incision was made in the skin crease and dissection carried down through the subcutaneous tissue toward the breast capsule. As the dissection deepened it was widened in all directions and a generous, proximally 5 cm specimen of breast tissue was excised around the seed using the neoprobe for guidance. The specimen was removed and inked for margins. Specimen x-ray showed the seed and the marking clip as well as the small mass centrally located within the specimen. This was sent for permanent pathology. Complete hemostasis was obtained in the lumpectomy cavity. The cavity was marked with clips. The deep breast and saphenous tissue was closed with interrupted 3-0 Vicryl and the skin with running  subcuticular 5-0 Monocryl and Dermabond. Sponge needle and estimate counts were correct.    Findings: As above  Estimated Blood Loss:  Minimal         Drains: None  Blood Given: none          Specimens: Left breast lumpectomy        Complications:  * No complications entered in OR log *         Disposition: PACU - hemodynamically stable.         Condition: stable

## 2016-08-19 NOTE — Anesthesia Procedure Notes (Signed)
Procedure Name: Intubation Date/Time: 08/19/2016 7:47 AM Performed by: Lyndee Leo Pre-anesthesia Checklist: Patient identified, Emergency Drugs available, Suction available and Patient being monitored Patient Re-evaluated:Patient Re-evaluated prior to inductionOxygen Delivery Method: Circle system utilized Preoxygenation: Pre-oxygenation with 100% oxygen Intubation Type: IV induction Ventilation: Mask ventilation without difficulty Laryngoscope Size: Mac and 3 Grade View: Grade II Tube type: Oral Tube size: 7.0 mm Number of attempts: 1 Airway Equipment and Method: Stylet and Oral airway Placement Confirmation: ETT inserted through vocal cords under direct vision,  positive ETCO2 and breath sounds checked- equal and bilateral Secured at: 20 cm Tube secured with: Tape Dental Injury: Bloody posterior oropharynx  Comments: Unable to seat LMA; pt edentulous.

## 2016-08-19 NOTE — Anesthesia Postprocedure Evaluation (Signed)
Anesthesia Post Note  Patient: Dawn Church  Procedure(s) Performed: Procedure(s) (LRB): LEFT BREAST LUMPECTOMY WITH RADIOACTIVE SEED LOCALIZATION (Left)  Patient location during evaluation: PACU Anesthesia Type: General Level of consciousness: awake and alert Pain management: pain level controlled Vital Signs Assessment: post-procedure vital signs reviewed and stable Respiratory status: spontaneous breathing, nonlabored ventilation, respiratory function stable and patient connected to nasal cannula oxygen Cardiovascular status: blood pressure returned to baseline and stable Postop Assessment: no signs of nausea or vomiting Anesthetic complications: no       Last Vitals:  Vitals:   08/19/16 0930 08/19/16 0946  BP: 140/67 (!) 144/70  Pulse: 70 76  Resp: 15 16  Temp:      Last Pain:  Vitals:   08/19/16 0946  TempSrc:   PainSc: 0-No pain                 Catalina Gravel

## 2016-08-20 ENCOUNTER — Encounter (HOSPITAL_BASED_OUTPATIENT_CLINIC_OR_DEPARTMENT_OTHER): Payer: Self-pay | Admitting: General Surgery

## 2016-08-29 NOTE — Progress Notes (Signed)
Location of Breast Cancer: Left Breast  Upper Outer Quadrant  Histology per Pathology Report: Diagnosis 06/10/2016: Breast, left, needle core biopsy, left upper outer quadrant 2 o'clock - INVASIVE AND IN SITU DUCTAL CARCINOMA.- SEE MICROSCOPIC DESCRIPTION  Receptor Status: ER(100%+), PR (100%+) Her2-neu (neg), Ki-(5%)  Did patient present with symptoms (if so, please note symptoms) or was this found on screening mammography?: Screening   Past/Anticipated interventions by surgeon, if XEN:[id number] 08/19/16: Dr. Marland Kitchen [place],MD: Breast, lumpectomy, Left with radioactive seed localization:  - INVASIVE AND IN SITU DUCTAL CARCINOMA, 1.3 CM.- MARGINS NOT INVOLVED.- DCIS FOCALLY 0.1 CM FROM SUPERIOR, INFERIOR, MEDIAL, LATERAL AND ANTERIOR MARGINS.- PREVIOUS BIOPSY SITE.- SEE ONCOLOGY TABLE  Past/Anticipated interventions by medical oncology, if any: Chemotherapy Dr. Lindi Adie 09/03/2017 Lymphedema issues, if any:  NO  Pain issues, if any:  NO, incision well healed left lateral breat with glue on site  SAFETY ISSUES:  Prior radiation? NO  Pacemaker/ICD? NO  Possible current pregnancy?N/A  Is the patient on methotrexate?   Current Complaints / other  details:  Menarche age 70, G77 P25,   HX CVA, 2015, Left sided weakness, uses 4 prong cane Daughter breast cancer,, Inez [place] [phone], surgery,lumpectomy,  chemotherapy, radiation at [place] now age 81,  No other family members with cancer BP (!) 163/62 (BP Location: Right Arm, Patient Position: Sitting, Cuff Size: Normal)   Pulse 73   Temp 98.2 F (36.8 C) (Oral)   Resp 16   Ht 5' 3"  (1.6 m)   Wt 168 lb (76.2 kg)   LMP  (LMP Unknown)   BMI 29.76 kg/m   Wt Readings from Last 3 Encounters:  09/03/16 168 lb (76.2 kg)  08/19/16 169 lb (76.7 kg)  07/09/16 170 lb (77.1 kg)   Rebecca Eaton, RN 08/29/2016,8:31 AM

## 2016-09-02 ENCOUNTER — Other Ambulatory Visit: Payer: Self-pay | Admitting: Family Medicine

## 2016-09-02 NOTE — Assessment & Plan Note (Signed)
Left Lumpectomy 08/19/16: IDC with DCIS 1.3 cm, DCIS close to 0.1 cm to all margins;ER 100%, PR 100%, Ki-67 5%, HER-2 negative ratio 1.05, T1cNx (Stage 1A)  Pathology counseling: I discussed the final pathology report of the patient provided  a copy of this report. I discussed the margins as well as lymph node surgeries. We also discussed the final staging along with previously performed ER/PR and HER-2/neu testing.  Plan: 1. Adj anti estrogen therapy with Letrozole  RTC in 3 months for tox check

## 2016-09-03 ENCOUNTER — Encounter: Payer: Self-pay | Admitting: Hematology and Oncology

## 2016-09-03 ENCOUNTER — Ambulatory Visit (HOSPITAL_BASED_OUTPATIENT_CLINIC_OR_DEPARTMENT_OTHER): Payer: Medicare Other | Admitting: Hematology and Oncology

## 2016-09-03 ENCOUNTER — Encounter: Payer: Self-pay | Admitting: Genetic Counselor

## 2016-09-03 ENCOUNTER — Ambulatory Visit
Admission: RE | Admit: 2016-09-03 | Discharge: 2016-09-03 | Disposition: A | Discharge status: Home / Self Care | Payer: Medicare Other | Source: Ambulatory Visit | Attending: Radiation Oncology | Admitting: Radiation Oncology

## 2016-09-03 VITALS — BP 163/62 | HR 73 | Temp 98.2°F | Resp 16 | Ht 63.0 in | Wt 168.0 lb

## 2016-09-03 DIAGNOSIS — Z803 Family history of malignant neoplasm of breast: Secondary | ICD-10-CM | POA: Diagnosis not present

## 2016-09-03 DIAGNOSIS — Z170 Estrogen receptor positive status [ER+]: Secondary | ICD-10-CM | POA: Insufficient documentation

## 2016-09-03 DIAGNOSIS — Z7951 Long term (current) use of inhaled steroids: Secondary | ICD-10-CM | POA: Insufficient documentation

## 2016-09-03 DIAGNOSIS — Z79899 Other long term (current) drug therapy: Secondary | ICD-10-CM | POA: Insufficient documentation

## 2016-09-03 DIAGNOSIS — Z8711 Personal history of peptic ulcer disease: Secondary | ICD-10-CM | POA: Diagnosis not present

## 2016-09-03 DIAGNOSIS — C50412 Malignant neoplasm of upper-outer quadrant of left female breast: Secondary | ICD-10-CM

## 2016-09-03 DIAGNOSIS — K219 Gastro-esophageal reflux disease without esophagitis: Secondary | ICD-10-CM | POA: Insufficient documentation

## 2016-09-03 DIAGNOSIS — Z7982 Long term (current) use of aspirin: Secondary | ICD-10-CM | POA: Diagnosis not present

## 2016-09-03 DIAGNOSIS — I10 Essential (primary) hypertension: Secondary | ICD-10-CM | POA: Diagnosis not present

## 2016-09-03 DIAGNOSIS — Z510 Encounter for antineoplastic radiation therapy: Secondary | ICD-10-CM | POA: Diagnosis present

## 2016-09-03 DIAGNOSIS — I69354 Hemiplegia and hemiparesis following cerebral infarction affecting left non-dominant side: Secondary | ICD-10-CM | POA: Diagnosis not present

## 2016-09-03 DIAGNOSIS — E7800 Pure hypercholesterolemia, unspecified: Secondary | ICD-10-CM | POA: Diagnosis not present

## 2016-09-03 NOTE — Progress Notes (Signed)
Radiation Oncology         [phone]) [phone] ________________________________  Name: Dawn Church MRN: [id number]  Date: 09/03/2016  DOB: 03/08/1928  NW:GNFAOZ, Lonell Grandchild, MD  Excell Seltzer, MD     REFERRING PHYSICIAN: Excell Seltzer, MD   DIAGNOSIS: The encounter diagnosis was Malignant neoplasm of upper-outer quadrant of left breast in female, estrogen receptor positive ([place]).   HISTORY OF PRESENT ILLNESS: Dawn Church is a 83 y.o. female seen at the request of Dr. Excell Seltzer for a newly diagnosed left sided breast cancer. She was found to have a breast abnormality in October 2017 on screening mammogram. Diagnostic imaging revealed a mass measuring 1.2 cm at the 2 o'clock position of the left breast. Biopsy at that time revealed a grade 2 invasive ductal carcinoma with DCIS, ER/PR positive, HER2 negative. She went on to proceed to surgery on 08/19/16 where she had a left breast lumpectomy revealing invasive ductal carcinoma of the breast with DCIS. Margins were negative, however DCIS was noted in all but the posterior margin within 1 mm away from the margin. She has plans to start antiestrogen therapy, and comes to discuss the options to consider radiotherapy for her disease as well.   PREVIOUS RADIATION THERAPY: No   PAST MEDICAL HISTORY:  Past Medical History:  Diagnosis Date  . Anemia   . Ankle swelling   . Cancer ([place]) 06/22/2016   left breast DCIS  . CVA (cerebral infarction)   . Epigastric pain   . GERD (gastroesophageal reflux disease)   . Hemochromatosis   . Hx of seasonal allergies   . Hypercholesteremia   . Hypertension   . Prolonged QT interval   . Stomach ulcer   . Stroke ([place]) 2015  . TIA (transient ischemic attack)     Of note, the patient's age at first menstrual period was 70. She has a daughter with a history of breast cancer at age 6, but reports no other family members with a known history of cancer.   PAST SURGICAL HISTORY: Past Surgical History:   Procedure Laterality Date  . APPENDECTOMY    . BREAST BIOPSY     Bilateral  . BREAST BIOPSY     bilateral  . BREAST LUMPECTOMY WITH RADIOACTIVE SEED LOCALIZATION Left 08/19/2016   Procedure: LEFT BREAST LUMPECTOMY WITH RADIOACTIVE SEED LOCALIZATION;  Surgeon: Excell Seltzer, MD;  Location: [facility];  Service: General;  Laterality: Left;  . CATARACT EXTRACTION, BILATERAL    . ESOPHAGOGASTRODUODENOSCOPY  12/20/2010  . SHOULDER OPEN ROTATOR CUFF REPAIR     right   . VAGINAL HYSTERECTOMY       FAMILY HISTORY:  Family History  Problem Relation Age of Onset  . Diabetes    . Cancer    . Colon cancer Neg Hx      SOCIAL HISTORY:  reports that she has never smoked. She has never used smokeless tobacco. She reports that she does not drink alcohol or use drugs.   ALLERGIES: Codeine and Ultram [tramadol]   MEDICATIONS:  Current Outpatient Prescriptions  Medication Sig Dispense Refill  . Acetaminophen (APAP ARTHRITIS PO) Take 2 tablets by mouth as needed.    Marland Kitchen amLODipine (NORVASC) 5 MG tablet TAKE 1 TABLET (5 MG TOTAL) BY MOUTH EVERY MORNING. 90 tablet 2  . aspirin 325 MG tablet Take 325 mg by mouth daily.    Marland Kitchen atorvastatin (LIPITOR) 10 MG tablet TAKE 1 TABLET (10 MG TOTAL) BY MOUTH AT BEDTIME. 90 tablet 2  . fluticasone (  FLONASE) 50 MCG/ACT nasal spray Place 2 sprays into both nostrils daily. 48 g 3  . HYDROcodone-acetaminophen (NORCO/VICODIN) 5-325 MG tablet Take 1-2 tablets by mouth every 4 (four) hours as needed for moderate pain or severe pain. 10 tablet 0  . LINZESS 72 MCG capsule TAKE 1 CAPSULE (72 MCG TOTAL) BY MOUTH DAILY BEFORE BREAKFAST. 30 capsule 0  . Multiple Vitamin (MULTIVITAMIN) tablet Take 1 tablet by mouth daily.      Marland Kitchen omega-3 acid ethyl esters (LOVAZA) 1 G capsule TAKE 1 CAPSULE (1 G TOTAL) BY MOUTH 2 (TWO) TIMES DAILY. 180 capsule 2  . pantoprazole (PROTONIX) 40 MG tablet TAKE 1 TABLET (40 MG TOTAL) BY MOUTH DAILY BEFORE BREAKFAST. 90 tablet 2    . ramipril (ALTACE) 10 MG capsule Take 1 capsule (10 mg total) by mouth 2 (two) times daily. 180 capsule 2  . triamterene-hydrochlorothiazide (MAXZIDE-25) 37.5-25 MG tablet TAKE 1 TABLET BY MOUTH EVERY DAY 90 tablet 0  . methocarbamol (ROBAXIN) 500 MG tablet Take 1 tablet (500 mg total) by mouth every 6 (six) hours as needed for muscle spasms. (Patient not taking: Reported on 09/03/2016) 60 tablet 2   No current facility-administered medications for this encounter.      REVIEW OF SYSTEMS: On review of systems, the patient reports that she is doing well overall. She denies any chest pain, shortness of breath, cough, fevers, chills, night sweats, unintended weight changes. She denies any bowel or bladder disturbances, and denies abdominal pain, nausea or vomiting. She denies any new musculoskeletal or joint aches or pains. A complete review of systems is obtained and is otherwise negative.     PHYSICAL EXAM:  Wt Readings from Last 3 Encounters:  09/03/16 168 lb (76.2 kg)  08/19/16 169 lb (76.7 kg)  07/09/16 170 lb (77.1 kg)   Temp Readings from Last 3 Encounters:  09/03/16 98.2 F (36.8 C) (Oral)  08/19/16 97.7 F (36.5 C)  06/13/16 98.1 F (36.7 C) (Oral)   BP Readings from Last 3 Encounters:  09/03/16 (!) 163/62  08/19/16 (!) 144/70  07/09/16 140/82   Pulse Readings from Last 3 Encounters:  09/03/16 73  08/19/16 76  07/09/16 73    In general this is a well appearing African American female in no acute distress. She is alert and oriented x4 and appropriate throughout the examination. HEENT reveals that the patient is normocephalic, atraumatic. EOMs are intact. PERRLA. Skin is intact without any evidence of gross lesions. Cardiovascular exam reveals a regular rate and rhythm, no clicks rubs or murmurs are auscultated. Chest is clear to auscultation bilaterally. Lymphatic assessment is performed and does not reveal any adenopathy in the cervical, supraclavicular, axillary, or  inguinal chains. The left breast has a well healing lumpectomy scar without cellulitic change. No induration is noted. The left breast is without palpable abnormalities. Abdomen has active bowel sounds in all quadrants and is intact. The abdomen is soft, non tender, non distended. Lower extremities are negative for pretibial pitting edema, deep calf tenderness, cyanosis or clubbing.   ECOG = 0  0 - Asymptomatic (Fully active, able to carry on all predisease activities without restriction)  1 - Symptomatic but completely ambulatory (Restricted in physically strenuous activity but ambulatory and able to carry out work of a light or sedentary nature. For example, light housework, office work)  2 - Symptomatic, <50% in bed during the day (Ambulatory and capable of all self care but unable to carry out any work activities. Up and about more  than 50% of waking hours)  3 - Symptomatic, >50% in bed, but not bedbound (Capable of only limited self-care, confined to bed or chair 50% or more of waking hours)  4 - Bedbound (Completely disabled. Cannot carry on any self-care. Totally confined to bed or chair)  5 - Death   Eustace Pen MM, Creech RH, Tormey DC, et al. [phone]). "Toxicity and response criteria of the [facility]". [place] Oncol. 5 (6): 649-55    LABORATORY DATA:  Lab Results  Component Value Date   WBC 6.9 04/23/2016   HGB 12.1 04/23/2016   HCT 37.3 04/23/2016   MCV 97.6 04/23/2016   PLT 237 04/23/2016   Lab Results  Component Value Date   NA 144 08/13/2016   K 3.7 08/13/2016   CL 108 08/13/2016   CO2 24 08/13/2016   Lab Results  Component Value Date   ALT 12 04/23/2016   AST 17 04/23/2016   ALKPHOS 68 04/23/2016   BILITOT 0.3 04/23/2016      RADIOGRAPHY: Mm Breast Surgical Specimen  Result Date: 08/19/2016 CLINICAL DATA:  Post left breast lumpectomy. EXAM: SPECIMEN RADIOGRAPH OF THE LEFT BREAST COMPARISON:  Previous exam(s). FINDINGS: Status post  excision of the left breast. The radioactive seed and biopsy marker clip are present, completely intact, and were marked for pathology. IMPRESSION: Specimen radiograph of the left breast. Electronically Signed   By: Everlean Alstrom M.D.   On: 08/19/2016 08:18   Mm Lt Radioactive Seed Loc Mammo Guide  Result Date: 08/14/2016 CLINICAL DATA:  Left breast mass EXAM: MAMMOGRAPHIC GUIDED RADIOACTIVE SEED LOCALIZATION OF THE left BREAST COMPARISON:  Previous exam(s). FINDINGS: Patient presents for radioactive seed localization prior to left breast surgery. I met with the patient and we discussed the procedure of seed localization including benefits and alternatives. We discussed the high likelihood of a successful procedure. We discussed the risks of the procedure including infection, bleeding, tissue injury and further surgery. We discussed the low dose of radioactivity involved in the procedure. Informed, written consent was given. The usual time-out protocol was performed immediately prior to the procedure. Using mammographic guidance, sterile technique, 1% lidocaine and an I-125 radioactive seed, left breast mass was localized using a superior approach. The follow-up mammogram images confirm the seed in the expected location and were marked for the surgeon. Follow-up survey of the patient confirms presence of the radioactive seed. Order number of I-125 seed:  [id number]. Total activity:  3.154 millicuries  Reference Date: August 05, 2016 The patient tolerated the procedure well and was released from the [facility]. She was given instructions regarding seed removal. IMPRESSION: Radioactive seed localization left breast. No apparent complications. Electronically Signed   By: Dorise Bullion III M.D   On: 08/14/2016 15:09       IMPRESSION/PLAN: 1. Stage IA, T1c N0, Mx, grade 1-2, ER/PR positive invasive ductal carcinoma with  DCIS of the left breast. Dr. [name] discusses the pathology findings and reviews the  nature of in situ disease. The patient has completed surgical excision about two weeks ago. She was encouraged now to proceed with external radiotherapy to the breast followed by antiestrogen therapy under the care of Dr. Lindi Adie. We discussed the risks, benefits, short, and long term effects of radiotherapy, and the patient is interested in proceeding. Dr. [name] discusses the delivery and logistics of radiotherapy. We would offer her a 4 week course of radiotherapy. A consent form is discussed, signed, and placed in the patient's chart. She  will be scheduled for CT Simulation next week with intent to begin treatment shortly after.  The above documentation reflects my direct findings during this shared patient visit. Please see the separate note by Dr. [name] on this date for the remainder of the patient's plan of care.    Carola Rhine, PAC   This document serves as a record of services personally performed by Kyung Rudd, MD and Shona Simpson, PA. It was created on their behalf by Maryla Morrow, a trained medical scribe. The creation of this record is based on the scribe's personal observations and the provider's statements to them. This document has been checked and approved by the attending provider.

## 2016-09-03 NOTE — Progress Notes (Signed)
Patient Care Team: Alycia Rossetti, MD as PCP - General (Family Medicine)  DIAGNOSIS:  Encounter Diagnosis  Name Primary?  . Malignant neoplasm of upper-outer quadrant of left breast in female, estrogen receptor positive ([place])     SUMMARY OF ONCOLOGIC HISTORY:   Breast cancer of upper-outer quadrant of left female breast ([place])   06/10/2016 Initial Diagnosis    Left breast biopsy 2:00 position: Grade 2 IDC with DCIS, ER 100%, PR 100%, Ki-67 5%, HER-2 negative ratio 1.05, 1.2 cm irregular mass left breast 2:00 position 8 cm from nipple, T1c N0 stage IA clinical stage      08/19/2016 Surgery    Left Lumpectomy: IDC with DCIS 1.3 cm, DCIS close to 0.1 cm to all margins;ER 100%, PR 100%, Ki-67 5%, HER-2 negative ratio 1.05, T1cNx (Stage 1A)       CHIEF COMPLIANT: Follow-up after left lumpectomy  INTERVAL HISTORY: LABRENDA LASKY is a 83 year old with above-mentioned history left breast cancer underwent lumpectomy and is here today to discuss the pathology report. She is healing and recovering well from the surgery. Pathology revealed a invasive ductal carcinoma measuring 1.3 cm in size it was ER/PR positive HER-2 negative. She had fairly close margins for DCIS. She was presented this morning in the multidisciplinary tumor board. She met with Dr. [name] who recommended adjuvant radiation because of close margins. Patient is agreeable with the radiation plan.  REVIEW OF SYSTEMS:   Constitutional: Denies fevers, chills or abnormal weight loss Eyes: Denies blurriness of vision Ears, nose, mouth, throat, and face: Denies mucositis or sore throat Respiratory: Denies cough, dyspnea or wheezes Cardiovascular: Denies palpitation, chest discomfort Gastrointestinal:  Denies nausea, heartburn or change in bowel habits Skin: Denies abnormal skin rashes Lymphatics: Denies new lymphadenopathy or easy bruising Neurological:Denies numbness, tingling or new weaknesses Behavioral/Psych: Mood is  stable, no new changes  Extremities: No lower extremity edema Breast: Recent left lumpectomy All other systems were reviewed with the patient and are negative.  I have reviewed the past medical history, past surgical history, social history and family history with the patient and they are unchanged from previous note.  ALLERGIES:  is allergic to codeine and ultram [tramadol].  MEDICATIONS:  Current Outpatient Prescriptions  Medication Sig Dispense Refill  . Acetaminophen (APAP ARTHRITIS PO) Take 2 tablets by mouth as needed.    Marland Kitchen amLODipine (NORVASC) 5 MG tablet TAKE 1 TABLET (5 MG TOTAL) BY MOUTH EVERY MORNING. 90 tablet 2  . aspirin 325 MG tablet Take 325 mg by mouth daily.    Marland Kitchen atorvastatin (LIPITOR) 10 MG tablet TAKE 1 TABLET (10 MG TOTAL) BY MOUTH AT BEDTIME. 90 tablet 2  . fluticasone (FLONASE) 50 MCG/ACT nasal spray Place 2 sprays into both nostrils daily. 48 g 3  . HYDROcodone-acetaminophen (NORCO/VICODIN) 5-325 MG tablet Take 1-2 tablets by mouth every 4 (four) hours as needed for moderate pain or severe pain. 10 tablet 0  . LINZESS 72 MCG capsule TAKE 1 CAPSULE (72 MCG TOTAL) BY MOUTH DAILY BEFORE BREAKFAST. 30 capsule 0  . methocarbamol (ROBAXIN) 500 MG tablet Take 1 tablet (500 mg total) by mouth every 6 (six) hours as needed for muscle spasms. (Patient not taking: Reported on 09/03/2016) 60 tablet 2  . Multiple Vitamin (MULTIVITAMIN) tablet Take 1 tablet by mouth daily.      Marland Kitchen omega-3 acid ethyl esters (LOVAZA) 1 G capsule TAKE 1 CAPSULE (1 G TOTAL) BY MOUTH 2 (TWO) TIMES DAILY. 180 capsule 2  . pantoprazole (PROTONIX) 40  MG tablet TAKE 1 TABLET (40 MG TOTAL) BY MOUTH DAILY BEFORE BREAKFAST. 90 tablet 2  . ramipril (ALTACE) 10 MG capsule Take 1 capsule (10 mg total) by mouth 2 (two) times daily. 180 capsule 2  . triamterene-hydrochlorothiazide (MAXZIDE-25) 37.5-25 MG tablet TAKE 1 TABLET BY MOUTH EVERY DAY 90 tablet 0   No current facility-administered medications for this visit.      PHYSICAL EXAMINATION: ECOG PERFORMANCE STATUS: 1 - Symptomatic but completely ambulatory  Vitals:   09/03/16 1529  BP: (!) 167/73  Pulse: 76  Resp: 18  Temp: 98.1 F (36.7 C)   Filed Weights   09/03/16 1529  Weight: 168 lb 1.6 oz (76.2 kg)    GENERAL:alert, no distress and comfortable SKIN: skin color, texture, turgor are normal, no rashes or significant lesions EYES: normal, Conjunctiva are pink and non-injected, sclera clear OROPHARYNX:no exudate, no erythema and lips, buccal mucosa, and tongue normal  NECK: supple, thyroid normal size, non-tender, without nodularity LYMPH:  no palpable lymphadenopathy in the cervical, axillary or inguinal LUNGS: clear to auscultation and percussion with normal breathing effort HEART: regular rate & rhythm and no murmurs and no lower extremity edema ABDOMEN:abdomen soft, non-tender and normal bowel sounds MUSCULOSKELETAL:no cyanosis of digits and no clubbing  NEURO: alert & oriented x 3 with fluent speech, no focal motor/sensory deficits EXTREMITIES: No lower extremity edema  LABORATORY DATA:  I have reviewed the data as listed   Chemistry      Component Value Date/Time   NA 144 08/13/2016 1300   K 3.7 08/13/2016 1300   CL 108 08/13/2016 1300   CO2 24 08/13/2016 1300   BUN 17 08/13/2016 1300   CREATININE 1.15 (H) 08/13/2016 1300   CREATININE 0.88 04/23/2016 1156      Component Value Date/Time   CALCIUM 10.1 08/13/2016 1300   ALKPHOS 68 04/23/2016 1156   AST 17 04/23/2016 1156   ALT 12 04/23/2016 1156   BILITOT 0.3 04/23/2016 1156       Lab Results  Component Value Date   WBC 6.9 04/23/2016   HGB 12.1 04/23/2016   HCT 37.3 04/23/2016   MCV 97.6 04/23/2016   PLT 237 04/23/2016   NEUTROABS 4,830 04/23/2016    ASSESSMENT & PLAN:  Breast cancer of upper-outer quadrant of left female breast ([place]) Left Lumpectomy 08/19/16: IDC with DCIS 1.3 cm, DCIS close to 0.1 cm to all margins;ER 100%, PR 100%, Ki-67 5%, HER-2  negative ratio 1.05, T1cNx (Stage 1A)  Pathology counseling: I discussed the final pathology report of the patient provided  a copy of this report. I discussed the margins as well as lymph node surgeries. We also discussed the final staging along with previously performed ER/PR and HER-2/neu testing.  Plan: 1.Adjuvant radiation therapy to be done by Dr. [name] because of close margins for DCIS followed by 2. Adj anti estrogen therapy with Letrozole  Letrozole counseling:We discussed the risks and benefits of anti-estrogen therapy with aromatase inhibitors. These include but not limited to insomnia, hot flashes, mood changes, vaginal dryness, bone density loss, and weight gain. We strongly believe that the benefits far outweigh the risks. Patient understands these risks and consented to starting treatment. Planned treatment duration is 5 years.  Patient is undecided about whether or not she wants to take antiestrogen therapy. I'll see her back on the last of radiation so that we can make a decision regarding antiestrogen therapy.  No orders of the defined types were placed in this encounter.  The patient  has a good understanding of the overall plan. she agrees with it. she will call with any problems that may develop before the next visit here.   Rulon Eisenmenger, MD 09/03/16

## 2016-09-03 NOTE — Progress Notes (Signed)
Please see the Nurse Progress Note in the MD Initial Consult Encounter for this patient. 

## 2016-09-10 ENCOUNTER — Ambulatory Visit
Admission: RE | Admit: 2016-09-10 | Discharge: 2016-09-10 | Disposition: A | Discharge status: Home / Self Care | Payer: Medicare Other | Source: Ambulatory Visit | Attending: Radiation Oncology | Admitting: Radiation Oncology

## 2016-09-10 DIAGNOSIS — Z510 Encounter for antineoplastic radiation therapy: Secondary | ICD-10-CM | POA: Diagnosis not present

## 2016-09-10 DIAGNOSIS — Z170 Estrogen receptor positive status [ER+]: Principal | ICD-10-CM

## 2016-09-10 DIAGNOSIS — C50412 Malignant neoplasm of upper-outer quadrant of left female breast: Secondary | ICD-10-CM

## 2016-09-10 NOTE — Progress Notes (Signed)
  Radiation Oncology         (336) [phone] ________________________________  Name: SUJEILY BROSCHART MRN: [id number]  Date: 09/10/2016  DOB: 02/18/1928  Optical Surface Tracking Plan:  Since intensity modulated radiotherapy (IMRT) and 3D conformal radiation treatment methods are predicated on accurate and precise positioning for treatment, intrafraction motion monitoring is medically necessary to ensure accurate and safe treatment delivery.  The ability to quantify intrafraction motion without excessive ionizing radiation dose can only be performed with optical surface tracking. Accordingly, surface imaging offers the opportunity to obtain 3D measurements of patient position throughout IMRT and 3D treatments without excessive radiation exposure.  I am ordering optical surface tracking for this patient's upcoming course of radiotherapy. ________________________________  Kyung Rudd, MD 09/10/2016 1:14 PM    Reference:   Ursula Alert, J, et al. Surface imaging-based analysis of intrafraction motion for breast radiotherapy patients.Journal of [place], n. 6, nov. 2014. ISSN [id number].   Available at: <http://www.jacmp.org/index.php/jacmp/article/view/4957>.

## 2016-09-10 NOTE — Progress Notes (Signed)
  Radiation Oncology         [phone] ________________________________  Name: Dawn Church MRN: [id number]  Date: 09/10/2016  DOB: Sep 18, 1927   DIAGNOSIS:     ICD-9-CM ICD-10-CM   1. Malignant neoplasm of upper-outer quadrant of left breast in female, estrogen receptor positive ([place]) 174.4 C50.412    V86.0 Z17.0     SIMULATION AND TREATMENT PLANNING NOTE  The patient presented for simulation prior to beginning her course of radiation treatment for her diagnosis of left-sided breast cancer. The patient was placed in a supine position on a breast board. A customized vac-lock bag was constructed and this complex treatment device will be used on a daily basis during her treatment. In this fashion, a CT scan was obtained through the chest area and an isocenter was placed near the chest wall within the breast.  The patient will be planned to receive a course of radiation initially to a dose of 42.5 Gy. This will consist of a whole breast radiotherapy technique. To accomplish this, 2 customized blocks have been designed which will correspond to medial and lateral whole breast tangent fields. This treatment will be accomplished at 2.5 Gy per fraction. A forward planning technique will also be evaluated to determine if this approach improves the plan. It is anticipated that the patient will then receive a 10 Gy boost to the seroma cavity which has been contoured. This will be accomplished at 2.5 Gy per fraction.   This initial treatment will consist of a 3-D conformal technique. The seroma has been contoured as the primary target structure. Additionally, dose volume histograms of both this target as well as the lungs and heart will also be evaluated. Such an approach is necessary to ensure that the target area is adequately covered while the nearby critical  normal structures are adequately spared.  Plan:  The final anticipated total dose therefore will correspond to 52.5  Gy.    _______________________________   Jodelle Gross, MD, PhD

## 2016-09-11 DIAGNOSIS — Z510 Encounter for antineoplastic radiation therapy: Secondary | ICD-10-CM | POA: Diagnosis not present

## 2016-09-12 ENCOUNTER — Other Ambulatory Visit: Payer: Self-pay | Admitting: Family Medicine

## 2016-09-17 ENCOUNTER — Ambulatory Visit: Admission: RE | Admit: 2016-09-17 | Payer: Medicare Other | Source: Ambulatory Visit

## 2016-09-17 ENCOUNTER — Ambulatory Visit: Payer: Medicare Other | Admitting: Radiation Oncology

## 2016-09-18 ENCOUNTER — Ambulatory Visit: Payer: Medicare Other | Admitting: Radiation Oncology

## 2016-09-18 ENCOUNTER — Ambulatory Visit: Admission: RE | Admit: 2016-09-18 | Payer: Medicare Other | Source: Ambulatory Visit

## 2016-09-18 ENCOUNTER — Ambulatory Visit: Payer: Medicare Other

## 2016-09-19 ENCOUNTER — Inpatient Hospital Stay: Admission: RE | Admit: 2016-09-19 | Payer: Self-pay | Source: Ambulatory Visit | Admitting: Radiation Oncology

## 2016-09-19 ENCOUNTER — Ambulatory Visit: Payer: Medicare Other

## 2016-09-20 ENCOUNTER — Telehealth: Payer: Self-pay | Admitting: Hematology and Oncology

## 2016-09-20 ENCOUNTER — Other Ambulatory Visit: Payer: Self-pay | Admitting: Family Medicine

## 2016-09-20 NOTE — Telephone Encounter (Signed)
Spoke with patient re f/u 2/15

## 2016-09-22 ENCOUNTER — Ambulatory Visit: Payer: Medicare Other

## 2016-09-22 ENCOUNTER — Ambulatory Visit
Admission: RE | Admit: 2016-09-22 | Discharge: 2016-09-22 | Disposition: A | Discharge status: Home / Self Care | Payer: Medicare Other | Source: Ambulatory Visit | Attending: Radiation Oncology | Admitting: Radiation Oncology

## 2016-09-22 DIAGNOSIS — Z171 Estrogen receptor negative status [ER-]: Principal | ICD-10-CM

## 2016-09-22 DIAGNOSIS — C50412 Malignant neoplasm of upper-outer quadrant of left female breast: Secondary | ICD-10-CM | POA: Insufficient documentation

## 2016-09-22 DIAGNOSIS — Z510 Encounter for antineoplastic radiation therapy: Secondary | ICD-10-CM | POA: Diagnosis not present

## 2016-09-22 MED ORDER — ALRA NON-METALLIC DEODORANT (RAD-ONC)
1.0000 "application " | Freq: Once | TOPICAL | Status: AC
  Administered 2016-09-22: 1 via TOPICAL

## 2016-09-22 MED ORDER — RADIAPLEXRX EX GEL
Freq: Once | CUTANEOUS | Status: AC
  Administered 2016-09-22: 12:00:00 via TOPICAL

## 2016-09-22 NOTE — Progress Notes (Signed)
Pt education done, my business card, alra , radiaplex gel, radiation therapy and you book, given, discussed side effects, skin irritation,pain, fatigue, and ways to manage them, increase protein in diet, stay hydrated, drink plenty fluids, electric razor, dove soap unscented, luke war showers, radiaplex after rad tx and bedtime apply to breast area,  No under wire bra, teach back gien after reinforcing a couple times  12:22 PM

## 2016-09-23 ENCOUNTER — Ambulatory Visit
Admission: RE | Admit: 2016-09-23 | Discharge: 2016-09-23 | Disposition: A | Discharge status: Home / Self Care | Payer: Medicare Other | Source: Ambulatory Visit | Attending: Radiation Oncology | Admitting: Radiation Oncology

## 2016-09-23 DIAGNOSIS — Z510 Encounter for antineoplastic radiation therapy: Secondary | ICD-10-CM | POA: Diagnosis not present

## 2016-09-24 ENCOUNTER — Ambulatory Visit
Admission: RE | Admit: 2016-09-24 | Discharge: 2016-09-24 | Disposition: A | Discharge status: Home / Self Care | Payer: Medicare Other | Source: Ambulatory Visit | Attending: Radiation Oncology | Admitting: Radiation Oncology

## 2016-09-24 DIAGNOSIS — Z510 Encounter for antineoplastic radiation therapy: Secondary | ICD-10-CM | POA: Diagnosis not present

## 2016-09-25 ENCOUNTER — Ambulatory Visit
Admission: RE | Admit: 2016-09-25 | Discharge: 2016-09-25 | Disposition: A | Discharge status: Home / Self Care | Payer: Medicare Other | Source: Ambulatory Visit | Attending: Radiation Oncology | Admitting: Radiation Oncology

## 2016-09-25 ENCOUNTER — Telehealth: Payer: Self-pay | Admitting: *Deleted

## 2016-09-25 ENCOUNTER — Encounter: Payer: Self-pay | Admitting: Radiation Oncology

## 2016-09-25 DIAGNOSIS — Z510 Encounter for antineoplastic radiation therapy: Secondary | ICD-10-CM | POA: Diagnosis not present

## 2016-09-26 ENCOUNTER — Encounter: Payer: Self-pay | Admitting: Radiation Oncology

## 2016-09-26 ENCOUNTER — Ambulatory Visit
Admission: RE | Admit: 2016-09-26 | Discharge: 2016-09-26 | Disposition: A | Discharge status: Home / Self Care | Payer: Medicare Other | Source: Ambulatory Visit | Attending: Radiation Oncology | Admitting: Radiation Oncology

## 2016-09-26 VITALS — BP 151/73 | HR 69 | Temp 98.2°F | Resp 16 | Wt 170.2 lb

## 2016-09-26 DIAGNOSIS — C50412 Malignant neoplasm of upper-outer quadrant of left female breast: Secondary | ICD-10-CM

## 2016-09-26 DIAGNOSIS — Z510 Encounter for antineoplastic radiation therapy: Secondary | ICD-10-CM | POA: Diagnosis not present

## 2016-09-26 DIAGNOSIS — Z171 Estrogen receptor negative status [ER-]: Principal | ICD-10-CM

## 2016-09-26 NOTE — Progress Notes (Signed)
Weekly rad txs left breast 5/21 completed, slight dryness near outer side of breat,skin intact, using radiaplex bid, no pain, appetite, good, no fatigue, walks with 4 prong cane 11:46 AM BP (!) 151/73 (BP Location: Right Arm, Patient Position: Sitting, Cuff Size: Normal)   Pulse 69   Temp 98.2 F (36.8 C) (Oral)   Resp 16   Wt 170 lb 3.2 oz (77.2 kg)   LMP  (LMP Unknown)   BMI 30.15 kg/m   Wt Readings from Last 3 Encounters:  09/26/16 170 lb 3.2 oz (77.2 kg)  09/03/16 168 lb (76.2 kg)  09/03/16 168 lb 1.6 oz (76.2 kg)

## 2016-09-26 NOTE — Progress Notes (Signed)
Department of Radiation Oncology  Phone:  [phone] Fax:        [phone]  Weekly Treatment Note    Name: Dawn Church Date: 09/26/2016 MRN: [id number] DOB: Aug 07, 1928   Diagnosis:     ICD-9-CM ICD-10-CM   1. Malignant neoplasm of upper-outer quadrant of left breast in female, estrogen receptor negative ([place]) 174.4 C50.412    V86.1 Z17.1      Current dose: 12.5 Gy  Current fraction: 5   MEDICATIONS: Current Outpatient Prescriptions  Medication Sig Dispense Refill  . Acetaminophen (APAP ARTHRITIS PO) Take 2 tablets by mouth as needed.    Marland Kitchen amLODipine (NORVASC) 5 MG tablet TAKE 1 TABLET (5 MG TOTAL) BY MOUTH EVERY MORNING. 90 tablet 2  . aspirin 325 MG tablet Take 325 mg by mouth daily.    Marland Kitchen atorvastatin (LIPITOR) 10 MG tablet TAKE 1 TABLET (10 MG TOTAL) BY MOUTH AT BEDTIME. 90 tablet 2  . fluticasone (FLONASE) 50 MCG/ACT nasal spray Place 2 sprays into both nostrils daily. 48 g 3  . hyaluronate sodium (RADIAPLEXRX) GEL Apply 1 application topically 2 (two) times daily.    Marland Kitchen HYDROcodone-acetaminophen (NORCO/VICODIN) 5-325 MG tablet Take 1-2 tablets by mouth every 4 (four) hours as needed for moderate pain or severe pain. 10 tablet 0  . LINZESS 72 MCG capsule TAKE 1 CAPSULE (72 MCG TOTAL) BY MOUTH DAILY BEFORE BREAKFAST. 30 capsule 0  . Multiple Vitamin (MULTIVITAMIN) tablet Take 1 tablet by mouth daily.      . non-metallic deodorant Jethro Poling) MISC Apply 1 application topically daily as needed.    Marland Kitchen omega-3 acid ethyl esters (LOVAZA) 1 G capsule TAKE 1 CAPSULE (1 G TOTAL) BY MOUTH 2 (TWO) TIMES DAILY. 180 capsule 2  . omega-3 acid ethyl esters (LOVAZA) 1 g capsule TAKE 1 CAPSULE (1 G TOTAL) BY MOUTH 2 (TWO) TIMES DAILY. 180 capsule 1  . pantoprazole (PROTONIX) 40 MG tablet TAKE 1 TABLET (40 MG TOTAL) BY MOUTH DAILY BEFORE BREAKFAST. 90 tablet 2  . ramipril (ALTACE) 10 MG capsule Take 1 capsule (10 mg total) by mouth 2 (two) times daily. 180 capsule 2  .  triamterene-hydrochlorothiazide (MAXZIDE-25) 37.5-25 MG tablet TAKE 1 TABLET BY MOUTH EVERY DAY 90 tablet 0  . methocarbamol (ROBAXIN) 500 MG tablet Take 1 tablet (500 mg total) by mouth every 6 (six) hours as needed for muscle spasms. (Patient not taking: Reported on 09/03/2016) 60 tablet 2   No current facility-administered medications for this encounter.      ALLERGIES: Codeine and Ultram [tramadol]   LABORATORY DATA:  Lab Results  Component Value Date   WBC 6.9 04/23/2016   HGB 12.1 04/23/2016   HCT 37.3 04/23/2016   MCV 97.6 04/23/2016   PLT 237 04/23/2016   Lab Results  Component Value Date   NA 144 08/13/2016   K 3.7 08/13/2016   CL 108 08/13/2016   CO2 24 08/13/2016   Lab Results  Component Value Date   ALT 12 04/23/2016   AST 17 04/23/2016   ALKPHOS 68 04/23/2016   BILITOT 0.3 04/23/2016     NARRATIVE: Dawn Church was seen today for weekly treatment management. The chart was checked and the patient's films were reviewed.  The patient states that she is doing very well after her first week of treatment. No unexpected difficulties. The patient is using skin cream daily.  PHYSICAL EXAMINATION: weight is 170 lb 3.2 oz (77.2 kg). Her oral temperature is 98.2 F (36.8 C). Her blood  pressure is 151/73 (abnormal) and her pulse is 69. Her respiration is 16.        ASSESSMENT: The patient is doing satisfactorily with treatment.  PLAN: We will continue with the patient's radiation treatment as planned.

## 2016-09-29 ENCOUNTER — Ambulatory Visit
Admission: RE | Admit: 2016-09-29 | Discharge: 2016-09-29 | Disposition: A | Discharge status: Home / Self Care | Payer: Medicare Other | Source: Ambulatory Visit | Attending: Radiation Oncology | Admitting: Radiation Oncology

## 2016-09-29 DIAGNOSIS — Z510 Encounter for antineoplastic radiation therapy: Secondary | ICD-10-CM | POA: Diagnosis not present

## 2016-09-29 NOTE — Telephone Encounter (Signed)
Left vm to assess needs during xrt. Contact information provided.

## 2016-09-30 ENCOUNTER — Ambulatory Visit
Admission: RE | Admit: 2016-09-30 | Discharge: 2016-09-30 | Disposition: A | Discharge status: Home / Self Care | Payer: Medicare Other | Source: Ambulatory Visit | Attending: Radiation Oncology | Admitting: Radiation Oncology

## 2016-09-30 DIAGNOSIS — Z510 Encounter for antineoplastic radiation therapy: Secondary | ICD-10-CM | POA: Diagnosis not present

## 2016-10-01 ENCOUNTER — Ambulatory Visit
Admission: RE | Admit: 2016-10-01 | Discharge: 2016-10-01 | Disposition: A | Discharge status: Home / Self Care | Payer: Medicare Other | Source: Ambulatory Visit | Attending: Radiation Oncology | Admitting: Radiation Oncology

## 2016-10-01 DIAGNOSIS — Z510 Encounter for antineoplastic radiation therapy: Secondary | ICD-10-CM | POA: Diagnosis not present

## 2016-10-02 ENCOUNTER — Ambulatory Visit
Admission: RE | Admit: 2016-10-02 | Discharge: 2016-10-02 | Disposition: A | Discharge status: Home / Self Care | Payer: Medicare Other | Source: Ambulatory Visit | Attending: Radiation Oncology | Admitting: Radiation Oncology

## 2016-10-02 DIAGNOSIS — Z510 Encounter for antineoplastic radiation therapy: Secondary | ICD-10-CM | POA: Diagnosis not present

## 2016-10-03 ENCOUNTER — Ambulatory Visit
Admission: RE | Admit: 2016-10-03 | Discharge: 2016-10-03 | Disposition: A | Discharge status: Home / Self Care | Payer: Medicare Other | Source: Ambulatory Visit | Attending: Radiation Oncology | Admitting: Radiation Oncology

## 2016-10-03 ENCOUNTER — Encounter: Payer: Self-pay | Admitting: Radiation Oncology

## 2016-10-03 ENCOUNTER — Ambulatory Visit: Payer: Medicare Other | Admitting: Radiation Oncology

## 2016-10-03 VITALS — BP 160/70 | HR 75 | Temp 97.8°F | Wt 170.8 lb

## 2016-10-03 DIAGNOSIS — C50412 Malignant neoplasm of upper-outer quadrant of left female breast: Secondary | ICD-10-CM

## 2016-10-03 DIAGNOSIS — Z510 Encounter for antineoplastic radiation therapy: Secondary | ICD-10-CM | POA: Diagnosis not present

## 2016-10-03 DIAGNOSIS — Z171 Estrogen receptor negative status [ER-]: Principal | ICD-10-CM

## 2016-10-03 NOTE — Progress Notes (Signed)
Weekly rad txs left breast 10/21 completed, mild tanning, skin intact,  Uses radiaplex bid, appetite good, mild fatigue 11:26 AM BP (!) 160/70 (BP Location: Right Arm, Patient Position: Sitting, Cuff Size: Normal)   Pulse 75   Temp 97.8 F (36.6 C) (Oral)   Wt 170 lb 12.8 oz (77.5 kg)   LMP  (LMP Unknown)   BMI 30.26 kg/m   Wt Readings from Last 3 Encounters:  10/03/16 170 lb 12.8 oz (77.5 kg)  09/26/16 170 lb 3.2 oz (77.2 kg)  09/03/16 168 lb (76.2 kg)

## 2016-10-03 NOTE — Progress Notes (Signed)
Department of Radiation Oncology  Phone:  [phone] Fax:        [phone]  Weekly Treatment Note    Name: Dawn Church Date: 10/03/2016 MRN: [id number] DOB: Oct 12, 1927   Diagnosis:     ICD-9-CM ICD-10-CM   1. Malignant neoplasm of upper-outer quadrant of left breast in female, estrogen receptor negative ([place]) 174.4 C50.412    V86.1 Z17.1      Current dose: 25 Gy  Current fraction: 10   MEDICATIONS: Current Outpatient Prescriptions  Medication Sig Dispense Refill  . Acetaminophen (APAP ARTHRITIS PO) Take 2 tablets by mouth as needed.    Marland Kitchen amLODipine (NORVASC) 5 MG tablet TAKE 1 TABLET (5 MG TOTAL) BY MOUTH EVERY MORNING. 90 tablet 2  . aspirin 325 MG tablet Take 325 mg by mouth daily.    Marland Kitchen atorvastatin (LIPITOR) 10 MG tablet TAKE 1 TABLET (10 MG TOTAL) BY MOUTH AT BEDTIME. 90 tablet 2  . fluticasone (FLONASE) 50 MCG/ACT nasal spray Place 2 sprays into both nostrils daily. 48 g 3  . hyaluronate sodium (RADIAPLEXRX) GEL Apply 1 application topically 2 (two) times daily.    Marland Kitchen HYDROcodone-acetaminophen (NORCO/VICODIN) 5-325 MG tablet Take 1-2 tablets by mouth every 4 (four) hours as needed for moderate pain or severe pain. 10 tablet 0  . LINZESS 72 MCG capsule TAKE 1 CAPSULE (72 MCG TOTAL) BY MOUTH DAILY BEFORE BREAKFAST. 30 capsule 0  . Multiple Vitamin (MULTIVITAMIN) tablet Take 1 tablet by mouth daily.      . non-metallic deodorant Jethro Poling) MISC Apply 1 application topically daily as needed.    Marland Kitchen omega-3 acid ethyl esters (LOVAZA) 1 G capsule TAKE 1 CAPSULE (1 G TOTAL) BY MOUTH 2 (TWO) TIMES DAILY. 180 capsule 2  . omega-3 acid ethyl esters (LOVAZA) 1 g capsule TAKE 1 CAPSULE (1 G TOTAL) BY MOUTH 2 (TWO) TIMES DAILY. 180 capsule 1  . pantoprazole (PROTONIX) 40 MG tablet TAKE 1 TABLET (40 MG TOTAL) BY MOUTH DAILY BEFORE BREAKFAST. 90 tablet 2  . ramipril (ALTACE) 10 MG capsule Take 1 capsule (10 mg total) by mouth 2 (two) times daily. 180 capsule 2  .  triamterene-hydrochlorothiazide (MAXZIDE-25) 37.5-25 MG tablet TAKE 1 TABLET BY MOUTH EVERY DAY 90 tablet 0  . methocarbamol (ROBAXIN) 500 MG tablet Take 1 tablet (500 mg total) by mouth every 6 (six) hours as needed for muscle spasms. (Patient not taking: Reported on 09/03/2016) 60 tablet 2   No current facility-administered medications for this encounter.      ALLERGIES: Codeine and Ultram [tramadol]   LABORATORY DATA:  Lab Results  Component Value Date   WBC 6.9 04/23/2016   HGB 12.1 04/23/2016   HCT 37.3 04/23/2016   MCV 97.6 04/23/2016   PLT 237 04/23/2016   Lab Results  Component Value Date   NA 144 08/13/2016   K 3.7 08/13/2016   CL 108 08/13/2016   CO2 24 08/13/2016   Lab Results  Component Value Date   ALT 12 04/23/2016   AST 17 04/23/2016   ALKPHOS 68 04/23/2016   BILITOT 0.3 04/23/2016     NARRATIVE: Dawn Church was seen today for weekly treatment management. The chart was checked and the patient's films were reviewed.  The patient is doing well this week. She states she has not noticed any change in her scan.  Weekly rad txs left breast 10/21 completed, mild tanning, skin intact,  Uses radiaplex bid, appetite good, mild fatigue 11:37 AM BP (!) 160/70 (BP Location: Right  Arm, Patient Position: Sitting, Cuff Size: Normal)   Pulse 75   Temp 97.8 F (36.6 C) (Oral)   Wt 170 lb 12.8 oz (77.5 kg)   LMP  (LMP Unknown)   BMI 30.26 kg/m   Wt Readings from Last 3 Encounters:  10/03/16 170 lb 12.8 oz (77.5 kg)  09/26/16 170 lb 3.2 oz (77.2 kg)  09/03/16 168 lb (76.2 kg)    PHYSICAL EXAMINATION: weight is 170 lb 12.8 oz (77.5 kg). Her oral temperature is 97.8 F (36.6 C). Her blood pressure is 160/70 (abnormal) and her pulse is 75.      Mild hyperpigmentation present in the treatment area  ASSESSMENT: The patient is doing satisfactorily with treatment.  PLAN: We will continue with the patient's radiation treatment as planned.

## 2016-10-06 ENCOUNTER — Ambulatory Visit
Admission: RE | Admit: 2016-10-06 | Discharge: 2016-10-06 | Disposition: A | Discharge status: Home / Self Care | Payer: Medicare Other | Source: Ambulatory Visit | Attending: Radiation Oncology | Admitting: Radiation Oncology

## 2016-10-06 DIAGNOSIS — Z510 Encounter for antineoplastic radiation therapy: Secondary | ICD-10-CM | POA: Diagnosis not present

## 2016-10-07 ENCOUNTER — Ambulatory Visit
Admission: RE | Admit: 2016-10-07 | Discharge: 2016-10-07 | Disposition: A | Discharge status: Home / Self Care | Payer: Medicare Other | Source: Ambulatory Visit | Attending: Radiation Oncology | Admitting: Radiation Oncology

## 2016-10-07 DIAGNOSIS — Z510 Encounter for antineoplastic radiation therapy: Secondary | ICD-10-CM | POA: Diagnosis not present

## 2016-10-08 ENCOUNTER — Ambulatory Visit
Admission: RE | Admit: 2016-10-08 | Discharge: 2016-10-08 | Disposition: A | Discharge status: Home / Self Care | Payer: Medicare Other | Source: Ambulatory Visit | Attending: Radiation Oncology | Admitting: Radiation Oncology

## 2016-10-08 DIAGNOSIS — Z510 Encounter for antineoplastic radiation therapy: Secondary | ICD-10-CM | POA: Diagnosis not present

## 2016-10-09 ENCOUNTER — Ambulatory Visit
Admission: RE | Admit: 2016-10-09 | Discharge: 2016-10-09 | Disposition: A | Discharge status: Home / Self Care | Payer: Medicare Other | Source: Ambulatory Visit | Attending: Radiation Oncology | Admitting: Radiation Oncology

## 2016-10-09 DIAGNOSIS — Z510 Encounter for antineoplastic radiation therapy: Secondary | ICD-10-CM | POA: Diagnosis not present

## 2016-10-10 ENCOUNTER — Encounter: Payer: Self-pay | Admitting: Radiation Oncology

## 2016-10-10 ENCOUNTER — Ambulatory Visit
Admission: RE | Admit: 2016-10-10 | Discharge: 2016-10-10 | Disposition: A | Discharge status: Home / Self Care | Payer: Medicare Other | Source: Ambulatory Visit | Attending: Radiation Oncology | Admitting: Radiation Oncology

## 2016-10-10 VITALS — BP 140/65 | HR 82 | Temp 98.3°F | Resp 20 | Wt 170.0 lb

## 2016-10-10 DIAGNOSIS — C50412 Malignant neoplasm of upper-outer quadrant of left female breast: Secondary | ICD-10-CM

## 2016-10-10 DIAGNOSIS — Z510 Encounter for antineoplastic radiation therapy: Secondary | ICD-10-CM | POA: Diagnosis not present

## 2016-10-10 DIAGNOSIS — Z171 Estrogen receptor negative status [ER-]: Principal | ICD-10-CM

## 2016-10-10 NOTE — Progress Notes (Signed)
Weekly rad txs left breast,15/21 completed,  mild tanning, uses radiaplex bid, skin intact, no pain, appetite good 11:31 AM BP 140/65 (BP Location: Right Arm, Patient Position: Sitting, Cuff Size: Normal)   Pulse 82   Temp 98.3 F (36.8 C) (Oral)   Resp 20   Wt 170 lb (77.1 kg)   LMP  (LMP Unknown)   BMI 30.11 kg/m   Wt Readings from Last 3 Encounters:  10/10/16 170 lb (77.1 kg)  10/03/16 170 lb 12.8 oz (77.5 kg)  09/26/16 170 lb 3.2 oz (77.2 kg)

## 2016-10-10 NOTE — Progress Notes (Signed)
Complex simulation note  Diagnosis: left-sided breast cancer  Narrative The patient has initially been planned to receive a course of whole breast radiation to a dose of 42.5 Gy in 17 fractions. The patient will now receive an additional boost to the seroma cavity which has been contoured. This will correspond to a boost of 10 Gy at 2.5 Gy per fraction. To accomplish this, an additional 3 customized blocks have been designed for this purpose. A complex isodose plan is requested to ensure that the target area is adequately covered with radiation dose and that the nearby normal structures such as the lung are adequately spared. The patient's final total dose will be 52.5 Gy.  ------------------------------------------------  Jodelle Gross, MD, PhD

## 2016-10-10 NOTE — Progress Notes (Signed)
Department of Radiation Oncology  Phone:  [phone] Fax:        [phone]  Weekly Treatment Note    Name: Dawn Church Date: 10/10/2016 MRN: [id number] DOB: [birth year]   Diagnosis:     ICD-9-CM ICD-10-CM   1. Malignant neoplasm of upper-outer quadrant of left breast in female, estrogen receptor negative ([place]) 174.4 C50.412    V86.1 Z17.1      Current dose: 37.5 Gy  Current fraction: 15   MEDICATIONS: Current Outpatient Prescriptions  Medication Sig Dispense Refill  . Acetaminophen (APAP ARTHRITIS PO) Take 2 tablets by mouth as needed.    Marland Kitchen amLODipine (NORVASC) 5 MG tablet TAKE 1 TABLET (5 MG TOTAL) BY MOUTH EVERY MORNING. 90 tablet 2  . aspirin 325 MG tablet Take 325 mg by mouth daily.    Marland Kitchen atorvastatin (LIPITOR) 10 MG tablet TAKE 1 TABLET (10 MG TOTAL) BY MOUTH AT BEDTIME. 90 tablet 2  . fluticasone (FLONASE) 50 MCG/ACT nasal spray Place 2 sprays into both nostrils daily. 48 g 3  . hyaluronate sodium (RADIAPLEXRX) GEL Apply 1 application topically 2 (two) times daily.    Marland Kitchen HYDROcodone-acetaminophen (NORCO/VICODIN) 5-325 MG tablet Take 1-2 tablets by mouth every 4 (four) hours as needed for moderate pain or severe pain. 10 tablet 0  . LINZESS 72 MCG capsule TAKE 1 CAPSULE (72 MCG TOTAL) BY MOUTH DAILY BEFORE BREAKFAST. 30 capsule 0  . Multiple Vitamin (MULTIVITAMIN) tablet Take 1 tablet by mouth daily.      . non-metallic deodorant Jethro Poling) MISC Apply 1 application topically daily as needed.    Marland Kitchen omega-3 acid ethyl esters (LOVAZA) 1 G capsule TAKE 1 CAPSULE (1 G TOTAL) BY MOUTH 2 (TWO) TIMES DAILY. 180 capsule 2  . omega-3 acid ethyl esters (LOVAZA) 1 g capsule TAKE 1 CAPSULE (1 G TOTAL) BY MOUTH 2 (TWO) TIMES DAILY. 180 capsule 1  . pantoprazole (PROTONIX) 40 MG tablet TAKE 1 TABLET (40 MG TOTAL) BY MOUTH DAILY BEFORE BREAKFAST. 90 tablet 2  . ramipril (ALTACE) 10 MG capsule Take 1 capsule (10 mg total) by mouth 2 (two) times daily. 180 capsule 2  .  triamterene-hydrochlorothiazide (MAXZIDE-25) 37.5-25 MG tablet TAKE 1 TABLET BY MOUTH EVERY DAY 90 tablet 0  . methocarbamol (ROBAXIN) 500 MG tablet Take 1 tablet (500 mg total) by mouth every 6 (six) hours as needed for muscle spasms. (Patient not taking: Reported on 10/10/2016) 60 tablet 2   No current facility-administered medications for this encounter.      ALLERGIES: Codeine and Ultram [tramadol]   LABORATORY DATA:  Lab Results  Component Value Date   WBC 6.9 04/23/2016   HGB 12.1 04/23/2016   HCT 37.3 04/23/2016   MCV 97.6 04/23/2016   PLT 237 04/23/2016   Lab Results  Component Value Date   NA 144 08/13/2016   K 3.7 08/13/2016   CL 108 08/13/2016   CO2 24 08/13/2016   Lab Results  Component Value Date   ALT 12 04/23/2016   AST 17 04/23/2016   ALKPHOS 68 04/23/2016   BILITOT 0.3 04/23/2016     NARRATIVE: Dawn Church was seen today for weekly treatment management. The chart was checked and the patient's films were reviewed.  The patient states that she is doing very well this week. Continuing to do so. No major issues.  Weekly rad txs left breast,15/21 completed,  mild tanning, uses radiaplex bid, skin intact, no pain, appetite good 11:39 AM BP 140/65 (BP Location: Right Arm, Patient  Position: Sitting, Cuff Size: Normal)   Pulse 82   Temp 98.3 F (36.8 C) (Oral)   Resp 20   Wt 170 lb (77.1 kg)   LMP  (LMP Unknown)   BMI 30.11 kg/m   Wt Readings from Last 3 Encounters:  10/10/16 170 lb (77.1 kg)  10/03/16 170 lb 12.8 oz (77.5 kg)  09/26/16 170 lb 3.2 oz (77.2 kg)    PHYSICAL EXAMINATION: weight is 170 lb (77.1 kg). Her oral temperature is 98.3 F (36.8 C). Her blood pressure is 140/65 and her pulse is 82. Her respiration is 20.      Mild hyperpigmentation in the treatment area. No significant desquamation.  ASSESSMENT: The patient is doing satisfactorily with treatment.  PLAN: We will continue with the patient's radiation treatment as planned.

## 2016-10-13 ENCOUNTER — Ambulatory Visit
Admission: RE | Admit: 2016-10-13 | Discharge: 2016-10-13 | Disposition: A | Discharge status: Home / Self Care | Payer: Medicare Other | Source: Ambulatory Visit | Attending: Radiation Oncology | Admitting: Radiation Oncology

## 2016-10-13 ENCOUNTER — Other Ambulatory Visit: Payer: Self-pay | Admitting: Family Medicine

## 2016-10-13 DIAGNOSIS — Z510 Encounter for antineoplastic radiation therapy: Secondary | ICD-10-CM | POA: Diagnosis not present

## 2016-10-14 ENCOUNTER — Ambulatory Visit
Admission: RE | Admit: 2016-10-14 | Discharge: 2016-10-14 | Disposition: A | Discharge status: Home / Self Care | Payer: Medicare Other | Source: Ambulatory Visit | Attending: Radiation Oncology | Admitting: Radiation Oncology

## 2016-10-14 DIAGNOSIS — Z510 Encounter for antineoplastic radiation therapy: Secondary | ICD-10-CM | POA: Diagnosis not present

## 2016-10-15 ENCOUNTER — Ambulatory Visit
Admission: RE | Admit: 2016-10-15 | Discharge: 2016-10-15 | Disposition: A | Discharge status: Home / Self Care | Payer: Medicare Other | Source: Ambulatory Visit | Attending: Radiation Oncology | Admitting: Radiation Oncology

## 2016-10-15 ENCOUNTER — Ambulatory Visit: Payer: Medicare Other

## 2016-10-15 DIAGNOSIS — Z510 Encounter for antineoplastic radiation therapy: Secondary | ICD-10-CM | POA: Diagnosis not present

## 2016-10-15 NOTE — Assessment & Plan Note (Signed)
Left Lumpectomy 08/19/16: IDC with DCIS 1.3 cm, DCIS close to 0.1 cm to all margins;ER 100%, PR 100%, Ki-67 5%, HER-2 negative ratio 1.05, T1cNx (Stage 1A)  Pathology counseling: I discussed the final pathology report of the patient provided  a copy of this report. I discussed the margins as well as lymph node surgeries. We also discussed the final staging along with previously performed ER/PR and HER-2/neu testing. Adj XRT completed 10/16/16  Plan: 1. Adj anti estrogen therapy with Letrozole   RTC in 3 months for tox check

## 2016-10-16 ENCOUNTER — Ambulatory Visit: Payer: Medicare Other

## 2016-10-16 ENCOUNTER — Ambulatory Visit
Admission: RE | Admit: 2016-10-16 | Discharge: 2016-10-16 | Disposition: A | Discharge status: Home / Self Care | Payer: Medicare Other | Source: Ambulatory Visit | Attending: Radiation Oncology | Admitting: Radiation Oncology

## 2016-10-16 ENCOUNTER — Encounter: Payer: Self-pay | Admitting: Hematology and Oncology

## 2016-10-16 ENCOUNTER — Ambulatory Visit (HOSPITAL_BASED_OUTPATIENT_CLINIC_OR_DEPARTMENT_OTHER): Payer: Medicare Other | Admitting: Hematology and Oncology

## 2016-10-16 VITALS — BP 139/62 | HR 73 | Temp 98.6°F | Resp 18 | Ht 63.0 in | Wt 169.8 lb

## 2016-10-16 DIAGNOSIS — Z170 Estrogen receptor positive status [ER+]: Secondary | ICD-10-CM | POA: Diagnosis not present

## 2016-10-16 DIAGNOSIS — Z510 Encounter for antineoplastic radiation therapy: Secondary | ICD-10-CM | POA: Diagnosis not present

## 2016-10-16 DIAGNOSIS — C50412 Malignant neoplasm of upper-outer quadrant of left female breast: Secondary | ICD-10-CM

## 2016-10-16 DIAGNOSIS — Z780 Asymptomatic menopausal state: Secondary | ICD-10-CM

## 2016-10-16 NOTE — Progress Notes (Signed)
Patient Care Team: Alycia Rossetti, MD as PCP - General (Family Medicine)  DIAGNOSIS:  Encounter Diagnoses  Name Primary?  . Malignant neoplasm of upper-outer quadrant of left breast in female, estrogen receptor positive ([place])   . Post-menopausal Yes    SUMMARY OF ONCOLOGIC HISTORY:   Breast cancer of upper-outer quadrant of left female breast ([place])   06/10/2016 Initial Diagnosis    Left breast biopsy 2:00 position: Grade 2 IDC with DCIS, ER 100%, PR 100%, Ki-67 5%, HER-2 negative ratio 1.05, 1.2 cm irregular mass left breast 2:00 position 8 cm from nipple, T1c N0 stage IA clinical stage      08/19/2016 Surgery    Left Lumpectomy: IDC with DCIS 1.3 cm, DCIS close to 0.1 cm to all margins;ER 100%, PR 100%, Ki-67 5%, HER-2 negative ratio 1.05, T1cNx (Stage 1A)      09/17/2016 - 10/15/2016 Radiation Therapy    Adj XRT       CHIEF COMPLIANT: Follow-up of adjuvant radiation  INTERVAL HISTORY: Dawn Church is a 83 year old with above-mentioned history of left breast cancer treated with lumpectomy and adjuvant radiation is here today to discuss the role of antiestrogen therapy. She tolerated radiation fairly well. She does have some radiation dermatitis.  REVIEW OF SYSTEMS:   Constitutional: Denies fevers, chills or abnormal weight loss Eyes: Denies blurriness of vision Ears, nose, mouth, throat, and face: Denies mucositis or sore throat Respiratory: Denies cough, dyspnea or wheezes Cardiovascular: Denies palpitation, chest discomfort Gastrointestinal:  Denies nausea, heartburn or change in bowel habits Skin: Denies abnormal skin rashes Lymphatics: Denies new lymphadenopathy or easy bruising Neurological:Denies numbness, tingling or new weaknesses Behavioral/Psych: Mood is stable, no new changes  Extremities: No lower extremity edema Breast: 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  I have reviewed the past  medical history, past surgical history, social history and family history with the patient and they are unchanged from previous note.  ALLERGIES:  is allergic to codeine and ultram [tramadol].  MEDICATIONS:  Current Outpatient Prescriptions  Medication Sig Dispense Refill  . Acetaminophen (APAP ARTHRITIS PO) Take 2 tablets by mouth as needed.    Marland Kitchen amLODipine (NORVASC) 5 MG tablet TAKE 1 TABLET (5 MG TOTAL) BY MOUTH EVERY MORNING. 90 tablet 2  . aspirin 325 MG tablet Take 325 mg by mouth daily.    Marland Kitchen atorvastatin (LIPITOR) 10 MG tablet TAKE 1 TABLET (10 MG TOTAL) BY MOUTH AT BEDTIME. 90 tablet 2  . fluticasone (FLONASE) 50 MCG/ACT nasal spray Place 2 sprays into both nostrils daily. 48 g 3  . hyaluronate sodium (RADIAPLEXRX) GEL Apply 1 application topically 2 (two) times daily.    Marland Kitchen LINZESS 72 MCG capsule TAKE 1 CAPSULE (72 MCG TOTAL) BY MOUTH DAILY BEFORE BREAKFAST. 30 capsule 0  . Multiple Vitamin (MULTIVITAMIN) tablet Take 1 tablet by mouth daily.      . non-metallic deodorant Jethro Poling) MISC Apply 1 application topically daily as needed.    Marland Kitchen omega-3 acid ethyl esters (LOVAZA) 1 g capsule TAKE 1 CAPSULE (1 G TOTAL) BY MOUTH 2 (TWO) TIMES DAILY. 180 capsule 1  . pantoprazole (PROTONIX) 40 MG tablet TAKE 1 TABLET (40 MG TOTAL) BY MOUTH DAILY BEFORE BREAKFAST. 90 tablet 2  . ramipril (ALTACE) 10 MG capsule Take 1 capsule (10 mg total) by mouth 2 (two) times daily. 180 capsule 2  . triamterene-hydrochlorothiazide (MAXZIDE-25) 37.5-25 MG tablet TAKE 1 TABLET BY MOUTH EVERY DAY 90 tablet  0   No current facility-administered medications for this visit.     PHYSICAL EXAMINATION: ECOG PERFORMANCE STATUS: 1 - Symptomatic but completely ambulatory  Vitals:   10/16/16 1150  BP: 139/62  Pulse: 73  Resp: 18  Temp: 98.6 F (37 C)   Filed Weights   10/16/16 1150  Weight: 169 lb 12.8 oz (77 kg)    GENERAL:alert, no distress and comfortable SKIN: skin color, texture, turgor are normal, no  rashes or significant lesions EYES: normal, Conjunctiva are pink and non-injected, sclera clear OROPHARYNX:no exudate, no erythema and lips, buccal mucosa, and tongue normal  NECK: supple, thyroid normal size, non-tender, without nodularity LYMPH:  no palpable lymphadenopathy in the cervical, axillary or inguinal LUNGS: clear to auscultation and percussion with normal breathing effort HEART: regular rate & rhythm and no murmurs and no lower extremity edema ABDOMEN:abdomen soft, non-tender and normal bowel sounds MUSCULOSKELETAL:no cyanosis of digits and no clubbing  NEURO: alert & oriented x 3 with fluent speech, no focal motor/sensory deficits EXTREMITIES: No lower extremity edema  LABORATORY DATA:  I have reviewed the data as listed   Chemistry      Component Value Date/Time   NA 144 08/13/2016 1300   K 3.7 08/13/2016 1300   CL 108 08/13/2016 1300   CO2 24 08/13/2016 1300   BUN 17 08/13/2016 1300   CREATININE 1.15 (H) 08/13/2016 1300   CREATININE 0.88 04/23/2016 1156      Component Value Date/Time   CALCIUM 10.1 08/13/2016 1300   ALKPHOS 68 04/23/2016 1156   AST 17 04/23/2016 1156   ALT 12 04/23/2016 1156   BILITOT 0.3 04/23/2016 1156       Lab Results  Component Value Date   WBC 6.9 04/23/2016   HGB 12.1 04/23/2016   HCT 37.3 04/23/2016   MCV 97.6 04/23/2016   PLT 237 04/23/2016   NEUTROABS 4,830 04/23/2016    ASSESSMENT & PLAN:  Breast cancer of upper-outer quadrant of left female breast ([place]) Left Lumpectomy 08/19/16: IDC with DCIS 1.3 cm, DCIS close to 0.1 cm to all margins;ER 100%, PR 100%, Ki-67 5%, HER-2 negative ratio 1.05, T1cNx (Stage 1A)  Pathology counseling: I discussed the final pathology report of the patient provided  a copy of this report. I discussed the margins as well as lymph node surgeries. We also discussed the final staging along with previously performed ER/PR and HER-2/neu testing. Adj XRT completed 10/20/16  Plan: 1. Adj anti estrogen  therapy with Letrozole  Letrozole counseling:We discussed the risks and benefits of anti-estrogen therapy with aromatase inhibitors. These include but not limited to insomnia, hot flashes, mood changes, vaginal dryness, bone density loss, and weight gain. We strongly believe that the benefits far outweigh the risks. Patient understands these risks and consented to starting treatment. Planned treatment duration is 5 years.  After listening to the discussion, we decided to do a bone density test. I would like to call her after the bone density test and then send a prescription for the letrozole. If she were to start letrozole she was started on March 20. RTC in 3 months after starting letrozole for tox check   I spent 25 minutes talking to the patient of which more than half was spent in counseling and coordination of care.  Orders Placed This Encounter  Procedures  . DG Bone Density    Standing Status:   Future    Standing Expiration Date:   10/16/2017    Order Specific Question:   Reason  for Exam (SYMPTOM  OR DIAGNOSIS REQUIRED)    Answer:   Post menopausal osteoporosis    Order Specific Question:   Preferred imaging location?    Answer:   [facility]   The patient has a good understanding of the overall plan. she agrees with it. she will call with any problems that may develop before the next visit here.   Rulon Eisenmenger, MD 10/16/16

## 2016-10-17 ENCOUNTER — Ambulatory Visit: Payer: Medicare Other

## 2016-10-17 ENCOUNTER — Encounter: Payer: Self-pay | Admitting: Radiation Oncology

## 2016-10-17 ENCOUNTER — Ambulatory Visit
Admission: RE | Admit: 2016-10-17 | Discharge: 2016-10-17 | Disposition: A | Discharge status: Home / Self Care | Payer: Medicare Other | Source: Ambulatory Visit | Attending: Radiation Oncology | Admitting: Radiation Oncology

## 2016-10-17 VITALS — BP 147/68 | HR 77 | Temp 98.7°F | Resp 20 | Wt 172.6 lb

## 2016-10-17 DIAGNOSIS — Z510 Encounter for antineoplastic radiation therapy: Secondary | ICD-10-CM | POA: Diagnosis not present

## 2016-10-17 DIAGNOSIS — C50412 Malignant neoplasm of upper-outer quadrant of left female breast: Secondary | ICD-10-CM

## 2016-10-17 MED ORDER — RADIAPLEXRX EX GEL
Freq: Once | CUTANEOUS | Status: AC
  Administered 2016-10-17: 12:00:00 via TOPICAL

## 2016-10-17 NOTE — Progress Notes (Addendum)
Weekly rad txs left breast 20/21 completed, mild tanning of breast,skin intact, gave another radiaplex tube,  No c/o pain, appetite good,  11:34 AM Wt Readings from Last 3 Encounters:  10/17/16 172 lb 9.6 oz (78.3 kg)  10/16/16 169 lb 12.8 oz (77 kg)  10/10/16 170 lb (77.1 kg)   BP (!) 147/68 (BP Location: Left Arm, Patient Position: Sitting, Cuff Size: Normal)   Pulse 77   Temp 98.7 F (37.1 C) (Oral)   Resp 20   Wt 172 lb 9.6 oz (78.3 kg)   LMP  (LMP Unknown)   BMI 30.57 kg/m

## 2016-10-18 NOTE — Progress Notes (Signed)
Department of Radiation Oncology  Phone:  [phone] Fax:        [phone]  Weekly Treatment Note    Name: Dawn Church Date: 10/18/2016 MRN: [id number] DOB: 31-Mar-1928   Diagnosis:     ICD-9-CM ICD-10-CM   1. Malignant neoplasm of upper-outer quadrant of left female breast, unspecified estrogen receptor status (HCC) 174.4 C50.412 hyaluronate sodium (RADIAPLEXRX) gel     Current dose: 50 Gy  Current fraction: 20   MEDICATIONS: Current Outpatient Prescriptions  Medication Sig Dispense Refill  . Acetaminophen (APAP ARTHRITIS PO) Take 2 tablets by mouth as needed.    Marland Kitchen amLODipine (NORVASC) 5 MG tablet TAKE 1 TABLET (5 MG TOTAL) BY MOUTH EVERY MORNING. 90 tablet 2  . aspirin 325 MG tablet Take 325 mg by mouth daily.    Marland Kitchen atorvastatin (LIPITOR) 10 MG tablet TAKE 1 TABLET (10 MG TOTAL) BY MOUTH AT BEDTIME. 90 tablet 2  . fluticasone (FLONASE) 50 MCG/ACT nasal spray Place 2 sprays into both nostrils daily. 48 g 3  . hyaluronate sodium (RADIAPLEXRX) GEL Apply 1 application topically 2 (two) times daily.    Marland Kitchen LINZESS 72 MCG capsule TAKE 1 CAPSULE (72 MCG TOTAL) BY MOUTH DAILY BEFORE BREAKFAST. 30 capsule 0  . Multiple Vitamin (MULTIVITAMIN) tablet Take 1 tablet by mouth daily.      . non-metallic deodorant Jethro Poling) MISC Apply 1 application topically daily as needed.    Marland Kitchen omega-3 acid ethyl esters (LOVAZA) 1 g capsule TAKE 1 CAPSULE (1 G TOTAL) BY MOUTH 2 (TWO) TIMES DAILY. 180 capsule 1  . pantoprazole (PROTONIX) 40 MG tablet TAKE 1 TABLET (40 MG TOTAL) BY MOUTH DAILY BEFORE BREAKFAST. 90 tablet 2  . ramipril (ALTACE) 10 MG capsule Take 1 capsule (10 mg total) by mouth 2 (two) times daily. 180 capsule 2  . triamterene-hydrochlorothiazide (MAXZIDE-25) 37.5-25 MG tablet TAKE 1 TABLET BY MOUTH EVERY DAY 90 tablet 0   No current facility-administered medications for this encounter.      ALLERGIES: Codeine and Ultram [tramadol]   LABORATORY DATA:  Lab Results  Component  Value Date   WBC 6.9 04/23/2016   HGB 12.1 04/23/2016   HCT 37.3 04/23/2016   MCV 97.6 04/23/2016   PLT 237 04/23/2016   Lab Results  Component Value Date   NA 144 08/13/2016   K 3.7 08/13/2016   CL 108 08/13/2016   CO2 24 08/13/2016   Lab Results  Component Value Date   ALT 12 04/23/2016   AST 17 04/23/2016   ALKPHOS 68 04/23/2016   BILITOT 0.3 04/23/2016     NARRATIVE: Dawn Church was seen today for weekly treatment management. The chart was checked and the patient's films were reviewed.  The patient is doing excellent. She has one more fraction remaining. She states that her skin really has done quite well without any major problems. She does use skin cream daily.  PHYSICAL EXAMINATION: weight is 172 lb 9.6 oz (78.3 kg). Her oral temperature is 98.7 F (37.1 C). Her blood pressure is 147/68 (abnormal) and her pulse is 77. Her respiration is 20.      Moderate hyperpigmentation in the treatment area. No moist desquamation.  ASSESSMENT: The patient is doing satisfactorily with treatment.  PLAN: We will continue with the patient's radiation treatment as planned.  The patient will finish her course of radiation treatment Monday next week and then will follow-up in our clinic for ongoing follow-up proximally one month later

## 2016-10-19 ENCOUNTER — Telehealth: Payer: Self-pay

## 2016-10-19 NOTE — Telephone Encounter (Signed)
No vm avail to leave appt,calendar mailed to patient for 01/2017 per 2/15 los

## 2016-10-20 ENCOUNTER — Ambulatory Visit
Admission: RE | Admit: 2016-10-20 | Discharge: 2016-10-20 | Disposition: A | Discharge status: Home / Self Care | Payer: Medicare Other | Source: Ambulatory Visit | Attending: Radiation Oncology | Admitting: Radiation Oncology

## 2016-10-20 ENCOUNTER — Ambulatory Visit: Payer: Medicare Other

## 2016-10-20 ENCOUNTER — Encounter: Payer: Self-pay | Admitting: *Deleted

## 2016-10-20 DIAGNOSIS — Z510 Encounter for antineoplastic radiation therapy: Secondary | ICD-10-CM | POA: Diagnosis not present

## 2016-10-21 ENCOUNTER — Ambulatory Visit: Payer: Medicare Other

## 2016-10-22 ENCOUNTER — Encounter: Payer: Self-pay | Admitting: Radiation Oncology

## 2016-10-22 NOTE — Progress Notes (Signed)
  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 Patient denied any pain or skin irritation during treatment. She noted using skin cream daily.  Plan: The patient has completed radiation treatment. The patient will return to radiation oncology clinic for routine followup in one month. I advised them to call or return sooner if they have any questions or concerns related to their recovery or treatment.  ------------------------------------------------  Jodelle Gross, MD, PhD  This document serves as a record of services personally performed by Kyung Rudd, MD. It was created on his behalf by Bethann Humble, a trained medical scribe. The creation of this record is based on the scribe's personal observations and the provider's statements to them. This document has been checked and approved by the attending provider.

## 2016-10-23 ENCOUNTER — Other Ambulatory Visit: Payer: Self-pay | Admitting: Family Medicine

## 2016-11-03 ENCOUNTER — Encounter (HOSPITAL_BASED_OUTPATIENT_CLINIC_OR_DEPARTMENT_OTHER): Payer: Self-pay

## 2016-11-17 ENCOUNTER — Telehealth: Payer: Self-pay | Admitting: Radiation Oncology

## 2016-11-17 ENCOUNTER — Encounter: Payer: Self-pay | Admitting: Radiation Oncology

## 2016-11-17 NOTE — Telephone Encounter (Signed)
Phone just kept ringing no VM, wanted to let patient know of appointment change, letter sent

## 2016-11-28 ENCOUNTER — Telehealth: Payer: Self-pay | Admitting: *Deleted

## 2016-11-28 NOTE — Telephone Encounter (Signed)
Called patient to alter fu appt. On 12-03-16 to 12-02-16 @ 2:30 pm, spoke with Marrian Salvage, daughter of patient and she agreed to new time and date

## 2016-11-28 NOTE — Progress Notes (Signed)
Raiya C. Frommelt 83 y.o. woman with Stage IA, T1cN0, Mx, grade 1-2, ER/PR positive invasive ductal carcinoma with DCIS of the left breast  radiation completed 10-20-16 one month FU.   Skin status: What lotion are you using?  Have you seen med onc? If not, when is appointment: If they are ER+, have they started Al or Tamoxifen? If not, why?  Discuss survivorship appointment.  Have you had a mammogram scheduled? Not scheduled yet Offer referral to [facility]. Will receive at the survivorship appointment. Oncotype Dx. Score: Appetite: Pain: Fatigue: Arm mobility: Lymphedema:

## 2016-12-02 ENCOUNTER — Encounter: Payer: Self-pay | Admitting: Family Medicine

## 2016-12-02 ENCOUNTER — Ambulatory Visit
Admission: RE | Admit: 2016-12-02 | Discharge: 2016-12-02 | Disposition: A | Discharge status: Home / Self Care | Payer: Medicare Other | Source: Ambulatory Visit | Attending: Radiation Oncology | Admitting: Radiation Oncology

## 2016-12-02 ENCOUNTER — Encounter: Payer: Self-pay | Admitting: Radiation Oncology

## 2016-12-02 ENCOUNTER — Ambulatory Visit (INDEPENDENT_AMBULATORY_CARE_PROVIDER_SITE_OTHER): Payer: Medicare Other | Admitting: Family Medicine

## 2016-12-02 ENCOUNTER — Inpatient Hospital Stay: Admission: RE | Admit: 2016-12-02 | Payer: Self-pay | Source: Ambulatory Visit | Admitting: Radiation Oncology

## 2016-12-02 VITALS — BP 128/64 | HR 72 | Temp 98.5°F | Resp 16 | Ht 63.0 in | Wt 170.0 lb

## 2016-12-02 VITALS — BP 152/74 | HR 78 | Temp 98.5°F | Resp 16 | Ht 63.0 in | Wt 171.0 lb

## 2016-12-02 DIAGNOSIS — Z170 Estrogen receptor positive status [ER+]: Secondary | ICD-10-CM | POA: Insufficient documentation

## 2016-12-02 DIAGNOSIS — E7800 Pure hypercholesterolemia, unspecified: Secondary | ICD-10-CM

## 2016-12-02 DIAGNOSIS — Z79899 Other long term (current) drug therapy: Secondary | ICD-10-CM | POA: Insufficient documentation

## 2016-12-02 DIAGNOSIS — R269 Unspecified abnormalities of gait and mobility: Secondary | ICD-10-CM

## 2016-12-02 DIAGNOSIS — Z7982 Long term (current) use of aspirin: Secondary | ICD-10-CM | POA: Insufficient documentation

## 2016-12-02 DIAGNOSIS — I10 Essential (primary) hypertension: Secondary | ICD-10-CM

## 2016-12-02 DIAGNOSIS — D0512 Intraductal carcinoma in situ of left breast: Secondary | ICD-10-CM | POA: Insufficient documentation

## 2016-12-02 DIAGNOSIS — Z885 Allergy status to narcotic agent status: Secondary | ICD-10-CM | POA: Insufficient documentation

## 2016-12-02 DIAGNOSIS — Z923 Personal history of irradiation: Secondary | ICD-10-CM | POA: Diagnosis not present

## 2016-12-02 DIAGNOSIS — C50412 Malignant neoplasm of upper-outer quadrant of left female breast: Secondary | ICD-10-CM

## 2016-12-02 NOTE — Progress Notes (Signed)
   Subjective:    Patient ID: Dawn Church, female    DOB: September 19, 1927, 83 y.o.   MRN: [id number]  Patient presents for Follow-up (is fasting)   Pt here for f/u recently completed radiation for DCIS, will be returning to oncology to discuss oral medication for next 5 years though she is leary of this. Needs Bone density scheduled as well   HTN- taking meds as prescribed, no side effects  Gait- using cane, no recent falls  No recent illness  Planning to get hearing aides    Review Of Systems:  GEN- denies fatigue, fever, weight loss,weakness, recent illness HEENT- denies eye drainage, change in vision, nasal discharge, CVS- denies chest pain, palpitations RESP- denies SOB, cough, wheeze ABD- denies N/V, change in stools, abd pain GU- denies dysuria, hematuria, dribbling, incontinence MSK- +joint pain, muscle aches, injury Neuro- denies headache, dizziness, syncope, seizure activity       Objective:    BP 128/64   Pulse 72   Temp 98.5 F (36.9 C) (Oral)   Resp 16   Ht 5\' 3"  (1.6 m)   Wt 170 lb (77.1 kg)   LMP  (LMP Unknown)   SpO2 99%   BMI 30.11 kg/m  GEN- NAD, alert and oriented x3 HEENT- PERRL, EOMI, non injected sclera, pink conjunctiva, MMM, oropharynx clear Neck- Supple, no LAD CVS- RRR, no murmur RESP-CTAB EXT- No edema Pulses- Radial  2+        Assessment & Plan:      Problem List Items Addressed This Visit    Hyperlipidemia - Primary   Essential hypertension, benign    Controlled, will repeat labs at next visit Continue use of cane, has known OA, DDD uses tylenol Has f/u with oncology, orders already made by them for bone density, given scheduling number to daughter  No change to meds today Weight is stable       Breast cancer of upper-outer quadrant of left female breast ([place])   Abnormality of gait      Note: This dictation was prepared with Dragon dictation along with smaller phrase technology. Any transcriptional errors that result  from this process are unintentional.

## 2016-12-02 NOTE — Progress Notes (Signed)
Radiation Oncology         [phone] ________________________________  Name: Dawn Church MRN: [id number]  Date: 12/02/2016  DOB: 31-Jan-1928  Post Treatment Note  CC: Vic Blackbird, MD  Excell Seltzer, MD  Diagnosis:   Stage IA, T1cN0, Mx, grade 1-2, ER/PR positive invasive ductal carcinoma with DCIS of the left breast    Interval Since Last Radiation: 6 weeks   09/22/16-10/20/16 1. Left breast/ 42.5 Gy in 17 fractions 2.  Left breast boost/ 10 Gy in 4 fractions  Narrative:  The patient returns today for routine follow-up. During treatment she did very well with radiotherapy and did not have significant desquamation.                             On review of systems, the patient states she's doing great overall. She denies any trouble with significant fatigue at this time. She has some hyperpigmentation along the left chest wall. She's also not started her antiestrogen therapy as she's awaiting the process and results of a bone density scan. No other complaints are noted.  ALLERGIES:  is allergic to codeine and ultram [tramadol].  Meds: Current Outpatient Prescriptions  Medication Sig Dispense Refill  . Acetaminophen (APAP ARTHRITIS PO) Take 2 tablets by mouth as needed.    Marland Kitchen amLODipine (NORVASC) 5 MG tablet TAKE 1 TABLET (5 MG TOTAL) BY MOUTH EVERY MORNING. 90 tablet 2  . aspirin 325 MG tablet Take 325 mg by mouth daily.    Marland Kitchen atorvastatin (LIPITOR) 10 MG tablet TAKE 1 TABLET (10 MG TOTAL) BY MOUTH AT BEDTIME. 90 tablet 2  . fluticasone (FLONASE) 50 MCG/ACT nasal spray Place 2 sprays into both nostrils daily. 48 g 3  . hyaluronate sodium (RADIAPLEXRX) GEL Apply 1 application topically 2 (two) times daily.    Marland Kitchen LINZESS 72 MCG capsule TAKE 1 CAPSULE (72 MCG TOTAL) BY MOUTH DAILY BEFORE BREAKFAST. 30 capsule 0  . Multiple Vitamin (MULTIVITAMIN) tablet Take 1 tablet by mouth daily.      . non-metallic deodorant Jethro Poling) MISC Apply 1 application topically daily as needed.      Marland Kitchen omega-3 acid ethyl esters (LOVAZA) 1 g capsule TAKE 1 CAPSULE (1 G TOTAL) BY MOUTH 2 (TWO) TIMES DAILY. 180 capsule 1  . pantoprazole (PROTONIX) 40 MG tablet TAKE 1 TABLET (40 MG TOTAL) BY MOUTH DAILY BEFORE BREAKFAST. 90 tablet 2  . ramipril (ALTACE) 10 MG capsule Take 1 capsule (10 mg total) by mouth 2 (two) times daily. 180 capsule 2  . triamterene-hydrochlorothiazide (MAXZIDE-25) 37.5-25 MG tablet TAKE 1 TABLET BY MOUTH EVERY DAY 90 tablet 0   No current facility-administered medications for this encounter.     Physical Findings:  vitals were not taken for this visit.  /10 In general this is a well appearing African American female in no acute distress. She's alert and oriented x4 and appropriate throughout the examination. Cardiopulmonary assessment is negative for acute distress and she exhibits normal effort. The left breast was examined and reveals hyperpigmentation without desquamation.   Lab Findings: Lab Results  Component Value Date   WBC 6.9 04/23/2016   HGB 12.1 04/23/2016   HCT 37.3 04/23/2016   MCV 97.6 04/23/2016   PLT 237 04/23/2016     Radiographic Findings: No results found.  Impression/Plan: 1. Stage IA, T1cN0, Mx, grade 1-2, ER/PR positive invasive ductal carcinoma with DCIS of the left breast .  The patient has been doing well  since completion of radiotherapy. We discussed that we would be happy to continue to follow her as needed, but she will also continue to follow up with Dr. Lindi Adie in medical oncology who will continue to manage her Letrozole. She was counseled on skin care as well as measures to avoid sun exposure to this area.  2. Survivorship. The patient was counseled on the role of survivorship clinic and will keep her appointment. I also gave her a copy of the monthly resources available to her at the cancer center.     Carola Rhine, PAC

## 2016-12-02 NOTE — Progress Notes (Signed)
Dawn Church 83 y.o. woman with Stage IA, T1cN0, Mx, grade 1-2, ER/PR positive invasive ductal carcinoma with DCIS of the left breast  radiation completed 10-20-16 one month FU.   Skin status:Left breast with tanning. What lotion are you using? Will start using a lotion with vitamin E. Have you seen med onc? If not, when is appointment: 10-16-16 Dr. Lindi Adie If they are ER+, have they started Al or Tamoxifen? If not, why?  Letrozole has not started will start after the bone density scan. Discuss survivorship appointment. 01-02-17 at [facility] Have you had a mammogram scheduled? Not scheduled yet Offer referral to [facility]. Will receive at the survivorship appointment.01-02-17 Oncotype Dx. Score:N/A Appetite:Good eating two meals a day and snacks Pain:3/10 to right hip taking Tylenol arthritis Fatigue:No able to do her normal daily routine. Arm mobility:She is not having any problems raising her left arm. Lymphedema:None 10-16-16 Saw Dr. Lindi Adie  RTC in 3 months after starting letrozole for tox check  Wt Readings from Last 3 Encounters:  12/02/16 171 lb (77.6 kg)  12/02/16 170 lb (77.1 kg)  10/17/16 172 lb 9.6 oz (78.3 kg)  BP (!) 152/74   Pulse 78   Temp 98.5 F (36.9 C) (Oral)   Resp 16   Ht 5\' 3"  (1.6 m)   Wt 171 lb (77.6 kg)   LMP  (LMP Unknown)   SpO2 100%   BMI 30.29 kg/m

## 2016-12-02 NOTE — Patient Instructions (Signed)
F/U Sept for Physical  

## 2016-12-03 ENCOUNTER — Encounter: Payer: Self-pay | Admitting: Family Medicine

## 2016-12-03 ENCOUNTER — Ambulatory Visit
Admission: RE | Admit: 2016-12-03 | Discharge: 2016-12-03 | Disposition: A | Discharge status: Home / Self Care | Payer: Medicare Other | Source: Ambulatory Visit | Attending: Radiation Oncology | Admitting: Radiation Oncology

## 2016-12-03 NOTE — Assessment & Plan Note (Signed)
Controlled, will repeat labs at next visit Continue use of cane, has known OA, DDD uses tylenol Has f/u with oncology, orders already made by them for bone density, given scheduling number to daughter  No change to meds today Weight is stable

## 2016-12-16 ENCOUNTER — Other Ambulatory Visit: Payer: Self-pay | Admitting: Hematology and Oncology

## 2016-12-16 DIAGNOSIS — E2839 Other primary ovarian failure: Secondary | ICD-10-CM

## 2016-12-18 ENCOUNTER — Ambulatory Visit
Admission: RE | Admit: 2016-12-18 | Discharge: 2016-12-18 | Disposition: A | Discharge status: Home / Self Care | Payer: Medicare Other | Source: Ambulatory Visit | Attending: Hematology and Oncology | Admitting: Hematology and Oncology

## 2016-12-18 DIAGNOSIS — Z780 Asymptomatic menopausal state: Secondary | ICD-10-CM

## 2016-12-25 ENCOUNTER — Other Ambulatory Visit: Payer: Self-pay | Admitting: Family Medicine

## 2016-12-25 ENCOUNTER — Telehealth: Payer: Self-pay | Admitting: *Deleted

## 2016-12-25 NOTE — Telephone Encounter (Signed)
Received call from patient daughter [name].   Inquired as to if patient should be on ASA 325mg . States that patient voiced c/o being cold constantly and she feels that she should only be on ASA 81mg .   MD please advise.

## 2016-12-26 MED ORDER — ASPIRIN EC 81 MG PO TBEC: 162.0000 mg | DELAYED_RELEASE_TABLET | Freq: Every day | ORAL | Status: AC

## 2016-12-26 NOTE — Telephone Encounter (Signed)
Call placed to patient and patient daughter [name] made aware.  Medication list updated.

## 2016-12-26 NOTE — Telephone Encounter (Signed)
She has history of stroke, she can take 2 baby asa- total 162mg  and this should be enough

## 2017-01-01 NOTE — Progress Notes (Signed)
CLINIC:  Survivorship   REASON FOR VISIT:  Routine follow-up post-treatment for a recent history of breast cancer.  BRIEF ONCOLOGIC HISTORY:    Breast cancer of upper-outer quadrant of left female breast (Dawn Church)   06/10/2016 Initial Diagnosis    Left breast biopsy 2:00 position: Grade 2 IDC with DCIS, ER 100%, PR 100%, Ki-67 5%, HER-2 negative ratio 1.05, 1.2 cm irregular mass left breast 2:00 position 8 cm from nipple, T1c N0 stage IA clinical stage      08/19/2016 Surgery    Left Lumpectomy: IDC with DCIS 1.3 cm, DCIS close to 0.1 cm to all margins;ER 100%, PR 100%, Ki-67 5%, HER-2 negative ratio 1.05, T1cNx (Stage 1A)      09/22/2016 - 10/20/2016 Radiation Therapy    Radiation Dawn Church): 1)Left breast/ 42.5 Gy in 17 fractions.  2) Left breast boost/ 10 Gy in 4 fractions      12/2016 -  Anti-estrogen oral therapy    Letrozole 2.5 mg daily       INTERVAL HISTORY:  Dawn Church presents to the [facility] today for our initial meeting to review her survivorship care plan detailing her treatment course for breast cancer, as well as monitoring long-term side effects of that treatment, education regarding health maintenance, screening, and overall wellness and health promotion.     Overall, Dawn Church reports feeling quite well since completing her radiation therapy.  She did have some skin peeling after radiation, her daughter Dawn Church who is with her today says it looks good.  She has not yet started Letrozole.  She had a bone density test and was told she would be called to be informed of the results.  She has not yet heard about her results so she hasn't started the medication.  She does have some muscle soreness and is on a few anti-hypertensives and diuretic.  She does feel like she is urinating a lot.    REVIEW OF SYSTEMS:  Review of Systems  Constitutional: Negative for appetite change, chills, fatigue, fever and unexpected weight change.  HENT:   Negative for hearing loss  and lump/mass.   Eyes: Negative for eye problems and icterus.  Respiratory: Negative for chest tightness, cough, shortness of breath and wheezing.   Cardiovascular: Negative for chest pain, leg swelling and palpitations.  Gastrointestinal: Negative for abdominal distention.  Endocrine: Negative for hot flashes.  Genitourinary: Negative for difficulty urinating.   Musculoskeletal: Negative for arthralgias.  Neurological: Negative for dizziness, extremity weakness and headaches.  Hematological: Negative for adenopathy. Does not bruise/bleed easily.  Psychiatric/Behavioral: Negative for depression. The patient is not nervous/anxious.   Breast: Denies any new nodularity, masses, tenderness, nipple changes, or nipple discharge.     ONCOLOGY TREATMENT TEAM:  1. Surgeon:  Dr. Excell Church at [facility] 2. Medical Oncologist: Dr. Lindi Church 3. Radiation Oncologist: Dr. Lisbeth Church    PAST MEDICAL/SURGICAL HISTORY:  Past Medical History:  Diagnosis Date  . Anemia   . Ankle swelling   . Cancer ([place]) 06/22/2016   left breast DCIS  . CVA (cerebral infarction)   . Epigastric pain   . GERD (gastroesophageal reflux disease)   . Hemochromatosis   . Hx of seasonal allergies   . Hypercholesteremia   . Hypertension   . Prolonged QT interval   . Stomach ulcer   . Stroke ([place]) 2015  . TIA (transient ischemic attack)    Past Surgical History:  Procedure Laterality Date  . APPENDECTOMY    . BREAST BIOPSY  Bilateral  . BREAST BIOPSY     bilateral  . BREAST LUMPECTOMY WITH RADIOACTIVE SEED LOCALIZATION Left 08/19/2016   Procedure: LEFT BREAST LUMPECTOMY WITH RADIOACTIVE SEED LOCALIZATION;  Surgeon: Dawn Seltzer, MD;  Location: [facility];  Service: General;  Laterality: Left;  . CATARACT EXTRACTION, BILATERAL    . ESOPHAGOGASTRODUODENOSCOPY  12/20/2010  . SHOULDER OPEN ROTATOR CUFF REPAIR     right   . VAGINAL HYSTERECTOMY       ALLERGIES:  Allergies  Allergen  Reactions  . Codeine Other (See Comments)    Caused patient to "space out"  . Ultram [Tramadol]     nausea     CURRENT MEDICATIONS:  Outpatient Encounter Prescriptions as of 01/02/2017  Medication Sig  . Acetaminophen (APAP ARTHRITIS PO) Take 2 tablets by mouth as needed.  Marland Kitchen amLODipine (NORVASC) 5 MG tablet TAKE 1 TABLET (5 MG TOTAL) BY MOUTH EVERY MORNING.  Marland Kitchen aspirin EC 81 MG tablet Take 2 tablets (162 mg total) by mouth daily.  Marland Kitchen atorvastatin (LIPITOR) 10 MG tablet TAKE 1 TABLET (10 MG TOTAL) BY MOUTH AT BEDTIME.  . fluticasone (FLONASE) 50 MCG/ACT nasal spray Place 2 sprays into both nostrils daily.  Marland Kitchen letrozole (FEMARA) 2.5 MG tablet Take 1 tablet (2.5 mg total) by mouth daily.  . Multiple Vitamin (MULTIVITAMIN) tablet Take 1 tablet by mouth daily.    Marland Kitchen omega-3 acid ethyl esters (LOVAZA) 1 g capsule TAKE 1 CAPSULE (1 G TOTAL) BY MOUTH 2 (TWO) TIMES DAILY.  . pantoprazole (PROTONIX) 40 MG tablet TAKE 1 TABLET (40 MG TOTAL) BY MOUTH DAILY BEFORE BREAKFAST.  . ramipril (ALTACE) 10 MG capsule Take 1 capsule (10 mg total) by mouth 2 (two) times daily.  Marland Kitchen triamterene-hydrochlorothiazide (MAXZIDE-25) 37.5-25 MG tablet TAKE 1 TABLET BY MOUTH EVERY DAY   No facility-administered encounter medications on file as of 01/02/2017.      ONCOLOGIC FAMILY HISTORY:  Family History  Problem Relation Age of Onset  . Diabetes    . Cancer    . Cancer Daughter   . Colon cancer Neg Hx      GENETIC COUNSELING/TESTING: Not done  SOCIAL HISTORY:  Dawn Church is widowed and lives alone in [place], [place].  She has 3 children and they live in [place].  Dawn Church is currently retired.  She denies any current or history of tobacco, alcohol, or illicit drug use.     PHYSICAL EXAMINATION:  Vital Signs:   Vitals:   01/02/17 1002  BP: (!) 157/62  Pulse: 73  Resp: 18  Temp: 97.6 F (36.4 C)   Filed Weights   01/02/17 1002  Weight: 171 lb 9.6 oz (77.8 kg)   General:  Well-nourished, well-appearing female in no acute distress.  She is accompanied in clinic by her daughter Dawn Church today.   HEENT: Head is normocephalic.  Pupils equal and reactive to light. Conjunctivae clear without exudate.  Sclerae anicteric. Oral mucosa is pink, moist.  Oropharynx is pink without lesions or erythema.  Lymph: No cervical, supraclavicular, or infraclavicular lymphadenopathy noted on palpation.  Cardiovascular: Regular rate and rhythm.Marland Kitchen Respiratory: Clear to auscultation bilaterally. Chest expansion symmetric; breathing non-labored.  GI: Abdomen soft and round; non-tender, non-distended. Bowel sounds normoactive.  GU: Deferred.  Neuro: No focal deficits. Steady gait.  Psych: Mood and affect normal and appropriate for situation.  Extremities: No edema. Skin: Warm and dry.  LABORATORY DATA:  None for this visit.  DIAGNOSTIC IMAGING:  None for this visit.  ASSESSMENT AND PLAN:  Ms.. Church is a pleasant 83 y.o. female with Stage IA right/left breast invasive ductal carcinoma, ER+/PR+/HER2-, diagnosed in (date), treated with lumpectomy, adjuvant radiation therapy, and anti-estrogen therapy with Letrozole beginning today.  She presents to the Survivorship Clinic for our initial meeting and routine follow-up post-completion of treatment for breast cancer.    1. Stage IA left breast cancer:  Dawn Church is continuing to recover from definitive treatment for breast cancer. She will follow-up with her medical oncologist, Dr. Lindi Church in 01/2017 with history and physical exam per surveillance protocol.  She will go ahead and start Letrozole today. Today, a comprehensive survivorship care plan and treatment summary was reviewed with the patient today detailing her breast cancer diagnosis, treatment course, potential late/long-term effects of treatment, appropriate follow-up care with recommendations for the future, and patient education resources.  A copy of this summary, along with a  letter will be sent to the patient's primary care provider via mail/fax/In Basket message after today's visit.    2. Bone health:  Given Dawn Church's age/history of breast cancer and her current treatment regimen including anti-estrogen therapy with Letrozole she is at risk for bone demineralization.  Her last DEXA scan was on 12/19/2016 and it revealed a normal bone density.     She was given education on specific activities to promote bone health.  3. Cancer screening:  Due to Dawn Church's history and her age, she should receive screening for skin cancers, colon cancer, and gynecologic cancers.  The information and recommendations are listed on the patient's comprehensive care plan/treatment summary and were reviewed in detail with the patient.    4. Health maintenance and wellness promotion: Dawn Church was encouraged to consume 5-7 servings of fruits and vegetables per day. We reviewed the "Nutrition Rainbow" handout, as well as the handout "Take Control of Your Health and Reduce Your Cancer Risk" from the [employer].  She was also encouraged to engage in moderate to vigorous exercise for 30 minutes per day most days of the week. We discussed the [employer] fitness program, which is designed for cancer survivors to help them become more physically fit after cancer treatments.  She was instructed to limit her alcohol consumption and continue to abstain from tobacco use.     5. Support services/counseling: It is not uncommon for this period of the patient's cancer care trajectory to be one of many emotions and stressors.  We discussed an opportunity for her to participate in the next session of [facility] ("Finding Your New Normal") support group series designed for patients after they have completed treatment.   Dawn Church was encouraged to take advantage of our many other support services programs, support groups, and/or counseling in coping with her new life as a cancer survivor after  completing anti-cancer treatment.  She was offered support today through active listening and expressive supportive counseling.  She was given information regarding our available services and encouraged to contact me with any questions or for help enrolling in any of our support group/programs.   6. Muscle aches and pains: Will get labs to further evaluate this.  May need f/u with her PCP about this.     Dispo:   -Return to cancer center in June for follow up with Dr. Lindi Church as scheduled  -She is welcome to return back to the Survivorship Clinic at any time; no additional follow-up needed at this time.  -Consider referral back to survivorship as a long-term survivor for  continued surveillance  A total of (30) minutes of face-to-face time was spent with this patient with greater than 50% of that time in counseling and care-coordination.   Gardenia Phlegm, NP Survivorship Program [facility] [phone]   Note: PRIMARY CARE PROVIDER Vic Blackbird, [place] [phone]

## 2017-01-02 ENCOUNTER — Telehealth: Payer: Self-pay | Admitting: *Deleted

## 2017-01-02 ENCOUNTER — Encounter: Payer: Self-pay | Admitting: Adult Health

## 2017-01-02 ENCOUNTER — Ambulatory Visit (HOSPITAL_BASED_OUTPATIENT_CLINIC_OR_DEPARTMENT_OTHER): Payer: Medicare Other

## 2017-01-02 ENCOUNTER — Ambulatory Visit (HOSPITAL_BASED_OUTPATIENT_CLINIC_OR_DEPARTMENT_OTHER): Payer: Medicare Other | Admitting: Adult Health

## 2017-01-02 VITALS — BP 157/62 | HR 73 | Temp 97.6°F | Resp 18 | Ht 63.0 in | Wt 171.6 lb

## 2017-01-02 DIAGNOSIS — C50412 Malignant neoplasm of upper-outer quadrant of left female breast: Secondary | ICD-10-CM

## 2017-01-02 DIAGNOSIS — Z79811 Long term (current) use of aromatase inhibitors: Secondary | ICD-10-CM | POA: Diagnosis not present

## 2017-01-02 DIAGNOSIS — M791 Myalgia: Secondary | ICD-10-CM | POA: Diagnosis not present

## 2017-01-02 LAB — COMPREHENSIVE METABOLIC PANEL
ALBUMIN: 3.7 g/dL (ref 3.5–5.0)
ALT: 15 U/L (ref 0–55)
ANION GAP: 11 meq/L (ref 3–11)
AST: 22 U/L (ref 5–34)
Alkaline Phosphatase: 81 U/L (ref 40–150)
BUN: 15.7 mg/dL (ref 7.0–26.0)
CALCIUM: 10.7 mg/dL — AB (ref 8.4–10.4)
CHLORIDE: 107 meq/L (ref 98–109)
CO2: 27 mEq/L (ref 22–29)
Creatinine: 0.9 mg/dL (ref 0.6–1.1)
EGFR: 67 mL/min/{1.73_m2} — ABNORMAL LOW (ref >90)
Glucose: 119 mg/dl (ref 70–140)
Potassium: 3.9 mEq/L (ref 3.5–5.1)
Sodium: 144 mEq/L (ref 136–145)
TOTAL PROTEIN: 7 g/dL (ref 6.4–8.3)
Total Bilirubin: 0.43 mg/dL (ref 0.20–1.20)

## 2017-01-02 MED ORDER — LETROZOLE 2.5 MG PO TABS: 2.5000 mg | ORAL_TABLET | Freq: Every day | ORAL | 11 refills | Status: DC

## 2017-01-02 NOTE — Telephone Encounter (Signed)
-----   Message from Gardenia Phlegm, NP sent at 01/02/2017 12:45 PM EDT ----- Please call and give patient results and fax to her pcp.  Is she taking calcium?  Is she drinking enough water?

## 2017-01-02 NOTE — Telephone Encounter (Signed)
Per Mendel Ryder, NP faxed patient lab results to PCP office, also an attempt made to notify the patient of her test results. Unable to leave a voice message.

## 2017-01-12 ENCOUNTER — Telehealth: Payer: Self-pay | Admitting: *Deleted

## 2017-01-12 NOTE — Telephone Encounter (Signed)
Pt concerned about SE with Femera Discussed with her importance of med with her breast cancer Discussed some side effects, headache, nausea, fatigue, joint pain, but should improve after being on the medication. She is willing to try

## 2017-01-12 NOTE — Telephone Encounter (Signed)
Received call from patient.   States that she was given new prescription from oncology and would like to discuss with MD. Only wanted to speak with MD.   Requested return call at [phone]~ telephone.   MD to be made aware.

## 2017-01-23 ENCOUNTER — Other Ambulatory Visit: Payer: Self-pay | Admitting: Family Medicine

## 2017-02-13 ENCOUNTER — Ambulatory Visit: Payer: Medicare Other | Admitting: Hematology and Oncology

## 2017-02-24 ENCOUNTER — Ambulatory Visit (HOSPITAL_BASED_OUTPATIENT_CLINIC_OR_DEPARTMENT_OTHER): Payer: Medicare Other | Admitting: Hematology and Oncology

## 2017-02-24 ENCOUNTER — Encounter: Payer: Self-pay | Admitting: Hematology and Oncology

## 2017-02-24 DIAGNOSIS — C50412 Malignant neoplasm of upper-outer quadrant of left female breast: Secondary | ICD-10-CM

## 2017-02-24 DIAGNOSIS — Z79811 Long term (current) use of aromatase inhibitors: Secondary | ICD-10-CM | POA: Diagnosis not present

## 2017-02-24 DIAGNOSIS — Z170 Estrogen receptor positive status [ER+]: Secondary | ICD-10-CM | POA: Diagnosis not present

## 2017-02-24 NOTE — Progress Notes (Signed)
Patient Care Team: [facility], Modena Nunnery, MD as PCP - General (Family Medicine) Delice Bison, Charlestine Massed, NP as Nurse Practitioner (Hematology and Oncology) Excell Seltzer, MD as Consulting Physician (General Surgery) Nicholas Lose, MD as Consulting Physician (Hematology and Oncology) Kyung Rudd, MD as Consulting Physician (Radiation Oncology)  DIAGNOSIS:  Encounter Diagnosis  Name Primary?  . Malignant neoplasm of upper-outer quadrant of left breast in female, estrogen receptor positive ([place])     SUMMARY OF ONCOLOGIC HISTORY:   Breast cancer of upper-outer quadrant of left female breast ([place])   06/10/2016 Initial Diagnosis    Left breast biopsy 2:00 position: Grade 2 IDC with DCIS, ER 100%, PR 100%, Ki-67 5%, HER-2 negative ratio 1.05, 1.2 cm irregular mass left breast 2:00 position 8 cm from nipple, T1c N0 stage IA clinical stage      08/19/2016 Surgery    Left Lumpectomy: IDC with DCIS 1.3 cm, DCIS close to 0.1 cm to all margins;ER 100%, PR 100%, Ki-67 5%, HER-2 negative ratio 1.05, T1cNx (Stage 1A)      09/22/2016 - 10/20/2016 Radiation Therapy    Radiation [name]): 1)Left breast/ 42.5 Gy in 17 fractions.  2) Left breast boost/ 10 Gy in 4 fractions      12/2016 -  Anti-estrogen oral therapy    Letrozole 2.5 mg daily       CHIEF COMPLIANT: follow-up on letrozole therapy  INTERVAL HISTORY: HILJA KINTZEL is a 83 year old with above-mentioned history of left breast cancer treated with lumpectomy and radiation is currently on letrozole.She is tolerating letrozole well. She denies any hot flashes or myalgias. She denies any lumps or nodulesthe breast. She has noticed a small skin lesion on the right side of the nose which she is worried about skin cancer.  REVIEW OF SYSTEMS:   Constitutional: Denies fevers, chills or abnormal weight loss Eyes: Denies blurriness of vision Ears, nose, mouth, throat, and face: Denies mucositis or sore throat Respiratory: Denies cough, dyspnea  or wheezes Cardiovascular: Denies palpitation, chest discomfort Gastrointestinal:  Denies nausea, heartburn or change in bowel habits Skin: Denies abnormal skin rashes Lymphatics: Denies new lymphadenopathy or easy bruising Neurological:Denies numbness, tingling or new weaknesses Behavioral/Psych: Mood is stable, no new changes  Extremities: No lower extremity edema Breast:  denies any pain or lumps or nodules in either breasts All other systems were reviewed with the patient and are negative.  I have reviewed the past medical history, past surgical history, social history and family history with the patient and they are unchanged from previous note.  ALLERGIES:  is allergic to codeine and ultram [tramadol].  MEDICATIONS:  Current Outpatient Prescriptions  Medication Sig Dispense Refill  . Acetaminophen (APAP ARTHRITIS PO) Take 2 tablets by mouth as needed.    Marland Kitchen amLODipine (NORVASC) 5 MG tablet TAKE 1 TABLET (5 MG TOTAL) BY MOUTH EVERY MORNING. 90 tablet 2  . aspirin EC 81 MG tablet Take 2 tablets (162 mg total) by mouth daily.    Marland Kitchen atorvastatin (LIPITOR) 10 MG tablet TAKE 1 TABLET (10 MG TOTAL) BY MOUTH AT BEDTIME. 90 tablet 2  . fluticasone (FLONASE) 50 MCG/ACT nasal spray Place 2 sprays into both nostrils daily. 48 g 3  . letrozole (FEMARA) 2.5 MG tablet Take 1 tablet (2.5 mg total) by mouth daily. 30 tablet 11  . Multiple Vitamin (MULTIVITAMIN) tablet Take 1 tablet by mouth daily.      Marland Kitchen omega-3 acid ethyl esters (LOVAZA) 1 g capsule TAKE 1 CAPSULE (1 G TOTAL) BY MOUTH 2 (  TWO) TIMES DAILY. 180 capsule 1  . pantoprazole (PROTONIX) 40 MG tablet TAKE 1 TABLET (40 MG TOTAL) BY MOUTH DAILY BEFORE BREAKFAST. 90 tablet 2  . ramipril (ALTACE) 10 MG capsule Take 1 capsule (10 mg total) by mouth 2 (two) times daily. 180 capsule 2  . triamterene-hydrochlorothiazide (MAXZIDE-25) 37.5-25 MG tablet TAKE 1 TABLET BY MOUTH EVERY DAY 90 tablet 0   No current facility-administered medications for  this visit.     PHYSICAL EXAMINATION: ECOG PERFORMANCE STATUS: 1 - Symptomatic but completely ambulatory  Vitals:   02/24/17 1153  BP: (!) 172/71  Pulse: 73  Resp: 16  Temp: 98.2 F (36.8 C)   Filed Weights   02/24/17 1153  Weight: 176 lb 6.4 oz (80 kg)    GENERAL:alert, no distress and comfortable SKIN: skin color, texture, turgor are normal, no rashes or significant lesions EYES: normal, Conjunctiva are pink and non-injected, sclera clear OROPHARYNX:no exudate, no erythema and lips, buccal mucosa, and tongue normal  NECK: supple, thyroid normal size, non-tender, without nodularity LYMPH:  no palpable lymphadenopathy in the cervical, axillary or inguinal LUNGS: clear to auscultation and percussion with normal breathing effort HEART: regular rate & rhythm and no murmurs and no lower extremity edema ABDOMEN:abdomen soft, non-tender and normal bowel sounds MUSCULOSKELETAL:no cyanosis of digits and no clubbing  NEURO: alert & oriented x 3 with fluent speech, no focal motor/sensory deficits EXTREMITIES: No lower extremity edema BREAST: No palpable masses or nodules in either right or left breasts. No palpable axillary supraclavicular or infraclavicular adenopathy no breast tenderness or nipple discharge. (exam performed in the presence of a chaperone)  LABORATORY DATA:  I have reviewed the data as listed   Chemistry      Component Value Date/Time   NA 144 01/02/2017 1057   K 3.9 01/02/2017 1057   CL 108 08/13/2016 1300   CO2 27 01/02/2017 1057   BUN 15.7 01/02/2017 1057   CREATININE 0.9 01/02/2017 1057      Component Value Date/Time   CALCIUM 10.7 (H) 01/02/2017 1057   ALKPHOS 81 01/02/2017 1057   AST 22 01/02/2017 1057   ALT 15 01/02/2017 1057   BILITOT 0.43 01/02/2017 1057       Lab Results  Component Value Date   WBC 6.9 04/23/2016   HGB 12.1 04/23/2016   HCT 37.3 04/23/2016   MCV 97.6 04/23/2016   PLT 237 04/23/2016   NEUTROABS 4,830 04/23/2016     ASSESSMENT & PLAN:  Breast cancer of upper-outer quadrant of left female breast ([place]) Left Lumpectomy 08/19/16: IDC with DCIS 1.3 cm, DCIS close to 0.1 cm to all margins;ER 100%, PR 100%, Ki-67 5%, HER-2 negative ratio 1.05, T1cNx (Stage 1A) Adj XRT completed 10/20/16  Current treatment: Letrozole 2.5 mg daily started 01/02/2017 Bone density 12/19/2016: Normal T score 0.4  Letrozole toxicities: Denies any hot flashes or myalgias. Slight discomfort in the right hip intermittently.  Hypertension: Patient reports that it is only in the hospital that she has high blood pressure. She will follow with her primary care physician. Skin lesion on the nose: She will check with her PCP if she could be referred to dermatologist.  Patient expressed interest in continuing antiestrogen therapy for the time being. Return to clinic in 6 months for follow-up and after that she can be seen once a year    I spent 25 minutes talking to the patient of which more than half was spent in counseling and coordination of care.  No orders of  the defined types were placed in this encounter.  The patient has a good understanding of the overall plan. she agrees with it. she will call with any problems that may develop before the next visit here.   Rulon Eisenmenger, MD 02/24/17

## 2017-02-24 NOTE — Assessment & Plan Note (Signed)
Left Lumpectomy 08/19/16: IDC with DCIS 1.3 cm, DCIS close to 0.1 cm to all margins;ER 100%, PR 100%, Ki-67 5%, HER-2 negative ratio 1.05, T1cNx (Stage 1A) Adj XRT completed 10/20/16  Current treatment: Letrozole 2.5 mg daily started  Bone density 12/19/2016: Normal T score 0.4  Letrozole toxicities: Return to clinic in 6 months for follow-up

## 2017-03-18 ENCOUNTER — Other Ambulatory Visit: Payer: Self-pay | Admitting: Family Medicine

## 2017-03-24 ENCOUNTER — Telehealth: Payer: Self-pay | Admitting: *Deleted

## 2017-03-24 MED ORDER — LINACLOTIDE 72 MCG PO CAPS: 72.0000 ug | ORAL_CAPSULE | Freq: Every day | ORAL | 3 refills | Status: DC

## 2017-03-24 NOTE — Telephone Encounter (Signed)
Received call from patient.   Reports that she is having increasingly hard stools. Requested to resume Linzess.   MD please advise.

## 2017-03-24 NOTE — Telephone Encounter (Signed)
Prescription sent to pharmacy. .   Call placed to patient and patient made aware.  

## 2017-03-24 NOTE — Telephone Encounter (Signed)
Put on Linzess 72mg  once a day

## 2017-03-26 ENCOUNTER — Other Ambulatory Visit: Payer: Self-pay | Admitting: Family Medicine

## 2017-04-12 ENCOUNTER — Encounter (HOSPITAL_COMMUNITY): Payer: Self-pay | Admitting: *Deleted

## 2017-04-12 ENCOUNTER — Emergency Department (HOSPITAL_COMMUNITY)
Admission: EM | Admit: 2017-04-12 | Discharge: 2017-04-12 | Disposition: A | Discharge status: Home / Self Care | Payer: Medicare Other | Attending: Emergency Medicine | Admitting: Emergency Medicine

## 2017-04-12 ENCOUNTER — Emergency Department (HOSPITAL_COMMUNITY): Payer: Medicare Other

## 2017-04-12 DIAGNOSIS — Z79899 Other long term (current) drug therapy: Secondary | ICD-10-CM | POA: Insufficient documentation

## 2017-04-12 DIAGNOSIS — M545 Low back pain, unspecified: Secondary | ICD-10-CM

## 2017-04-12 DIAGNOSIS — Z853 Personal history of malignant neoplasm of breast: Secondary | ICD-10-CM | POA: Insufficient documentation

## 2017-04-12 DIAGNOSIS — I10 Essential (primary) hypertension: Secondary | ICD-10-CM | POA: Diagnosis not present

## 2017-04-12 DIAGNOSIS — Z7982 Long term (current) use of aspirin: Secondary | ICD-10-CM | POA: Insufficient documentation

## 2017-04-12 MED ORDER — IBUPROFEN 800 MG PO TABS: 800.0000 mg | ORAL_TABLET | Freq: Three times a day (TID) | ORAL | 0 refills | Status: DC

## 2017-04-12 MED ORDER — METHOCARBAMOL 500 MG PO TABS: 500.0000 mg | ORAL_TABLET | Freq: Two times a day (BID) | ORAL | 0 refills | Status: DC | PRN

## 2017-04-12 MED ORDER — IBUPROFEN 800 MG PO TABS
800.0000 mg | ORAL_TABLET | Freq: Once | ORAL | Status: AC
  Administered 2017-04-12: 800 mg via ORAL
  Filled 2017-04-12: qty 1

## 2017-04-12 NOTE — ED Notes (Signed)
Patient transported to X-ray via wheelchair and they had to get bed to bring her back from xray

## 2017-04-12 NOTE — ED Provider Notes (Signed)
[facility] DEPT Provider Note   CSN: [id number] Arrival date & time: 04/12/17  2053     History   Chief Complaint Chief Complaint  Patient presents with  . Back Pain    HPI Dawn Church is a 83 y.o. female.  HPI  The patient is an 83 year old female who has a history of breast cancer, history of intermittent low back pain over time but does not have this very frequently and has never had back surgery. She reports that last night she was bending over into her file cabinet trying to get a file when she felt acute onset of pain in her lower back. This has prevented her from being very mobile and she has been doing lots of sitting and has had such pain that she was unable to sleep last night. She fell asleep this morning and was able to sleep for several hours but did not go to church because of the pain. She denies numbness or weakness of the legs, she denies any urinary symptoms and has no fevers. She reports having a recent bone density study which was normal.she has not had any medications for the pain prior to arrival.  Past Medical History:  Diagnosis Date  . Anemia   . Ankle swelling   . Cancer ([place]) 06/22/2016   left breast DCIS  . CVA (cerebral infarction)   . Epigastric pain   . GERD (gastroesophageal reflux disease)   . Hemochromatosis   . Hx of seasonal allergies   . Hypercholesteremia   . Hypertension   . Prolonged QT interval   . Stomach ulcer   . Stroke ([place]) 2015  . TIA (transient ischemic attack)     Patient Active Problem List   Diagnosis Date Noted  . Breast cancer of upper-outer quadrant of left female breast ([place]) 07/14/2016  . Constipation 02/12/2016  . Gait instability 05/08/2015  . CVA (cerebral vascular accident) ([place]) 07/05/2014  . Seborrheic keratoses, inflamed 03/08/2014  . Decreased hearing 03/08/2014  . Generalized OA 04/11/2013  . Pes anserinus bursitis of left knee 12/28/2012  . Abnormality of gait 05/18/2012  . Lower back pain  05/11/2012  . Essential hypertension, benign 04/17/2012  . Hip pain, bilateral 04/17/2012  . Hyperlipidemia 04/17/2012  . H/O: stroke 04/17/2012  . Iron deficiency anemia 04/17/2012    Past Surgical History:  Procedure Laterality Date  . APPENDECTOMY    . BREAST BIOPSY     Bilateral  . BREAST BIOPSY     bilateral  . BREAST LUMPECTOMY WITH RADIOACTIVE SEED LOCALIZATION Left 08/19/2016   Procedure: LEFT BREAST LUMPECTOMY WITH RADIOACTIVE SEED LOCALIZATION;  Surgeon: Excell Seltzer, MD;  Location: [facility];  Service: General;  Laterality: Left;  . CATARACT EXTRACTION, BILATERAL    . ESOPHAGOGASTRODUODENOSCOPY  12/20/2010  . SHOULDER OPEN ROTATOR CUFF REPAIR     right   . VAGINAL HYSTERECTOMY      OB History    Gravida Para Term Preterm AB Living   10 10 10     10    SAB TAB Ectopic Multiple Live Births                   Home Medications    Prior to Admission medications   Medication Sig Start Date End Date Taking? Authorizing Provider  Acetaminophen (APAP ARTHRITIS PO) Take 2 tablets by mouth as needed.    [provider]  amLODipine (NORVASC) 5 MG tablet TAKE 1 TABLET (5 MG TOTAL) BY MOUTH EVERY  MORNING. 01/23/17   Alycia Rossetti, MD  aspirin EC 81 MG tablet Take 2 tablets (162 mg total) by mouth daily. 12/26/16   Alycia Rossetti, MD  atorvastatin (LIPITOR) 10 MG tablet TAKE 1 TABLET (10 MG TOTAL) BY MOUTH AT BEDTIME. 07/15/16   Tryon, Modena Nunnery, MD  fluticasone [facility]) 50 MCG/ACT nasal spray Place 2 sprays into both nostrils daily. 04/04/14   Alycia Rossetti, MD  letrozole [facility]) 2.5 MG tablet Take 1 tablet (2.5 mg total) by mouth daily. 01/02/17   Gardenia Phlegm, NP  linaclotide [facility]) 72 MCG capsule Take 1 capsule (72 mcg total) by mouth daily before breakfast. 03/24/17   Alycia Rossetti, MD  Multiple Vitamin (MULTIVITAMIN) tablet Take 1 tablet by mouth daily.      [provider]  omega-3 acid ethyl esters  (LOVAZA) 1 g capsule TAKE 1 CAPSULE (1 G TOTAL) BY MOUTH 2 (TWO) TIMES DAILY. 03/18/17   Alycia Rossetti, MD  pantoprazole (PROTONIX) 40 MG tablet TAKE 1 TABLET (40 MG TOTAL) BY MOUTH DAILY BEFORE BREAKFAST. 10/13/16   Alycia Rossetti, MD  ramipril (ALTACE) 10 MG capsule Take 1 capsule (10 mg total) by mouth 2 (two) times daily. 02/12/16   Alycia Rossetti, MD  triamterene-hydrochlorothiazide [facility]) 37.5-25 MG tablet TAKE 1 TABLET BY MOUTH EVERY DAY 03/26/17   Alycia Rossetti, MD    Family History Family History  Problem Relation Age of Onset  . Diabetes Unknown   . Cancer Unknown   . Cancer Daughter   . Colon cancer Neg Hx     Social History Social History  Substance Use Topics  . Smoking status: Never Smoker  . Smokeless tobacco: Never Used  . Alcohol use No     Allergies   Codeine and Ultram [tramadol]   Review of Systems Review of Systems  Constitutional: Negative for chills and fever.  Cardiovascular: Negative for leg swelling.  Gastrointestinal: Negative for nausea and vomiting.       No incontinence of bowel  Genitourinary: Negative for difficulty urinating.       No incontinence or retention  Musculoskeletal: Positive for back pain. Negative for neck pain.  Skin: Negative for rash.  Neurological: Negative for weakness and numbness.     Physical Exam Updated Vital Signs BP (!) 159/73 (BP Location: Right Arm)   Pulse 80   Temp 98.8 F (37.1 C) (Oral)   Resp 18   Ht 5\' 3"  (1.6 m)   Wt 79.8 kg (176 lb)   LMP  (LMP Unknown)   SpO2 95%   BMI 31.18 kg/m   Physical Exam  Constitutional: She appears well-developed and well-nourished. No distress.  HENT:  Head: Normocephalic and atraumatic.  Eyes: Conjunctivae are normal. Right eye exhibits no discharge. Left eye exhibits no discharge. No scleral icterus.  Cardiovascular: Normal rate and regular rhythm.   Pulmonary/Chest: Effort normal and breath sounds normal.  Musculoskeletal: She exhibits no  edema.  Tenderness of the back over the L4 and L5 region of the lumbar spine and the left-sided paraspinal muscles. No tenderness over the Cervical, Thoracic  Neurological:  Speech is clear, strength in the UE and LE's are normal at the major muscle groups including the hip, knee and ankles.  Sensation in tact to light touch and pin prick of the bilateral LE's.  Normal reflexes at the knees bilaterally.  Gait antalgic secondary to pain  Skin: Skin is warm and dry. No rash noted. She is not  diaphoretic.     ED Treatments / Results  Labs (all labs ordered are listed, but only abnormal results are displayed) Labs Reviewed - No data to display   Radiology No results found.  Procedures Procedures (including critical care time)  Medications Ordered in ED Medications  ibuprofen (ADVIL,MOTRIN) tablet 800 mg (not administered)     Initial Impression / Assessment and Plan / ED Course  I have reviewed the triage vital signs and the nursing notes.  Pertinent labs & imaging results that were available during my care of the patient were reviewed by me and considered in my medical decision making (see chart for details).     xrays Pain medicine No neuro findings  cjhange of shift - care signed out to Evalee Jefferson to f/u results and disposition accordingly  Final Clinical Impressions(s) / ED Diagnoses   Final diagnoses:  None    New Prescriptions New Prescriptions   No medications on file     Noemi Chapel, MD 04/12/17 2217

## 2017-04-12 NOTE — ED Triage Notes (Signed)
Pt c/o lower back pain that started yesterday, denies any injury,

## 2017-04-12 NOTE — Discharge Instructions (Signed)

## 2017-04-12 NOTE — ED Notes (Signed)
ED Provider at bedside. 

## 2017-04-12 NOTE — ED Notes (Signed)
Pt states that she was sitting in a chair bent over going through a filing cabinet for about 30 minutes and when she tried to get up she started having pain in lower back area,

## 2017-04-30 ENCOUNTER — Encounter: Payer: Self-pay | Admitting: Family Medicine

## 2017-04-30 ENCOUNTER — Ambulatory Visit (INDEPENDENT_AMBULATORY_CARE_PROVIDER_SITE_OTHER): Payer: Medicare Other | Admitting: Family Medicine

## 2017-04-30 VITALS — BP 158/68 | HR 72 | Temp 98.4°F | Resp 16 | Ht 63.0 in | Wt 166.0 lb

## 2017-04-30 DIAGNOSIS — I10 Essential (primary) hypertension: Secondary | ICD-10-CM | POA: Diagnosis not present

## 2017-04-30 DIAGNOSIS — M159 Polyosteoarthritis, unspecified: Secondary | ICD-10-CM

## 2017-04-30 DIAGNOSIS — L304 Erythema intertrigo: Secondary | ICD-10-CM | POA: Diagnosis not present

## 2017-04-30 DIAGNOSIS — R2681 Unsteadiness on feet: Secondary | ICD-10-CM | POA: Diagnosis not present

## 2017-04-30 DIAGNOSIS — R634 Abnormal weight loss: Secondary | ICD-10-CM | POA: Diagnosis not present

## 2017-04-30 DIAGNOSIS — R5383 Other fatigue: Secondary | ICD-10-CM | POA: Diagnosis not present

## 2017-04-30 LAB — CBC WITH DIFFERENTIAL/PLATELET
Basophils Absolute: 0 cells/uL (ref 0–200)
Basophils Relative: 0 %
EOS ABS: 53 {cells}/uL (ref 15–500)
Eosinophils Relative: 1 %
HCT: 34.3 % — ABNORMAL LOW (ref 35.0–45.0)
Hemoglobin: 11.5 g/dL — ABNORMAL LOW (ref 12.0–15.0)
LYMPHS PCT: 20 %
Lymphs Abs: 1060 cells/uL (ref 850–3900)
MCH: 33.9 pg — AB (ref 27.0–33.0)
MCHC: 33.5 g/dL (ref 32.0–36.0)
MCV: 101.2 fL — AB (ref 80.0–100.0)
MONO ABS: 424 {cells}/uL (ref 200–950)
MPV: 8.9 fL (ref 7.5–12.5)
Monocytes Relative: 8 %
Neutro Abs: 3763 cells/uL (ref 1500–7800)
Neutrophils Relative %: 71 %
PLATELETS: 220 10*3/uL (ref 140–400)
RBC: 3.39 MIL/uL — AB (ref 3.80–5.10)
RDW: 14.8 % (ref 11.0–15.0)
WBC: 5.3 10*3/uL (ref 3.8–10.8)

## 2017-04-30 MED ORDER — NYSTATIN 100000 UNIT/GM EX POWD: Freq: Four times a day (QID) | CUTANEOUS | 2 refills | Status: DC

## 2017-04-30 NOTE — Assessment & Plan Note (Signed)
Good control for age, she needs all of her BP meds No hypotensive episodes  She has good quality of life with linzess and Gerd MEDICATIONso I see none to stop at this time
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
## 2017-04-30 NOTE — Assessment & Plan Note (Signed)
request rollator with seat to help wiht balance and mobility within home and outside of home

## 2017-04-30 NOTE — Patient Instructions (Addendum)
We will call with lab results Nystatin powder No change to medications F/U Change Wellness to October

## 2017-05-01 LAB — COMPREHENSIVE METABOLIC PANEL
ALT: 11 U/L (ref 6–29)
AST: 20 U/L (ref 10–35)
Albumin: 4 g/dL (ref 3.6–5.1)
Alkaline Phosphatase: 67 U/L (ref 33–130)
BUN: 23 mg/dL (ref 7–25)
CHLORIDE: 109 mmol/L (ref 98–110)
CO2: 20 mmol/L (ref 20–32)
Calcium: 10.6 mg/dL — ABNORMAL HIGH (ref 8.6–10.4)
Creat: 0.95 mg/dL — ABNORMAL HIGH (ref 0.60–0.88)
GLUCOSE: 158 mg/dL — AB (ref 70–99)
POTASSIUM: 3.8 mmol/L (ref 3.5–5.3)
SODIUM: 144 mmol/L (ref 135–146)
Total Bilirubin: 0.4 mg/dL (ref 0.2–1.2)
Total Protein: 6.4 g/dL (ref 6.1–8.1)

## 2017-05-02 ENCOUNTER — Other Ambulatory Visit: Payer: Self-pay | Admitting: Family Medicine

## 2017-05-05 ENCOUNTER — Telehealth: Payer: Self-pay | Admitting: Family Medicine

## 2017-05-05 NOTE — Telephone Encounter (Signed)
New Message  Pts daughter voiced wanting to know which medications the pt has been taken off of.  Please f/u with pts daughter

## 2017-05-05 NOTE — Telephone Encounter (Signed)
lvm for laverne to return call

## 2017-05-05 NOTE — Telephone Encounter (Signed)
Refill appropriate 

## 2017-05-06 NOTE — Telephone Encounter (Signed)
Spoke with daughter [name] and explained that her mother could come off of the calcium supplements including Tums. Daughter recommended she be called regarding lab results for her mother.

## 2017-05-19 ENCOUNTER — Encounter: Payer: Medicare Other | Admitting: Family Medicine

## 2017-06-02 ENCOUNTER — Encounter: Payer: Self-pay | Admitting: Family Medicine

## 2017-06-02 ENCOUNTER — Ambulatory Visit (INDEPENDENT_AMBULATORY_CARE_PROVIDER_SITE_OTHER): Payer: Medicare Other | Admitting: Family Medicine

## 2017-06-02 VITALS — BP 150/68 | HR 86 | Temp 98.5°F | Resp 14 | Ht 63.0 in | Wt 168.0 lb

## 2017-06-02 DIAGNOSIS — Z0000 Encounter for general adult medical examination without abnormal findings: Secondary | ICD-10-CM

## 2017-06-02 DIAGNOSIS — I10 Essential (primary) hypertension: Secondary | ICD-10-CM | POA: Diagnosis not present

## 2017-06-02 DIAGNOSIS — E7800 Pure hypercholesterolemia, unspecified: Secondary | ICD-10-CM | POA: Diagnosis not present

## 2017-06-02 DIAGNOSIS — Z23 Encounter for immunization: Secondary | ICD-10-CM

## 2017-06-02 NOTE — Progress Notes (Signed)
Subjective:   Patient presents for Medicare Annual/Subsequent preventive examination.  Patient here for a no wellness exam medications reviewed she was here about a month ago. She had a rash beneath the breasts which I gave her nystatin powder She also now has a RollatorBut she is not using regularly as prescribed. She has not had any falls Review Past Medical/Family/Social: per EMR    Risk Factors  Current exercise habits: minimal Dietary issues discussed: taking in protein, eating regulary   Cardiac risk factors: CVA, hyperlipidemia, HTN  Depression Screen  (Note: if answer to either of the following is "Yes", a more complete depression screening is indicated)  Over the past two weeks, have you felt down, depressed or hopeless? No Over the past two weeks, have you felt little interest or pleasure in doing things? No Have you lost interest or pleasure in daily life? No Do you often feel hopeless? No Do you cry easily over simple problems? No   Activities of Daily Living  In your present state of health, do you have any difficulty performing the following activities?:  Driving? No  Managing money? No  Feeding yourself? No  Getting from bed to chair? No  Climbing a flight of stairs? No  Preparing food and eating?: No  Bathing or showering? No  Getting dressed: No  Getting to the toilet? No  Using the toilet:No  Moving around from place to place: No  In the past year have you fallen or had a near fall?:No  Are you sexually active? No  Do you have more than one partner? No   Hearing Difficulties: No  Do you often ask people to speak up or repeat themselves? No  Do you experience ringing or noises in your ears? No Do you have difficulty understanding soft or whispered voices? No  Do you feel that you have a problem with memory? No Do you often misplace items? No  Do you feel safe at home? Yes  Cognitive Testing  Alert? Yes Normal Appearance?Yes  Oriented to person? Yes  Place? Yes  Time? Yes  Recall of three objects? Yes  Can perform simple calculations? Yes  Displays appropriate judgment?Yes  Can read the correct time from a watch face?Yes   List the Names of Other Physician/Practitioners you currently use:   Oncology, Orthopedics   Screening Tests / Date Per  Colonoscopy       - exceeded max age               Zostavax - Declined Pneumonia- UTD Mammogram  UTD Influenza Vaccine  DUE Tetanus/tdap - due   ROS:  GEN- denies fatigue, fever, weight loss,weakness, recent illness HEENT- denies eye drainage, change in vision, nasal discharge, CVS- denies chest pain, palpitations RESP- denies SOB, cough, wheeze ABD- denies N/V, change in stools, abd pain GU- denies dysuria, hematuria, dribbling, incontinence MSK- denies joint pain, muscle aches, injury Neuro- denies headache, dizziness, syncope, seizure activity  PHYSICAL: GEN- NAD, alert and oriented x3,walking with cane  HEENT- PERRL, EOMI, non injected sclera, pink conjunctiva, MMM, oropharynx clear Neck- Supple, no thryomegaly CVS- RRR, no murmur RESP-CTAB Sin- no erythema or moisture, mild hyerpigmentation beneath breast, no open lesions ABD-NABS,soft,NT,ND EXT- No edema Pulses- Radial, DP- 2+   Assessment:    Annual wellness medicare exam   Plan:    During the course of the visit the patient was educated and counseled about appropriate screening and preventive services including:  Flu shotWas given Shingles/TDAP-She declines these  Screen + for depression. PHQ- 9 score of 12 (moderate depression).   Discussed advanced directives -Handouts given  HTN-repeat blood pressure is at her baseline which is about 595 systolic no change her medication  She did have labs done about a month ago she does not want a labs today we will obtain her fasting lipid panel at her follow-up  Scars the importance of using her walker to help with her balance and to prevent falls  Intertrigo  beneath her breast is much improved she is now using cornstarch  Daughter was present for the visit as typical Diet review for nutrition referral? Yes ____ Not Indicated __x__  Patient Instructions (the written plan) was given to the patient.  Medicare Attestation  I have personally reviewed:  The patient's medical and social history  Their use of alcohol, tobacco or illicit drugs  Their current medications and supplements  The patient's functional ability including ADLs,fall risks, home safety risks, cognitive, and hearing and visual impairment  Diet and physical activities  Evidence for depression or mood disorders  The patient's weight, height, BMI, and visual acuity have been recorded in the chart. I have made referrals, counseling, and provided education to the patient based on review of the above and I have provided the patient with a written personalized care plan for preventive services.

## 2017-06-02 NOTE — Patient Instructions (Signed)
F/U 4 months  

## 2017-06-24 ENCOUNTER — Other Ambulatory Visit: Payer: Self-pay | Admitting: Family Medicine

## 2017-07-14 ENCOUNTER — Other Ambulatory Visit: Payer: Self-pay | Admitting: *Deleted

## 2017-07-14 MED ORDER — PANTOPRAZOLE SODIUM 40 MG PO TBEC: DELAYED_RELEASE_TABLET | ORAL | 2 refills | Status: DC

## 2017-07-14 MED ORDER — ATORVASTATIN CALCIUM 10 MG PO TABS: ORAL_TABLET | ORAL | 2 refills | Status: DC

## 2017-07-21 ENCOUNTER — Other Ambulatory Visit: Payer: Self-pay | Admitting: *Deleted

## 2017-07-21 MED ORDER — AMLODIPINE BESYLATE 5 MG PO TABS
ORAL_TABLET | ORAL | 2 refills | Status: DC
Start: 2017-07-21 — End: 2017-10-02

## 2017-08-04 ENCOUNTER — Other Ambulatory Visit: Payer: Self-pay | Admitting: *Deleted

## 2017-08-04 MED ORDER — RAMIPRIL 10 MG PO CAPS: 10.0000 mg | ORAL_CAPSULE | Freq: Two times a day (BID) | ORAL | 0 refills | Status: DC

## 2017-08-11 ENCOUNTER — Ambulatory Visit: Payer: Medicare Other | Admitting: Hematology and Oncology

## 2017-08-11 ENCOUNTER — Telehealth: Payer: Self-pay | Admitting: *Deleted
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
## 2017-08-11 NOTE — Assessment & Plan Note (Deleted)
Left Lumpectomy 08/19/16: IDC with DCIS 1.3 cm, DCIS close to 0.1 cm to all margins;ER 100%, PR 100%, Ki-67 5%, HER-2 negative ratio 1.05, T1cNx (Stage 1A) Adj XRT completed 10/20/16  Current treatment: Letrozole 2.5 mg daily started 01/02/2017 Bone density 12/19/2016: Normal T score 0.4  Letrozole toxicities: Denies any hot flashes or myalgias. Slight discomfort in the right hip intermittently.  Hypertension: She will follow with her primary care physician.  Return to clinic in 1 year for follow-up

## 2017-08-11 NOTE — Progress Notes (Deleted)
Patient Care Team: [facility], Modena Nunnery, MD as PCP - General (Family Medicine) Delice Bison, Charlestine Massed, NP as Nurse Practitioner (Hematology and Oncology) Excell Seltzer, MD as Consulting Physician (General Surgery) Nicholas Lose, MD as Consulting Physician (Hematology and Oncology) Kyung Rudd, MD as Consulting Physician (Radiation Oncology)  DIAGNOSIS:  Encounter Diagnosis  Name Primary?  . Malignant neoplasm of upper-outer quadrant of left breast in female, estrogen receptor positive ([place])     SUMMARY OF ONCOLOGIC HISTORY:   Breast cancer of upper-outer quadrant of left female breast ([place])   06/10/2016 Initial Diagnosis    Left breast biopsy 2:00 position: Grade 2 IDC with DCIS, ER 100%, PR 100%, Ki-67 5%, HER-2 negative ratio 1.05, 1.2 cm irregular mass left breast 2:00 position 8 cm from nipple, T1c N0 stage IA clinical stage      08/19/2016 Surgery    Left Lumpectomy: IDC with DCIS 1.3 cm, DCIS close to 0.1 cm to all margins;ER 100%, PR 100%, Ki-67 5%, HER-2 negative ratio 1.05, T1cNx (Stage 1A)      09/22/2016 - 10/20/2016 Radiation Therapy    Radiation [name]): 1)Left breast/ 42.5 Gy in 17 fractions.  2) Left breast boost/ 10 Gy in 4 fractions      12/2016 -  Anti-estrogen oral therapy    Letrozole 2.5 mg daily       CHIEF COMPLIANT: Follow-up on letrozole therapy  INTERVAL HISTORY: Dawn Church is a 83 year old with above-mentioned history of left breast cancer treated with lumpectomy and radiation is currently on letrozole.  She appears to be tolerating it extremely well.  She does not have any major hot flashes or myalgias.  She denies any pain or discomfort in the breast.  She denies any lumps or nodules.  REVIEW OF SYSTEMS:   Constitutional: Denies fevers, chills or abnormal weight loss Eyes: Denies blurriness of vision Ears, nose, mouth, throat, and face: Denies mucositis or sore throat Respiratory: Denies cough, dyspnea or wheezes Cardiovascular: Denies  palpitation, chest discomfort Gastrointestinal:  Denies nausea, heartburn or change in bowel habits Skin: Denies abnormal skin rashes Lymphatics: Denies new lymphadenopathy or easy bruising Neurological:Denies numbness, tingling or new weaknesses Behavioral/Psych: Mood is stable, no new changes  Extremities: No lower extremity edema Breast:  denies any pain or lumps or nodules in either breasts All other systems were reviewed with the patient and are negative.  I have reviewed the past medical history, past surgical history, social history and family history with the patient and they are unchanged from previous note.  ALLERGIES:  is allergic to codeine and ultram [tramadol].  MEDICATIONS:  Current Outpatient Medications  Medication Sig Dispense Refill  . Acetaminophen (APAP ARTHRITIS PO) Take 2 tablets by mouth as needed.    Marland Kitchen amLODipine (NORVASC) 5 MG tablet TAKE 1 TABLET (5 MG TOTAL) BY MOUTH EVERY MORNING. 90 tablet 2  . aspirin EC 81 MG tablet Take 2 tablets (162 mg total) by mouth daily.    Marland Kitchen atorvastatin (LIPITOR) 10 MG tablet TAKE 1 TABLET (10 MG TOTAL) BY MOUTH AT BEDTIME. 90 tablet 2  . fluticasone (FLONASE) 50 MCG/ACT nasal spray Place 2 sprays into both nostrils daily. 48 g 3  . letrozole (FEMARA) 2.5 MG tablet Take 1 tablet (2.5 mg total) by mouth daily. 30 tablet 11  . linaclotide (LINZESS) 72 MCG capsule Take 1 capsule (72 mcg total) by mouth daily before breakfast. (Patient taking differently: Take 72 mcg by mouth daily as needed. ) 30 capsule 3  . Multiple Vitamin (  MULTIVITAMIN) tablet Take 1 tablet by mouth daily.      Marland Kitchen nystatin (MYCOSTATIN/NYSTOP) powder Apply topically 4 (four) times daily. 60 g 2  . omega-3 acid ethyl esters (LOVAZA) 1 g capsule TAKE 1 CAPSULE (1 G TOTAL) BY MOUTH 2 (TWO) TIMES DAILY. 180 capsule 1  . pantoprazole (PROTONIX) 40 MG tablet TAKE 1 TABLET (40 MG TOTAL) BY MOUTH DAILY BEFORE BREAKFAST. 90 tablet 2  . ramipril (ALTACE) 10 MG capsule Take 1  capsule (10 mg total) by mouth 2 (two) times daily. 180 capsule 0  . triamterene-hydrochlorothiazide (MAXZIDE-25) 37.5-25 MG tablet TAKE 1 TABLET BY MOUTH EVERY DAY 90 tablet 0   No current facility-administered medications for this visit.     PHYSICAL EXAMINATION: ECOG PERFORMANCE STATUS: 1 - Symptomatic but completely ambulatory  There were no vitals filed for this visit. There were no vitals filed for this visit.  GENERAL:alert, no distress and comfortable SKIN: skin color, texture, turgor are normal, no rashes or significant lesions EYES: normal, Conjunctiva are pink and non-injected, sclera clear OROPHARYNX:no exudate, no erythema and lips, buccal mucosa, and tongue normal  NECK: supple, thyroid normal size, non-tender, without nodularity LYMPH:  no palpable lymphadenopathy in the cervical, axillary or inguinal LUNGS: clear to auscultation and percussion with normal breathing effort HEART: regular rate & rhythm and no murmurs and no lower extremity edema ABDOMEN:abdomen soft, non-tender and normal bowel sounds MUSCULOSKELETAL:no cyanosis of digits and no clubbing  NEURO: alert & oriented x 3 with fluent speech, no focal motor/sensory deficits EXTREMITIES: No lower extremity edema  LABORATORY DATA:  I have reviewed the data as listed   Chemistry      Component Value Date/Time   NA 144 04/30/2017 1147   NA 144 01/02/2017 1057   K 3.8 04/30/2017 1147   K 3.9 01/02/2017 1057   CL 109 04/30/2017 1147   CO2 20 04/30/2017 1147   CO2 27 01/02/2017 1057   BUN 23 04/30/2017 1147   BUN 15.7 01/02/2017 1057   CREATININE 0.95 (H) 04/30/2017 1147   CREATININE 0.9 01/02/2017 1057      Component Value Date/Time   CALCIUM 10.6 (H) 04/30/2017 1147   CALCIUM 10.7 (H) 01/02/2017 1057   ALKPHOS 67 04/30/2017 1147   ALKPHOS 81 01/02/2017 1057   AST 20 04/30/2017 1147   AST 22 01/02/2017 1057   ALT 11 04/30/2017 1147   ALT 15 01/02/2017 1057   BILITOT 0.4 04/30/2017 1147   BILITOT  0.43 01/02/2017 1057       Lab Results  Component Value Date   WBC 5.3 04/30/2017   HGB 11.5 (L) 04/30/2017   HCT 34.3 (L) 04/30/2017   MCV 101.2 (H) 04/30/2017   PLT 220 04/30/2017   NEUTROABS 3,763 04/30/2017    ASSESSMENT & PLAN:  Breast cancer of upper-outer quadrant of left female breast ([place]) Left Lumpectomy 08/19/16: IDC with DCIS 1.3 cm, DCIS close to 0.1 cm to all margins;ER 100%, PR 100%, Ki-67 5%, HER-2 negative ratio 1.05, T1cNx (Stage 1A) Adj XRT completed 10/20/16  Current treatment: Letrozole 2.5 mg daily started 01/02/2017 Bone density 12/19/2016: Normal T score 0.4  Letrozole toxicities: Denies any hot flashes or myalgias. Slight discomfort in the right hip intermittently.  Hypertension: She will follow with her primary care physician.  Return to clinic in 1 year for follow-up     I spent 25 minutes talking to the patient of which more than half was spent in counseling and coordination of care.  No orders  of the defined types were placed in this encounter.  The patient has a good understanding of the overall plan. she agrees with it. she will call with any problems that may develop before the next visit here.   Rulon Eisenmenger, MD 08/11/17

## 2017-08-11 NOTE — Telephone Encounter (Signed)
Pt is a routine 6 month follow up - message forwarded to scheduling.

## 2017-08-15 NOTE — Assessment & Plan Note (Signed)
Left Lumpectomy 08/19/16: IDC with DCIS 1.3 cm, DCIS close to 0.1 cm to all margins;ER 100%, PR 100%, Ki-67 5%, HER-2 negative ratio 1.05, T1cNx (Stage 1A) Adj XRT completed 10/20/16  Current treatment: Letrozole 2.5 mg daily started 01/02/2017 Bone density 12/19/2016: Normal T score 0.4  Letrozole toxicities: Denies any hot flashes or myalgias. Slight discomfort in the right hip intermittently.  Breast cancer surveillance: Breast exam 08/17/2017: Benign Bone density 12/18/2016 T score 0.4: Normal Mammograms done at [facility]  Hypertension: She will follow with her primary care physician.  Return to clinic in 1 year for follow-up

## 2017-08-17 ENCOUNTER — Telehealth: Payer: Self-pay

## 2017-08-17 ENCOUNTER — Ambulatory Visit (HOSPITAL_BASED_OUTPATIENT_CLINIC_OR_DEPARTMENT_OTHER): Payer: Medicare Other | Admitting: Hematology and Oncology

## 2017-08-17 DIAGNOSIS — Z170 Estrogen receptor positive status [ER+]: Secondary | ICD-10-CM

## 2017-08-17 DIAGNOSIS — C50412 Malignant neoplasm of upper-outer quadrant of left female breast: Secondary | ICD-10-CM | POA: Diagnosis not present

## 2017-08-17 DIAGNOSIS — Z79811 Long term (current) use of aromatase inhibitors: Secondary | ICD-10-CM

## 2017-08-17 MED ORDER — LETROZOLE 2.5 MG PO TABS: 2.5000 mg | ORAL_TABLET | Freq: Every day | ORAL | 3 refills | Status: DC

## 2017-08-17 NOTE — Progress Notes (Signed)
Patient Care Team: [facility], Modena Nunnery, MD as PCP - General (Family Medicine) Delice Bison, Charlestine Massed, NP as Nurse Practitioner (Hematology and Oncology) Excell Seltzer, MD as Consulting Physician (General Surgery) Nicholas Lose, MD as Consulting Physician (Hematology and Oncology) Kyung Rudd, MD as Consulting Physician (Radiation Oncology)  DIAGNOSIS:  Encounter Diagnosis  Name Primary?  . Malignant neoplasm of upper-outer quadrant of left breast in female, estrogen receptor positive ([place])     SUMMARY OF ONCOLOGIC HISTORY:   Breast cancer of upper-outer quadrant of left female breast ([place])   06/10/2016 Initial Diagnosis    Left breast biopsy 2:00 position: Grade 2 IDC with DCIS, ER 100%, PR 100%, Ki-67 5%, HER-2 negative ratio 1.05, 1.2 cm irregular mass left breast 2:00 position 8 cm from nipple, T1c N0 stage IA clinical stage      08/19/2016 Surgery    Left Lumpectomy: IDC with DCIS 1.3 cm, DCIS close to 0.1 cm to all margins;ER 100%, PR 100%, Ki-67 5%, HER-2 negative ratio 1.05, T1cNx (Stage 1A)      09/22/2016 - 10/20/2016 Radiation Therapy    Radiation [name]): 1)Left breast/ 42.5 Gy in 17 fractions.  2) Left breast boost/ 10 Gy in 4 fractions      12/2016 -  Anti-estrogen oral therapy    Letrozole 2.5 mg daily       CHIEF COMPLIANT: Follow-up on letrozole therapy  INTERVAL HISTORY: Dawn Church is a 83 year old with above-mentioned history of left breast cancer treated with lumpectomy and radiation is currently on letrozole therapy.  She has been tolerating the treatment fairly well.  Her daughter is complaining that she does not do much walking and that makes her weaker.  She denies any hot flashes or myalgias.  REVIEW OF SYSTEMS:   Constitutional: Denies fevers, chills or abnormal weight loss Eyes: Denies blurriness of vision Ears, nose, mouth, throat, and face: Denies mucositis or sore throat Respiratory: Denies cough, dyspnea or wheezes Cardiovascular:  Denies palpitation, chest discomfort Gastrointestinal:  Denies nausea, heartburn or change in bowel habits Skin: Denies abnormal skin rashes Lymphatics: Denies new lymphadenopathy or easy bruising Neurological:Denies numbness, tingling or new weaknesses Behavioral/Psych: Mood is stable, no new changes  Extremities: No lower extremity edema Breast:  denies any pain or lumps or nodules in either breasts All other systems were reviewed with the patient and are negative.  I have reviewed the past medical history, past surgical history, social history and family history with the patient and they are unchanged from previous note.  ALLERGIES:  is allergic to codeine and ultram [tramadol].  MEDICATIONS:  Current Outpatient Medications  Medication Sig Dispense Refill  . Acetaminophen (APAP ARTHRITIS PO) Take 2 tablets by mouth as needed.    Marland Kitchen amLODipine (NORVASC) 5 MG tablet TAKE 1 TABLET (5 MG TOTAL) BY MOUTH EVERY MORNING. 90 tablet 2  . aspirin EC 81 MG tablet Take 2 tablets (162 mg total) by mouth daily.    Marland Kitchen atorvastatin (LIPITOR) 10 MG tablet TAKE 1 TABLET (10 MG TOTAL) BY MOUTH AT BEDTIME. 90 tablet 2  . fluticasone (FLONASE) 50 MCG/ACT nasal spray Place 2 sprays into both nostrils daily. 48 g 3  . letrozole (FEMARA) 2.5 MG tablet Take 1 tablet (2.5 mg total) by mouth daily. 30 tablet 11  . linaclotide (LINZESS) 72 MCG capsule Take 1 capsule (72 mcg total) by mouth daily before breakfast. (Patient taking differently: Take 72 mcg by mouth daily as needed. ) 30 capsule 3  . Multiple Vitamin (MULTIVITAMIN) tablet  Take 1 tablet by mouth daily.      Marland Kitchen nystatin (MYCOSTATIN/NYSTOP) powder Apply topically 4 (four) times daily. 60 g 2  . omega-3 acid ethyl esters (LOVAZA) 1 g capsule TAKE 1 CAPSULE (1 G TOTAL) BY MOUTH 2 (TWO) TIMES DAILY. 180 capsule 1  . pantoprazole (PROTONIX) 40 MG tablet TAKE 1 TABLET (40 MG TOTAL) BY MOUTH DAILY BEFORE BREAKFAST. 90 tablet 2  . ramipril (ALTACE) 10 MG capsule  Take 1 capsule (10 mg total) by mouth 2 (two) times daily. 180 capsule 0  . triamterene-hydrochlorothiazide (MAXZIDE-25) 37.5-25 MG tablet TAKE 1 TABLET BY MOUTH EVERY DAY 90 tablet 0   No current facility-administered medications for this visit.     PHYSICAL EXAMINATION: ECOG PERFORMANCE STATUS: 1 - Symptomatic but completely ambulatory  Vitals:   08/17/17 1357  BP: (!) 160/69  Pulse: 85  Resp: 18  Temp: 98.4 F (36.9 C)  SpO2: 98%   Filed Weights   08/17/17 1357  Weight: 165 lb (74.8 kg)    GENERAL:alert, no distress and comfortable SKIN: skin color, texture, turgor are normal, no rashes or significant lesions EYES: normal, Conjunctiva are pink and non-injected, sclera clear OROPHARYNX:no exudate, no erythema and lips, buccal mucosa, and tongue normal  NECK: supple, thyroid normal size, non-tender, without nodularity LYMPH:  no palpable lymphadenopathy in the cervical, axillary or inguinal LUNGS: clear to auscultation and percussion with normal breathing effort HEART: regular rate & rhythm and no murmurs and no lower extremity edema ABDOMEN:abdomen soft, non-tender and normal bowel sounds MUSCULOSKELETAL:no cyanosis of digits and no clubbing  NEURO: alert & oriented x 3 with fluent speech, no focal motor/sensory deficits EXTREMITIES: No lower extremity edema  LABORATORY DATA:  I have reviewed the data as listed   Chemistry      Component Value Date/Time   NA 144 04/30/2017 1147   NA 144 01/02/2017 1057   K 3.8 04/30/2017 1147   K 3.9 01/02/2017 1057   CL 109 04/30/2017 1147   CO2 20 04/30/2017 1147   CO2 27 01/02/2017 1057   BUN 23 04/30/2017 1147   BUN 15.7 01/02/2017 1057   CREATININE 0.95 (H) 04/30/2017 1147   CREATININE 0.9 01/02/2017 1057      Component Value Date/Time   CALCIUM 10.6 (H) 04/30/2017 1147   CALCIUM 10.7 (H) 01/02/2017 1057   ALKPHOS 67 04/30/2017 1147   ALKPHOS 81 01/02/2017 1057   AST 20 04/30/2017 1147   AST 22 01/02/2017 1057   ALT  11 04/30/2017 1147   ALT 15 01/02/2017 1057   BILITOT 0.4 04/30/2017 1147   BILITOT 0.43 01/02/2017 1057       Lab Results  Component Value Date   WBC 5.3 04/30/2017   HGB 11.5 (L) 04/30/2017   HCT 34.3 (L) 04/30/2017   MCV 101.2 (H) 04/30/2017   PLT 220 04/30/2017   NEUTROABS 3,763 04/30/2017    ASSESSMENT & PLAN:  Breast cancer of upper-outer quadrant of left female breast ([place]) Left Lumpectomy 08/19/16: IDC with DCIS 1.3 cm, DCIS close to 0.1 cm to all margins;ER 100%, PR 100%, Ki-67 5%, HER-2 negative ratio 1.05, T1cNx (Stage 1A) Adj XRT completed 10/20/16  Current treatment: Letrozole 2.5 mg daily started 01/02/2017 Bone density 12/19/2016: Normal T score 0.4  Letrozole toxicities: Denies any hot flashes or myalgias. Slight discomfort in the right hip intermittently.  Breast cancer surveillance: Breast exam 08/17/2017: Benign Bone density 12/18/2016 T score 0.4: Normal Mammograms done at [facility]  Hypertension: She will follow with  her primary care physician.  Return to clinic in 1 year for follow-up   I spent 25 minutes talking to the patient of which more than half was spent in counseling and coordination of care.  No orders of the defined types were placed in this encounter.  The patient has a good understanding of the overall plan. she agrees with it. she will call with any problems that may develop before the next visit here.   Rulon Eisenmenger, MD 08/17/17

## 2017-08-17 NOTE — Telephone Encounter (Signed)
appts made and avs pritned for pt per 12//17/18 los

## 2017-09-21 ENCOUNTER — Other Ambulatory Visit: Payer: Self-pay | Admitting: Family Medicine

## 2017-10-02 ENCOUNTER — Encounter: Payer: Self-pay | Admitting: Family Medicine

## 2017-10-02 ENCOUNTER — Ambulatory Visit: Payer: Medicare Other | Admitting: Family Medicine

## 2017-10-02 VITALS — BP 154/68 | HR 68 | Temp 98.7°F | Resp 14 | Ht 63.0 in | Wt 165.0 lb

## 2017-10-02 DIAGNOSIS — E7800 Pure hypercholesterolemia, unspecified: Secondary | ICD-10-CM

## 2017-10-02 DIAGNOSIS — I10 Essential (primary) hypertension: Secondary | ICD-10-CM | POA: Diagnosis not present

## 2017-10-02 DIAGNOSIS — Z8673 Personal history of transient ischemic attack (TIA), and cerebral infarction without residual deficits: Secondary | ICD-10-CM

## 2017-10-02 DIAGNOSIS — R2681 Unsteadiness on feet: Secondary | ICD-10-CM | POA: Diagnosis not present

## 2017-10-02 DIAGNOSIS — K5901 Slow transit constipation: Secondary | ICD-10-CM | POA: Diagnosis not present

## 2017-10-02 LAB — CBC WITH DIFFERENTIAL/PLATELET
Basophils Absolute: 19 cells/uL (ref 0–200)
Basophils Relative: 0.3 %
Eosinophils Absolute: 38 cells/uL (ref 15–500)
Eosinophils Relative: 0.6 %
HEMATOCRIT: 34.4 % — AB (ref 35.0–45.0)
HEMOGLOBIN: 11.7 g/dL (ref 11.7–15.5)
LYMPHS ABS: 1046 {cells}/uL (ref 850–3900)
MCH: 33 pg (ref 27.0–33.0)
MCHC: 34 g/dL (ref 32.0–36.0)
MCV: 96.9 fL (ref 80.0–100.0)
MONOS PCT: 7 %
MPV: 9.7 fL (ref 7.5–12.5)
NEUTROS ABS: 4757 {cells}/uL (ref 1500–7800)
Neutrophils Relative %: 75.5 %
Platelets: 217 10*3/uL (ref 140–400)
RBC: 3.55 10*6/uL — AB (ref 3.80–5.10)
RDW: 13.1 % (ref 11.0–15.0)
Total Lymphocyte: 16.6 %
WBC mixed population: 441 cells/uL (ref 200–950)
WBC: 6.3 10*3/uL (ref 3.8–10.8)

## 2017-10-02 LAB — LIPID PANEL
Cholesterol: 180 mg/dL (ref <200)
HDL: 72 mg/dL (ref >50)
LDL CHOLESTEROL (CALC): 81 mg/dL
NON-HDL CHOLESTEROL (CALC): 108 mg/dL (ref <130)
Total CHOL/HDL Ratio: 2.5 (calc) (ref <5.0)
Triglycerides: 177 mg/dL — ABNORMAL HIGH (ref <150)

## 2017-10-02 LAB — COMPREHENSIVE METABOLIC PANEL
AG RATIO: 1.6 (calc) (ref 1.0–2.5)
ALBUMIN MSPROF: 4.1 g/dL (ref 3.6–5.1)
ALT: 10 U/L (ref 6–29)
AST: 18 U/L (ref 10–35)
Alkaline phosphatase (APISO): 70 U/L (ref 33–130)
BILIRUBIN TOTAL: 0.5 mg/dL (ref 0.2–1.2)
BUN: 16 mg/dL (ref 7–25)
CALCIUM: 10.9 mg/dL — AB (ref 8.6–10.4)
CO2: 25 mmol/L (ref 20–32)
Chloride: 105 mmol/L (ref 98–110)
Creat: 0.87 mg/dL (ref 0.60–0.88)
GLUCOSE: 110 mg/dL — AB (ref 65–99)
Globulin: 2.5 g/dL (calc) (ref 1.9–3.7)
POTASSIUM: 4 mmol/L (ref 3.5–5.3)
SODIUM: 142 mmol/L (ref 135–146)
TOTAL PROTEIN: 6.6 g/dL (ref 6.1–8.1)

## 2017-10-02 MED ORDER — RAMIPRIL 10 MG PO CAPS: 10.0000 mg | ORAL_CAPSULE | Freq: Two times a day (BID) | ORAL | 2 refills | Status: DC

## 2017-10-02 MED ORDER — TRIAMTERENE-HCTZ 37.5-25 MG PO TABS: 1.0000 | ORAL_TABLET | Freq: Every day | ORAL | 2 refills | Status: DC

## 2017-10-02 MED ORDER — PANTOPRAZOLE SODIUM 40 MG PO TBEC: DELAYED_RELEASE_TABLET | ORAL | 2 refills | Status: DC

## 2017-10-02 MED ORDER — LINACLOTIDE 72 MCG PO CAPS: 72.0000 ug | ORAL_CAPSULE | Freq: Every day | ORAL | 3 refills | Status: DC

## 2017-10-02 MED ORDER — ATORVASTATIN CALCIUM 10 MG PO TABS: ORAL_TABLET | ORAL | 2 refills | Status: DC

## 2017-10-02 MED ORDER — AMLODIPINE BESYLATE 5 MG PO TABS: ORAL_TABLET | ORAL | 2 refills | Status: DC

## 2017-10-02 NOTE — Assessment & Plan Note (Signed)
Restart Linzess okay to use the prune juice as well

## 2017-10-02 NOTE — Assessment & Plan Note (Signed)
Has some generalized weakness but she is using her rolling walker which is help with her gait.

## 2017-10-02 NOTE — Patient Instructions (Signed)
F/U June

## 2017-10-02 NOTE — Progress Notes (Signed)
   Subjective:    Patient ID: Dawn Church, female    DOB: 1928-02-23, 84 y.o.   MRN: [id number]  Patient presents for Follow-up (is not fasting)  Pt here to f/u chronic medical problems Medications reviewed Appetite is good, maintaining weight  Continues to follow with onclogy for her breast cancer she is on Femara Mild hypercalcemia at last check advised to stop supplements, due for repeat    [place] off the bed week before Thanksgiving - states she slid to the ground off the bed, was alone, unable to get to the phone, she stayed in the floor all night, had elbow abrasion  Daughter has moved in now   HTN- taking bp meds as pescribed   Podiatry for nail trimming   Constipation- using pruine juice out of linzess   No NEW CONCERNS  Review Of Systems:  GEN- denies fatigue, fever, weight loss,weakness, recent illness HEENT- denies eye drainage, change in vision, nasal discharge, CVS- denies chest pain, palpitations RESP- denies SOB, cough, wheeze ABD- denies N/V, change in stools, abd pain GU- denies dysuria, hematuria, dribbling, incontinence MSK- denies joint pain, muscle aches, injury Neuro- denies headache, dizziness, syncope, seizure activity       Objective:    BP (!) 154/68   Pulse 68   Temp 98.7 F (37.1 C) (Oral)   Resp 14   Ht 5\' 3"  (1.6 m)   Wt 165 lb (74.8 kg)   LMP  (LMP Unknown)   SpO2 94%   BMI 29.23 kg/m  GEN- NAD, alert and oriented x3 HEENT- PERRL, EOMI, non injected sclera, pink conjunctiva, MMM, oropharynx clear Neck- Supple, no thyromegaly CVS- RRR, no murmur RESP-CTAB ABD-NABS,soft,NT,ND Skin in tact, beneath breast no erythema has moles, Seb Keratosis EXT- No edema Pulses- Radial, DP- 2+        Assessment & Plan:      Problem List Items Addressed This Visit      Unprioritized   Hyperlipidemia   Relevant Orders   Lipid panel   History of stroke    Has some generalized weakness but she is using her rolling walker which is help  with her gait.      Gait instability    Her children now stay with her.  Also recommend that she get a life alert bracelet.  Her daughter was with her today is going to look into this.      Essential hypertension, benign - Primary    Blood pressures at baseline no change in medications.  Labs to be done today fasting.      Relevant Orders   CBC with Differential/Platelet   Comprehensive metabolic panel   Constipation    Restart Linzess okay to use the prune juice as well         Note: This dictation was prepared with Dragon dictation along with smaller phrase technology. Any transcriptional errors that result from this process are unintentional.

## 2017-10-02 NOTE — Assessment & Plan Note (Signed)
Blood pressures at baseline no change in medications.  Labs to be done today fasting.

## 2017-10-02 NOTE — Assessment & Plan Note (Signed)
Her children now stay with her.  Also recommend that she get a life alert bracelet.  Her daughter was with her today is going to look into this.

## 2017-10-07 ENCOUNTER — Other Ambulatory Visit: Payer: Self-pay | Admitting: *Deleted

## 2017-10-14 ENCOUNTER — Other Ambulatory Visit: Payer: Self-pay

## 2017-10-14 ENCOUNTER — Encounter (HOSPITAL_COMMUNITY): Payer: Self-pay

## 2017-10-14 ENCOUNTER — Emergency Department (HOSPITAL_COMMUNITY)
Admission: EM | Admit: 2017-10-14 | Discharge: 2017-10-14 | Discharge status: Against Medical Advice | Payer: Medicare Other | Attending: Emergency Medicine | Admitting: Emergency Medicine

## 2017-10-14 DIAGNOSIS — R32 Unspecified urinary incontinence: Secondary | ICD-10-CM | POA: Insufficient documentation

## 2017-10-14 DIAGNOSIS — Z5321 Procedure and treatment not carried out due to patient leaving prior to being seen by health care provider: Secondary | ICD-10-CM | POA: Diagnosis not present

## 2017-10-14 NOTE — ED Notes (Signed)
Per registration, patient left ED.  ?

## 2017-10-14 NOTE — ED Triage Notes (Signed)
Patient reports of urinary incontinence since Saturday. Denies dysuria/foul odor to urine.

## 2017-10-16 ENCOUNTER — Encounter: Payer: Self-pay | Admitting: Family Medicine

## 2017-10-16 ENCOUNTER — Ambulatory Visit (INDEPENDENT_AMBULATORY_CARE_PROVIDER_SITE_OTHER): Payer: Medicare Other | Admitting: Family Medicine

## 2017-10-16 VITALS — BP 160/78 | HR 90 | Temp 98.9°F | Resp 18 | Ht 63.0 in | Wt 165.0 lb

## 2017-10-16 DIAGNOSIS — N390 Urinary tract infection, site not specified: Secondary | ICD-10-CM

## 2017-10-16 LAB — MICROSCOPIC MESSAGE

## 2017-10-16 LAB — URINALYSIS, ROUTINE W REFLEX MICROSCOPIC
BACTERIA UA: NONE SEEN /HPF
Bilirubin Urine: NEGATIVE
Glucose, UA: NEGATIVE
Hgb urine dipstick: NEGATIVE
Ketones, ur: NEGATIVE
NITRITE: NEGATIVE
PROTEIN: NEGATIVE
RBC / HPF: NONE SEEN /HPF (ref 0–2)
Specific Gravity, Urine: 1.015 (ref 1.001–1.03)
pH: 6.5 (ref 5.0–8.0)

## 2017-10-16 MED ORDER — FLUTICASONE PROPIONATE 50 MCG/ACT NA SUSP: 2.0000 | Freq: Every day | NASAL | 3 refills | Status: DC

## 2017-10-16 MED ORDER — CEPHALEXIN 500 MG PO CAPS: 500.0000 mg | ORAL_CAPSULE | Freq: Four times a day (QID) | ORAL | 0 refills | Status: DC

## 2017-10-16 NOTE — Progress Notes (Signed)
   Subjective:    Patient ID: Dawn Church, female    DOB: August 24, 1928, 84 y.o.   MRN: [id number]  Patient presents for Dysuria   Dysuria and urinary frequency sthat started Sunday , no fever no chills She has had to wear incontinence pads/diapers past few days  Taking the linzess for her constipation  Still eating and drinking well    She went to ER Wed but wait was so long she left   Review Of Systems:  GEN- denies fatigue, fever, weight loss,weakness, recent illness HEENT- denies eye drainage, change in vision, nasal discharge, CVS- denies chest pain, palpitations RESP- denies SOB, cough, wheeze ABD- denies N/V, change in stools, abd pain GU-+dysuria, hematuria, dribbling, incontinence MSK- denies joint pain, muscle aches, injury Neuro- denies headache, dizziness, syncope, seizure activity       Objective:    BP (!) 160/78   Pulse 90   Temp 98.9 F (37.2 C)   Resp 18   Ht 5\' 3"  (1.6 m)   Wt 165 lb (74.8 kg)   LMP  (LMP Unknown)   SpO2 95%   BMI 29.23 kg/m  GEN- NAD, alert and oriented x3 HEENT- PERRL, EOMI, non injected sclera, pink conjunctiva, MMM, oropharynx clear CVS- RRR, no murmur RESP-CTAB ABD-NABS,soft,NT,ND, no CVA tenderness  EXT- No edema Pulses- Radial  2+        Assessment & Plan:      Problem List Items Addressed This Visit    None    Visit Diagnoses    Urinary tract infection without hematuria, site unspecified    -  Primary   Treat with keflex, culture sent, push fluids, no fever, no pain,   Relevant Medications   cephALEXin (KEFLEX) 500 MG capsule   Other Relevant Orders   Urinalysis, Routine w reflex microscopic (Completed)   Urine Culture      Note: This dictation was prepared with Dragon dictation along with smaller phrase technology. Any transcriptional errors that result from this process are unintentional.

## 2017-10-16 NOTE — Patient Instructions (Signed)
F/U as previous 

## 2017-10-17 LAB — URINE CULTURE
MICRO NUMBER: 90205360
SPECIMEN QUALITY: ADEQUATE

## 2017-11-25 ENCOUNTER — Telehealth: Payer: Self-pay | Admitting: *Deleted

## 2017-11-25 ENCOUNTER — Other Ambulatory Visit: Payer: Medicare Other

## 2017-11-25 DIAGNOSIS — R300 Dysuria: Secondary | ICD-10-CM

## 2017-11-25 LAB — URINALYSIS, ROUTINE W REFLEX MICROSCOPIC
BACTERIA UA: NONE SEEN /HPF
Bilirubin Urine: NEGATIVE
Glucose, UA: NEGATIVE
HGB URINE DIPSTICK: NEGATIVE
KETONES UR: NEGATIVE
NITRITE: NEGATIVE
PROTEIN: NEGATIVE
RBC / HPF: NONE SEEN /HPF (ref 0–2)
Specific Gravity, Urine: 1.02 (ref 1.001–1.03)
pH: 5.5 (ref 5.0–8.0)

## 2017-11-25 LAB — MICROSCOPIC MESSAGE

## 2017-11-25 NOTE — Telephone Encounter (Signed)
Received call from patient daughter, Wynn Banker.   Reports that patient is having Sx of UTI again and requested MD to advise. States that MD did state that she did would not require an OV.   MD made aware and states that patient will need to bring in urine sample for UA, but OV will not be required.   Call placed to patient and patient made aware per VM.

## 2017-11-26 LAB — URINE CULTURE
MICRO NUMBER:: 90382439
SPECIMEN QUALITY: ADEQUATE

## 2017-12-30 ENCOUNTER — Ambulatory Visit: Payer: Medicare Other | Admitting: Family Medicine

## 2017-12-30 ENCOUNTER — Encounter: Payer: Self-pay | Admitting: Family Medicine

## 2017-12-30 ENCOUNTER — Other Ambulatory Visit: Payer: Self-pay

## 2017-12-30 VITALS — BP 142/72 | HR 84 | Temp 98.7°F | Resp 12 | Ht 63.0 in | Wt 163.0 lb

## 2017-12-30 DIAGNOSIS — R2681 Unsteadiness on feet: Secondary | ICD-10-CM

## 2017-12-30 DIAGNOSIS — M5136 Other intervertebral disc degeneration, lumbar region: Secondary | ICD-10-CM

## 2017-12-30 DIAGNOSIS — M51369 Other intervertebral disc degeneration, lumbar region without mention of lumbar back pain or lower extremity pain: Secondary | ICD-10-CM

## 2017-12-30 DIAGNOSIS — M159 Polyosteoarthritis, unspecified: Secondary | ICD-10-CM

## 2017-12-30 MED ORDER — DICLOFENAC SODIUM 1 % TD GEL: TRANSDERMAL | 2 refills | Status: DC

## 2017-12-30 NOTE — Assessment & Plan Note (Signed)
Has known generalized osteoarthritis as well as degenerative disc disease on her spine.  These are chronic problems that she has complained about 4 years that tend to flareup.  I think she can continue the Tylenol also try topical Voltaren gel.  She has not had any recent falls.  We will have her try this for a few weeks before getting any repeat imaging on these same areas.  We also discussed physical therapy to try to help strengthen her legs are mobile within her home.  She is willing to try this.  She declines any orthopedic intervention

## 2017-12-30 NOTE — Patient Instructions (Addendum)
Try the topical  Home Physical Therapy  F/U as previous

## 2017-12-30 NOTE — Progress Notes (Signed)
   Subjective:    Patient ID: Dawn Church, female    DOB: 15-Mar-1928, 84 y.o.   MRN: [id number]  Patient presents for R Sided Pain (reports that pain goes from heel all the way up to her back)  Here with right hip pain that radiates down her leg to her knee as well as knee pain.  These are chronic issues for her.  She has known degenerative disc in her spine as well as arthritis in the hip in her knees.  Was seen by orthopedics years ago.  She has taken some Tylenol and also using mechanical topical anti-inflammatory which helps some.  Denies any tingling or numbness in the foot.  She has not had any falls that she walks with her walker to help with stability.  She feels weak on that side especially when she has pain.  States that is been worse over the past few months.  She did however say that she did not want any surgery does not want to see orthopedics and does not want steroid injections does not want to take any more pills to help.  Going to stop the Linzess she states that the pharmacist told her that this was not for constipation?.  Says she is drinking prune juice and taking an over-the-counter laxative.    Review Of Systems:  GEN- denies fatigue, fever, weight loss,weakness, recent illness HEENT- denies eye drainage, change in vision, nasal discharge, CVS- denies chest pain, palpitations RESP- denies SOB, cough, wheeze ABD- denies N/V, change in stools, abd pain GU- denies dysuria, hematuria, dribbling, incontinence MSK- +joint pain, muscle aches, injury Neuro- denies headache, dizziness, syncope, seizure activity       Objective:    BP (!) 142/72   Pulse 84   Temp 98.7 F (37.1 C) (Oral)   Resp 12   Ht 5\' 3"  (1.6 m)   Wt 163 lb (73.9 kg)   LMP  (LMP Unknown)   SpO2 96%   BMI 28.87 kg/m  GEN- NAD, alert and oriented x3 HEENT- PERRL, EOMI, non injected sclera, pink conjunctiva, MMM, oropharynx clear CVS- RRR, no murmur RESP-CTAB ABD-NABS,soft,NT,ND MSK- Spine  NT, decreased ROM HIPS/SPINE/KNEES, MIld crepitus knees no effusion, TTP over right hip, walks with rolling walker  neuro- decreased strength bilat LE, right 2/2 pain ,sensation in tact  EXT- No edema Pulses- Radial, 2+        Assessment & Plan:      Problem List Items Addressed This Visit      Unprioritized   Generalized OA - Primary   Gait instability   DDD (degenerative disc disease), lumbar    Has known generalized osteoarthritis as well as degenerative disc disease on her spine.  These are chronic problems that she has complained about 4 years that tend to flareup.  I think she can continue the Tylenol also try topical Voltaren gel.  She has not had any recent falls.  We will have her try this for a few weeks before getting any repeat imaging on these same areas.  We also discussed physical therapy to try to help strengthen her legs are mobile within her home.  She is willing to try this.  She declines any orthopedic intervention         Note: This dictation was prepared with Dragon dictation along with smaller phrase technology. Any transcriptional errors that result from this process are unintentional.

## 2018-01-04 ENCOUNTER — Telehealth: Payer: Self-pay | Admitting: *Deleted

## 2018-01-04 NOTE — Telephone Encounter (Signed)
Received request from pharmacy for PA on Diclofenac.  PA submitted.   Dx: M15.9- OA

## 2018-01-04 NOTE — Telephone Encounter (Signed)
[facility] is reviewing your PA request. Typically an electronic response will be received within 72 hours.

## 2018-01-05 ENCOUNTER — Telehealth: Payer: Self-pay | Admitting: *Deleted

## 2018-01-05 MED ORDER — DICLOFENAC SODIUM 1 % TD GEL: TRANSDERMAL | 2 refills | Status: DC

## 2018-01-05 NOTE — Telephone Encounter (Signed)
Received PA determination.   PA approved through 08/31/2018.  Pharmacy made aware.

## 2018-01-05 NOTE — Telephone Encounter (Signed)
Received call from Amy, [facility] PT with [place]. [phone]~ telephone.   Requested order to extend [facility] PT services 2x weekly x3 weeks for gait, balance and strengthening. VO given.   MD to be made aware.

## 2018-01-05 NOTE — Telephone Encounter (Signed)
noted 

## 2018-02-01 ENCOUNTER — Ambulatory Visit: Payer: Medicare Other | Admitting: Family Medicine

## 2018-02-01 ENCOUNTER — Encounter: Payer: Self-pay | Admitting: Family Medicine

## 2018-02-01 ENCOUNTER — Other Ambulatory Visit: Payer: Self-pay

## 2018-02-01 VITALS — BP 124/68 | HR 78 | Temp 98.3°F | Resp 16 | Ht 63.0 in | Wt 164.0 lb

## 2018-02-01 DIAGNOSIS — R2681 Unsteadiness on feet: Secondary | ICD-10-CM | POA: Diagnosis not present

## 2018-02-01 DIAGNOSIS — M159 Polyosteoarthritis, unspecified: Secondary | ICD-10-CM

## 2018-02-01 DIAGNOSIS — I10 Essential (primary) hypertension: Secondary | ICD-10-CM

## 2018-02-01 LAB — CBC WITH DIFFERENTIAL/PLATELET
BASOS PCT: 0.5 %
Basophils Absolute: 39 cells/uL (ref 0–200)
EOS PCT: 1.2 %
Eosinophils Absolute: 94 cells/uL (ref 15–500)
HCT: 34.7 % — ABNORMAL LOW (ref 35.0–45.0)
HEMOGLOBIN: 11.9 g/dL (ref 11.7–15.5)
Lymphs Abs: 1615 cells/uL (ref 850–3900)
MCH: 33.8 pg — ABNORMAL HIGH (ref 27.0–33.0)
MCHC: 34.3 g/dL (ref 32.0–36.0)
MCV: 98.6 fL (ref 80.0–100.0)
MPV: 9.5 fL (ref 7.5–12.5)
Monocytes Relative: 6.8 %
NEUTROS ABS: 5522 {cells}/uL (ref 1500–7800)
Neutrophils Relative %: 70.8 %
Platelets: 232 10*3/uL (ref 140–400)
RBC: 3.52 10*6/uL — ABNORMAL LOW (ref 3.80–5.10)
RDW: 12.8 % (ref 11.0–15.0)
Total Lymphocyte: 20.7 %
WBC mixed population: 530 cells/uL (ref 200–950)
WBC: 7.8 10*3/uL (ref 3.8–10.8)

## 2018-02-01 LAB — BASIC METABOLIC PANEL
BUN: 20 mg/dL (ref 7–25)
CALCIUM: 10.6 mg/dL — AB (ref 8.6–10.4)
CO2: 29 mmol/L (ref 20–32)
Chloride: 104 mmol/L (ref 98–110)
Creat: 0.84 mg/dL (ref 0.60–0.88)
GLUCOSE: 133 mg/dL — AB (ref 65–99)
Potassium: 3.6 mmol/L (ref 3.5–5.3)
SODIUM: 142 mmol/L (ref 135–146)

## 2018-02-01 NOTE — Progress Notes (Signed)
   Subjective:    Patient ID: Dawn Church, female    DOB: [birth year], 84 y.o.   MRN: [id number]  Patient presents for Follow-up (is not fasting)  Pt here for intermin f/u   She had PT for 3 weeks, [place] is comting this    OA/ Back pain- using voltaren gel     HTN- taking BP meds as prescribed   Appeitte ip and down, drinking boost at least once a day , no weight loss  Here with daughter       Review Of Systems:  GEN- denies fatigue, fever, weight loss,weakness, recent illness HEENT- denies eye drainage, change in vision, nasal discharge, CVS- denies chest pain, palpitations RESP- denies SOB, cough, wheeze ABD- denies N/V, change in stools, abd pain GU- denies dysuria, hematuria, dribbling, incontinence MSK- + joint pain, muscle aches, injury Neuro- denies headache, dizziness, syncope, seizure activity       Objective:    BP 124/68   Pulse 78   Temp 98.3 F (36.8 C) (Oral)   Resp 16   Ht 5\' 3"  (1.6 m)   Wt 164 lb (74.4 kg)   LMP  (LMP Unknown)   SpO2 97%   BMI 29.05 kg/m  GEN- NAD, alert and oriented x3,walkswith rolling walker  HEENT- PERRL, EOMI, non injected sclera, pink conjunctiva, MMM, oropharynx clear CVS- RRR, no murmur RESP-CTAB ABD-NABS,soft,NT,ND EXT- No edema Pulses- Radial 2+        Assessment & Plan:      Problem List Items Addressed This Visit      Unprioritized   Generalized OA    Declines orthopedics at this time      Gait instability    She has completed PT Will continue topical voltaren and tylenol for pain      Essential hypertension, benign - Primary    BP looks good, no changes      Relevant Orders   CBC with Differential/Platelet   Basic metabolic panel    Other Visit Diagnoses    Hypercalcemia       Relevant Orders   Basic metabolic panel      Note: This dictation was prepared with Dragon dictation along with smaller phrase technology. Any transcriptional errors that result from this process are  unintentional.

## 2018-02-01 NOTE — Assessment & Plan Note (Signed)
She has completed PT Will continue topical voltaren and tylenol for pain

## 2018-02-01 NOTE — Assessment & Plan Note (Signed)
BP looks good, no changes 

## 2018-02-01 NOTE — Patient Instructions (Addendum)
Ask about TDAP see if covered We will call with lab results  Continue the voltaren gel F/U 4 months Leisa

## 2018-02-01 NOTE — Assessment & Plan Note (Signed)
Declines orthopedics at this time

## 2018-02-04 ENCOUNTER — Encounter: Payer: Self-pay | Admitting: *Deleted

## 2018-03-11 ENCOUNTER — Other Ambulatory Visit: Payer: Self-pay | Admitting: Family Medicine

## 2018-05-19 ENCOUNTER — Other Ambulatory Visit: Payer: Self-pay | Admitting: Family Medicine

## 2018-06-03 ENCOUNTER — Ambulatory Visit (INDEPENDENT_AMBULATORY_CARE_PROVIDER_SITE_OTHER): Payer: Medicare Other | Admitting: Family Medicine

## 2018-06-03 ENCOUNTER — Encounter: Payer: Self-pay | Admitting: Family Medicine

## 2018-06-03 ENCOUNTER — Other Ambulatory Visit: Payer: Self-pay

## 2018-06-03 VITALS — BP 120/68 | HR 66 | Temp 98.2°F | Resp 16 | Ht 63.0 in | Wt 163.0 lb

## 2018-06-03 DIAGNOSIS — M159 Polyosteoarthritis, unspecified: Secondary | ICD-10-CM

## 2018-06-03 DIAGNOSIS — Z23 Encounter for immunization: Secondary | ICD-10-CM

## 2018-06-03 DIAGNOSIS — I10 Essential (primary) hypertension: Secondary | ICD-10-CM

## 2018-06-03 DIAGNOSIS — R7309 Other abnormal glucose: Secondary | ICD-10-CM

## 2018-06-03 DIAGNOSIS — E7800 Pure hypercholesterolemia, unspecified: Secondary | ICD-10-CM

## 2018-06-03 DIAGNOSIS — D509 Iron deficiency anemia, unspecified: Secondary | ICD-10-CM

## 2018-06-03 NOTE — Patient Instructions (Signed)
Continue your meds  Come by to get fasting labs done soon to recheck on all the blood work related to the diagnoses listed to the right.  Continue boost supplementation and continue any home exercise/strengthening done by physical therapy  Your last urine test and culture I can see if from March 2019 and was negative

## 2018-06-03 NOTE — Progress Notes (Signed)
Patient ID: Dawn Church, female    DOB: 28-Feb-1928, 84 y.o.   MRN: [id number]  PCP: Alycia Rossetti, MD  Chief Complaint  Patient presents with  . Follow-up    is not fasting    Subjective:   Dawn Church is a 84 y.o. female, presents to clinic with CC of follow-up for hypertension, back pain and osteoarthritis.  Blood pressure is managed on Norvasc ramipril and Maxide, well-controlled with this, is at goal today.  She denies any side effects.  She has not noted any high or low blood pressures.  Her daughter is here with her today states that she does check it frequently at home.  She denies any headaches, near-syncope, shortness of breath, orthopnea, PND, visual disturbances, chest pain, lower extremity edema.  She has some chronic unchanged swelling in her right ankle related to arthritis and an old injury. Also taking aspirin Lipitor, no muscle aches, abdominal pain, muscle spasms, jaundice. Is due for labs today.  In reviewing lab work there is some elevated blood sugars however I do not know if these are fasting or not, she is not fasting today, will add on A1c since I cannot find any in the chart to assess for any diabetes  Patient has osteoarthritis and gait instability, did complete work with home health and with physical therapy but she states she is completed that.  She is not using Voltaren gel because it was too expensive.  She continues to have chronic arthritis pain that is much worse with cold weather.   Patient Active Problem List   Diagnosis Date Noted  . DDD (degenerative disc disease), lumbar 12/30/2017  . Weight loss 04/30/2017  . Breast cancer of upper-outer quadrant of left female breast ([place]) 07/14/2016  . Constipation 02/12/2016  . Gait instability 05/08/2015  . History of stroke 07/05/2014  . Seborrheic keratoses, inflamed 03/08/2014  . Decreased hearing 03/08/2014  . Generalized OA 04/11/2013  . Pes anserinus bursitis of left knee 12/28/2012    . Lower back pain 05/11/2012  . Essential hypertension, benign 04/17/2012  . Hip pain, bilateral 04/17/2012  . Hyperlipidemia 04/17/2012  . Iron deficiency anemia 04/17/2012     Prior to Admission medications   Medication Sig Start Date End Date Taking? Authorizing Provider  Acetaminophen (APAP ARTHRITIS PO) Take 2 tablets by mouth as needed.   Yes [provider]  amLODipine (NORVASC) 5 MG tablet TAKE 1 TABLET (5 MG TOTAL) BY MOUTH EVERY MORNING. 10/02/17  Yes [place], Modena Nunnery, MD  aspirin EC 81 MG tablet Take 2 tablets (162 mg total) by mouth daily. 12/26/16  Yes [place], Modena Nunnery, MD  atorvastatin (LIPITOR) 10 MG tablet TAKE 1 TABLET (10 MG TOTAL) BY MOUTH AT BEDTIME. 10/02/17  Yes [place], Modena Nunnery, MD  diclofenac sodium (VOLTAREN) 1 % GEL Apply to affected areas tree times a day 01/05/18  Yes [place], Modena Nunnery, MD  fluticasone [facility]) 50 MCG/ACT nasal spray Place 2 sprays into both nostrils daily. 10/16/17  Yes [place], Modena Nunnery, MD  letrozole [facility]) 2.5 MG tablet Take 1 tablet (2.5 mg total) by mouth daily. 08/17/17  Yes Nicholas Lose, MD  Multiple Vitamin (MULTIVITAMIN) tablet Take 1 tablet by mouth daily.     Yes [provider]  nystatin (MYCOSTATIN/NYSTOP) powder Apply topically 4 (four) times daily. 04/30/17  Yes [place], Modena Nunnery, MD  omega-3 acid ethyl esters (LOVAZA) 1 g capsule TAKE 1 CAPSULE BY MOUTH 2 TIMES DAILY 03/11/18  Yes [place],  Modena Nunnery, MD  pantoprazole (PROTONIX) 40 MG tablet TAKE 1 TABLET (40 MG TOTAL) BY MOUTH DAILY BEFORE BREAKFAST. 10/02/17  Yes [place], Modena Nunnery, MD  ramipril (ALTACE) 10 MG capsule TAKE 1 CAPSULE (10 MG TOTAL) BY MOUTH 2 (TWO) TIMES DAILY. 05/19/18  Yes [place], Modena Nunnery, MD  triamterene-hydrochlorothiazide (MAXZIDE-25) 37.5-25 MG tablet Take 1 tablet by mouth daily. 10/02/17  Yes , Modena Nunnery, MD     Allergies  Allergen Reactions  . Codeine Other (See Comments)    Caused patient to "space out"  . Ultram [Tramadol]      nausea     Family History  Problem Relation Age of Onset  . Diabetes Unknown   . Cancer Unknown   . Cancer Daughter   . Colon cancer Neg Hx      Social History   Socioeconomic History  . Marital status: Widowed    Spouse name: Not on file  . Number of children: Not on file  . Years of education: Not on file  . Highest education level: Not on file  Occupational History  . Not on file  Social Needs  . Financial resource strain: Not on file  . Food insecurity:    Worry: Not on file    Inability: Not on file  . Transportation needs:    Medical: Not on file    Non-medical: Not on file  Tobacco Use  . Smoking status: Never Smoker  . Smokeless tobacco: Never Used  Substance and Sexual Activity  . Alcohol use: No  . Drug use: No  . Sexual activity: Not on file  Lifestyle  . Physical activity:    Days per week: Not on file    Minutes per session: Not on file  . Stress: Not on file  Relationships  . Social connections:    Talks on phone: Not on file    Gets together: Not on file    Attends religious service: Not on file    Active member of club or organization: Not on file    Attends meetings of clubs or organizations: Not on file    Relationship status: Not on file  . Intimate partner violence:    Fear of current or ex partner: Not on file    Emotionally abused: Not on file    Physically abused: Not on file    Forced sexual activity: Not on file  Other Topics Concern  . Not on file  Social History Narrative  . Not on file     Review of Systems  All other systems reviewed and are negative.      Objective:    Vitals:   06/03/18 1133  BP: 120/68  Pulse: 66  Resp: 16  Temp: 98.2 F (36.8 C)  TempSrc: Oral  SpO2: 97%  Weight: 163 lb (73.9 kg)  Height: 5\' 3"  (1.6 m)      Physical Exam  Constitutional: She appears well-developed.  HENT:  Head: Normocephalic and atraumatic.  Nose: Nose normal.  Eyes: Conjunctivae are normal. Right eye exhibits  no discharge. Left eye exhibits no discharge. No scleral icterus.  Neck: No tracheal deviation present.  Cardiovascular: Normal rate, regular rhythm, normal heart sounds and intact distal pulses. Exam reveals no gallop and no friction rub.  No murmur heard. Pulmonary/Chest: Effort normal. No stridor. No respiratory distress. She has no wheezes. She has no rales.  Musculoskeletal:       Right knee: She exhibits no swelling, no deformity and no erythema.  Neurological: She is alert. She exhibits normal muscle tone.  Skin: Skin is warm and dry. No rash noted.  Psychiatric: She has a normal mood and affect. Her behavior is normal.  Nursing note and vitals reviewed.         Assessment & Plan:      ICD-10-CM   1. Essential hypertension, benign W29 COMPLETE METABOLIC PANEL WITH GFR    CBC with Differential/Platelet    Lipid panel    Hemoglobin A1c  2. Generalized OA M15.9   3. Pure hypercholesterolemia E78.00 Lipid panel  4. Iron deficiency anemia, unspecified iron deficiency anemia type D50.9 CBC with Differential/Platelet  5. Other abnormal glucose R73.09 Hemoglobin A1c  6. Encounter for immunization Z23 Flu vaccine HIGH DOSE PF   Hypertension hyperlipidemia, will recheck CMP and fasting lipid panel, blood pressure well controlled we will continue current meds, tolerating statin   Generalized also arthritis, patient on Tylenol as directed, he had Voltaren gel but is unable to afford  Iron deficiency anemia, will will recheck CBC today, patient avoids NSAIDs  History of abnormal glucose although I do not know when these sugars were obtained whether they are random or fasting so will add A1c to other basic labs, no current symptoms to suggest uncontrolled diabetes       Delsa Grana, PA-C 06/03/18 11:41 AM

## 2018-08-14 NOTE — Progress Notes (Signed)
Patient Care Team: [facility], Modena Nunnery, MD as PCP - General (Family Medicine) Delice Bison, Charlestine Massed, NP as Nurse Practitioner (Hematology and Oncology) Excell Seltzer, MD as Consulting Physician (General Surgery) Nicholas Lose, MD as Consulting Physician (Hematology and Oncology) Kyung Rudd, MD as Consulting Physician (Radiation Oncology)  DIAGNOSIS:    ICD-10-CM   1. Malignant neoplasm of upper-outer quadrant of left breast in female, estrogen receptor positive ([place]) C50.412    Z17.0   2. Malignant neoplasm of upper-outer quadrant of left female breast, unspecified estrogen receptor status ([place]) C50.412 letrozole (FEMARA) 2.5 MG tablet    SUMMARY OF ONCOLOGIC HISTORY:   Breast cancer of upper-outer quadrant of left female breast ([place])   06/10/2016 Initial Diagnosis    Left breast biopsy 2:00 position: Grade 2 IDC with DCIS, ER 100%, PR 100%, Ki-67 5%, HER-2 negative ratio 1.05, 1.2 cm irregular mass left breast 2:00 position 8 cm from nipple, T1c N0 stage IA clinical stage    08/19/2016 Surgery    Left Lumpectomy: IDC with DCIS 1.3 cm, DCIS close to 0.1 cm to all margins;ER 100%, PR 100%, Ki-67 5%, HER-2 negative ratio 1.05, T1cNx (Stage 1A)    09/22/2016 - 10/20/2016 Radiation Therapy    Radiation Dawn Church): 1)Left breast/ 42.5 Gy in 17 fractions.  2) Left breast boost/ 10 Gy in 4 fractions    12/2016 -  Anti-estrogen oral therapy    Letrozole 2.5 mg daily     CHIEF COMPLIANT: Follow-up on letrozole therapy  INTERVAL HISTORY: Dawn Church is a 84 y.o. with above-mentioned history of left breast cancer treated with lumpectomy and radiation is currently on letrozole therapy. I last saw the patient one year ago. She presents to the clinic today alone and notes she has been doing well. She uses a cane to get around. She denies hot flashes, but notes aches and pains in her right leg related to a fall 3 years ago.   REVIEW OF SYSTEMS:   Constitutional: Denies fevers, chills or  abnormal weight loss Eyes: Denies blurriness of vision Ears, nose, mouth, throat, and face: Denies mucositis or sore throat Respiratory: Denies cough, dyspnea or wheezes Cardiovascular: Denies palpitation, chest discomfort Gastrointestinal:  Denies nausea, heartburn or change in bowel habits Skin: Denies abnormal skin rashes MSK: (+) aches and pains in right leg Lymphatics: Denies new lymphadenopathy or easy bruising Neurological:Denies numbness, tingling or new weaknesses Behavioral/Psych: Mood is stable, no new changes  Extremities: No lower extremity edema Breast: denies any pain or lumps or nodules in either breasts All other systems were reviewed with the patient and are negative.  I have reviewed the past medical history, past surgical history, social history and family history with the patient and they are unchanged from previous note.  ALLERGIES:  is allergic to codeine and ultram [tramadol].  MEDICATIONS:  Current Outpatient Medications  Medication Sig Dispense Refill  . Acetaminophen (APAP ARTHRITIS PO) Take 2 tablets by mouth as needed.    Marland Kitchen amLODipine (NORVASC) 5 MG tablet TAKE 1 TABLET (5 MG TOTAL) BY MOUTH EVERY MORNING. 90 tablet 2  . aspirin EC 81 MG tablet Take 2 tablets (162 mg total) by mouth daily.    Marland Kitchen atorvastatin (LIPITOR) 10 MG tablet TAKE 1 TABLET (10 MG TOTAL) BY MOUTH AT BEDTIME. 90 tablet 2  . diclofenac sodium (VOLTAREN) 1 % GEL Apply to affected areas tree times a day 1 Tube 2  . fluticasone (FLONASE) 50 MCG/ACT nasal spray Place 2 sprays into both nostrils daily.  48 g 3  . letrozole (FEMARA) 2.5 MG tablet Take 1 tablet (2.5 mg total) by mouth daily. 90 tablet 3  . Multiple Vitamin (MULTIVITAMIN) tablet Take 1 tablet by mouth daily.      Marland Kitchen nystatin (MYCOSTATIN/NYSTOP) powder Apply topically 4 (four) times daily. 60 g 2  . omega-3 acid ethyl esters (LOVAZA) 1 g capsule TAKE 1 CAPSULE BY MOUTH 2 TIMES DAILY 180 capsule 1  . pantoprazole (PROTONIX) 40 MG  tablet TAKE 1 TABLET (40 MG TOTAL) BY MOUTH DAILY BEFORE BREAKFAST. 90 tablet 2  . ramipril (ALTACE) 10 MG capsule TAKE 1 CAPSULE (10 MG TOTAL) BY MOUTH 2 (TWO) TIMES DAILY. 180 capsule 2  . triamterene-hydrochlorothiazide (MAXZIDE-25) 37.5-25 MG tablet Take 1 tablet by mouth daily. 90 tablet 2   No current facility-administered medications for this visit.     PHYSICAL EXAMINATION: ECOG PERFORMANCE STATUS: 1 - Symptomatic but completely ambulatory  Vitals:   08/17/18 1515  BP: (!) 182/72  Pulse: 80  Resp: 16  Temp: 98.5 F (36.9 C)  SpO2: 97%   Filed Weights   08/17/18 1515  Weight: 163 lb 4.8 oz (74.1 kg)    GENERAL:alert, no distress and comfortable SKIN: skin color, texture, turgor are normal, no rashes or significant lesions EYES: normal, Conjunctiva are pink and non-injected, sclera clear OROPHARYNX:no exudate, no erythema and lips, buccal mucosa, and tongue normal  NECK: supple, thyroid normal size, non-tender, without nodularity LYMPH:  no palpable lymphadenopathy in the cervical, axillary or inguinal LUNGS: clear to auscultation and percussion with normal breathing effort HEART: regular rate & rhythm and no murmurs and no lower extremity edema ABDOMEN:abdomen soft, non-tender and normal bowel sounds MUSCULOSKELETAL:no cyanosis of digits and no clubbing  NEURO: alert & oriented x 3 with fluent speech, no focal motor/sensory deficits EXTREMITIES: No lower extremity edema BREAST: No palpable masses or nodules in either right or left breasts.  Scar tissue was palpable in the left breast.  No palpable axillary supraclavicular or infraclavicular adenopathy no breast tenderness or nipple discharge. (exam performed in the presence of a chaperone)  LABORATORY DATA:  I have reviewed the data as listed CMP Latest Ref Rng & Units 02/01/2018 10/02/2017 04/30/2017  Glucose 65 - 99 mg/dL 133(H) 110(H) 158(H)  BUN 7 - 25 mg/dL _0 Creatinine 0.60 - 0.88 mg/dL 0.84 0.87 0.95(H)    Sodium 135 - 146 mmol/L 142 142 144  Potassium 3.5 - 5.3 mmol/L 3.6 4.0 3.8  Chloride 98 - 110 mmol/L 104 105 109  CO2 20 - 32 mmol/L _1 Calcium 8.6 - 10.4 mg/dL 10.6(H) 10.9(H) 10.6(H)  Total Protein 6.1 - 8.1 g/dL - 6.6 6.4  Total Bilirubin 0.2 - 1.2 mg/dL - 0.5 0.4  Alkaline Phos 33 - 130 U/L - - 67  AST 10 - 35 U/L - 18 20  ALT 6 - 29 U/L - 10 11    Lab Results  Component Value Date   WBC 7.8 02/01/2018   HGB 11.9 02/01/2018   HCT 34.7 (L) 02/01/2018   MCV 98.6 02/01/2018   PLT 232 02/01/2018   NEUTROABS 5,522 02/01/2018    ASSESSMENT & PLAN:  Breast cancer of upper-outer quadrant of left female breast ([place]) Left Lumpectomy 08/19/16: IDC with DCIS 1.3 cm, DCIS close to 0.1 cm to all margins;ER 100%, PR 100%, Ki-67 5%, HER-2 negative ratio 1.05, T1cNx (Stage 1A) Adj XRT completed 10/20/16  Current treatment: Letrozole 2.5 mg daily started05/11/2016 Bone density 12/19/2016: Normal  T score 0.4  Letrozole toxicities: Denies any hot flashes or myalgias. Slight discomfort in the right hip intermittently.  Breast cancer surveillance: Breast exam 08/17/2018: Benign Bone density 12/18/2016 T score 0.4: Normal Mammograms done at [facility]  Hypertension: She will follow with her primary care physician.  Return to clinic in 1 year for follow-up    No orders of the defined types were placed in this encounter.  The patient has a good understanding of the overall plan. she agrees with it. she will call with any problems that may develop before the next visit here.  Nicholas Lose, MD 08/17/2018   I, Cloyde Reams Dorshimer, am acting as scribe for Nicholas Lose, MD.  I have reviewed the above documentation for accuracy and completeness, and I agree with the above.

## 2018-08-17 ENCOUNTER — Inpatient Hospital Stay: Payer: Medicare Other | Attending: Hematology and Oncology | Admitting: Hematology and Oncology

## 2018-08-17 ENCOUNTER — Telehealth: Payer: Self-pay | Admitting: Hematology and Oncology

## 2018-08-17 DIAGNOSIS — Z923 Personal history of irradiation: Secondary | ICD-10-CM | POA: Diagnosis not present

## 2018-08-17 DIAGNOSIS — Z7982 Long term (current) use of aspirin: Secondary | ICD-10-CM | POA: Diagnosis not present

## 2018-08-17 DIAGNOSIS — Z170 Estrogen receptor positive status [ER+]: Secondary | ICD-10-CM | POA: Insufficient documentation

## 2018-08-17 DIAGNOSIS — Z79899 Other long term (current) drug therapy: Secondary | ICD-10-CM | POA: Insufficient documentation

## 2018-08-17 DIAGNOSIS — Z79811 Long term (current) use of aromatase inhibitors: Secondary | ICD-10-CM | POA: Insufficient documentation

## 2018-08-17 DIAGNOSIS — I10 Essential (primary) hypertension: Secondary | ICD-10-CM | POA: Diagnosis not present

## 2018-08-17 DIAGNOSIS — C50412 Malignant neoplasm of upper-outer quadrant of left female breast: Secondary | ICD-10-CM | POA: Insufficient documentation

## 2018-08-17 MED ORDER — LETROZOLE 2.5 MG PO TABS: 2.5000 mg | ORAL_TABLET | Freq: Every day | ORAL | 3 refills | Status: DC

## 2018-08-17 NOTE — Assessment & Plan Note (Signed)
Left Lumpectomy 08/19/16: IDC with DCIS 1.3 cm, DCIS close to 0.1 cm to all margins;ER 100%, PR 100%, Ki-67 5%, HER-2 negative ratio 1.05, T1cNx (Stage 1A) Adj XRT completed 10/20/16  Current treatment: Letrozole 2.5 mg daily started05/11/2016 Bone density 12/19/2016: Normal T score 0.4  Letrozole toxicities: Denies any hot flashes or myalgias. Slight discomfort in the right hip intermittently.  Breast cancer surveillance: Breast exam 08/17/2018: Benign Bone density 12/18/2016 T score 0.4: Normal Mammograms done at [facility]  Hypertension: She will follow with her primary care physician.  Return to clinic in 1 year for follow-up

## 2018-08-17 NOTE — Telephone Encounter (Signed)
Gave avs and calendar ° °

## 2018-08-18 ENCOUNTER — Other Ambulatory Visit: Payer: Medicare Other

## 2018-08-18 ENCOUNTER — Encounter: Payer: Self-pay | Admitting: Family Medicine

## 2018-08-18 ENCOUNTER — Ambulatory Visit: Payer: Medicare Other | Admitting: Family Medicine

## 2018-08-18 ENCOUNTER — Other Ambulatory Visit: Payer: Self-pay

## 2018-08-18 VITALS — BP 120/66 | HR 72 | Temp 98.3°F | Resp 16 | Ht 63.0 in | Wt 163.0 lb

## 2018-08-18 DIAGNOSIS — Z8673 Personal history of transient ischemic attack (TIA), and cerebral infarction without residual deficits: Secondary | ICD-10-CM

## 2018-08-18 DIAGNOSIS — D509 Iron deficiency anemia, unspecified: Secondary | ICD-10-CM

## 2018-08-18 DIAGNOSIS — M159 Polyosteoarthritis, unspecified: Secondary | ICD-10-CM

## 2018-08-18 DIAGNOSIS — K5901 Slow transit constipation: Secondary | ICD-10-CM

## 2018-08-18 DIAGNOSIS — R7309 Other abnormal glucose: Secondary | ICD-10-CM

## 2018-08-18 DIAGNOSIS — I10 Essential (primary) hypertension: Secondary | ICD-10-CM

## 2018-08-18 DIAGNOSIS — N3946 Mixed incontinence: Secondary | ICD-10-CM

## 2018-08-18 DIAGNOSIS — E7800 Pure hypercholesterolemia, unspecified: Secondary | ICD-10-CM | POA: Diagnosis not present

## 2018-08-18 NOTE — Assessment & Plan Note (Signed)
Tylenol as needed, declines orthopedic intervention

## 2018-08-18 NOTE — Patient Instructions (Addendum)
F/U 4 months for physical  We will call with results Take the miralax for your bowels

## 2018-08-18 NOTE — Assessment & Plan Note (Signed)
On lipitor without difficulty, recheck lipids, LFT

## 2018-08-18 NOTE — Assessment & Plan Note (Signed)
She is willing to try the miralax again, has good supply at home

## 2018-08-18 NOTE — Progress Notes (Signed)
   Subjective:    Patient ID: Dawn Church, female    DOB: Aug 01, 1928, 84 y.o.   MRN: [id number]  Patient presents for Follow-up (is fasting) Patient here to follow-up chronic medical problems.  Medications reviewed Is followed by oncology secondary history of breast cancer had a recent visit there were no changes.  HTN- taking BP meds as prescribed, no side effects with her medications   Due for fasting labs today   Mild hypercalcemia last level  10.6, she is not on any calcium supplements currently    [place] frequency and incontinence, worse at night, often leaks, wearing depends  Constipation-she is stopped and started multiple medications in the past.  She does not want to take the [place].  She has MiraLAX at home but has not taken this recently.  She also has been taking benefiber but this is not helped.  Review Of Systems:  GEN- denies fatigue, fever, weight loss,weakness, recent illness HEENT- denies eye drainage, change in vision, nasal discharge, CVS- denies chest pain, palpitations RESP- denies SOB, cough, wheeze ABD- denies N/V, change in stools, abd pain GU- denies dysuria, hematuria, dribbling, incontinence MSK- denies joint pain, muscle aches, injury Neuro- denies headache, dizziness, syncope, seizure activity       Objective:    BP 120/66   Pulse 72   Temp 98.3 F (36.8 C) (Oral)   Resp 16   Ht 5\' 3"  (1.6 m)   Wt 163 lb (73.9 kg)   LMP  (LMP Unknown)   SpO2 99%   BMI 28.87 kg/m  GEN- NAD, alert and oriented x3,walks with walker  HEENT- PERRL, EOMI, non injected sclera, pink conjunctiva, MMM, oropharynx clear Neck- Supple, no thyromegaly CVS- RRR, no murmur RESP-CTAB ABD-NABS,soft,NT,ND EXT- No edema Pulses- Radial, DP- 2+        Assessment & Plan:      Problem List Items Addressed This Visit      Unprioritized   Constipation    She is willing to try the miralax again, has good supply at home       Essential hypertension, benign -  Primary    Controlled no changes       Generalized OA    Tylenol as needed, declines orthopedic intervention      History of stroke   Hyperlipidemia    On lipitor without difficulty, recheck lipids, LFT      Iron deficiency anemia    Other Visit Diagnoses    Mixed stress and urge urinary incontinence       decided against medications, but will check ensure no infection, continue depends   Relevant Orders   Urinalysis, Routine w reflex microscopic   Urine Culture   Other abnormal glucose          Note: This dictation was prepared with Dragon dictation along with smaller phrase technology. Any transcriptional errors that result from this process are unintentional.

## 2018-08-18 NOTE — Assessment & Plan Note (Signed)
Controlled no changes 

## 2018-08-19 LAB — URINALYSIS, ROUTINE W REFLEX MICROSCOPIC
BACTERIA UA: NONE SEEN /HPF
Bilirubin Urine: NEGATIVE
Glucose, UA: NEGATIVE
HGB URINE DIPSTICK: NEGATIVE
HYALINE CAST: NONE SEEN /LPF
KETONES UR: NEGATIVE
Nitrite: NEGATIVE
PH: 7 (ref 5.0–8.0)
PROTEIN: NEGATIVE
RBC / HPF: NONE SEEN /HPF (ref 0–2)
Specific Gravity, Urine: 1.017 (ref 1.001–1.03)

## 2018-08-19 LAB — URINE CULTURE
MICRO NUMBER:: 91514844
Result:: NO GROWTH
SPECIMEN QUALITY: ADEQUATE

## 2018-08-24 LAB — CBC WITH DIFFERENTIAL/PLATELET
Absolute Monocytes: 360 cells/uL (ref 200–950)
BASOS ABS: 18 {cells}/uL (ref 0–200)
Basophils Relative: 0.3 %
EOS ABS: 31 {cells}/uL (ref 15–500)
EOS PCT: 0.5 %
HCT: 35.9 % (ref 35.0–45.0)
Hemoglobin: 12.2 g/dL (ref 11.7–15.5)
Lymphs Abs: 1214 cells/uL (ref 850–3900)
MCH: 33.8 pg — AB (ref 27.0–33.0)
MCHC: 34 g/dL (ref 32.0–36.0)
MCV: 99.4 fL (ref 80.0–100.0)
MONOS PCT: 5.9 %
MPV: 9.5 fL (ref 7.5–12.5)
Neutro Abs: 4477 cells/uL (ref 1500–7800)
Neutrophils Relative %: 73.4 %
PLATELETS: 223 10*3/uL (ref 140–400)
RBC: 3.61 10*6/uL — ABNORMAL LOW (ref 3.80–5.10)
RDW: 12.3 % (ref 11.0–15.0)
TOTAL LYMPHOCYTE: 19.9 %
WBC: 6.1 10*3/uL (ref 3.8–10.8)

## 2018-08-24 LAB — COMPLETE METABOLIC PANEL WITH GFR
AG RATIO: 1.6 (calc) (ref 1.0–2.5)
ALT: 9 U/L (ref 6–29)
AST: 18 U/L (ref 10–35)
Albumin: 4.7 g/dL (ref 3.6–5.1)
Alkaline phosphatase (APISO): 82 U/L (ref 33–130)
BUN/Creatinine Ratio: 18 (calc) (ref 6–22)
BUN: 18 mg/dL (ref 7–25)
CO2: 26 mmol/L (ref 20–32)
Calcium: 11.4 mg/dL — ABNORMAL HIGH (ref 8.6–10.4)
Chloride: 104 mmol/L (ref 98–110)
Creat: 1.01 mg/dL — ABNORMAL HIGH (ref 0.60–0.88)
GFR, Est African American: 58 mL/min/{1.73_m2} — ABNORMAL LOW (ref >=60)
GFR, Est Non African American: 50 mL/min/{1.73_m2} — ABNORMAL LOW (ref >=60)
GLOBULIN: 2.9 g/dL (ref 1.9–3.7)
Glucose, Bld: 115 mg/dL — ABNORMAL HIGH (ref 65–99)
POTASSIUM: 3.5 mmol/L (ref 3.5–5.3)
SODIUM: 142 mmol/L (ref 135–146)
Total Bilirubin: 0.6 mg/dL (ref 0.2–1.2)
Total Protein: 7.6 g/dL (ref 6.1–8.1)

## 2018-08-24 LAB — TEST AUTHORIZATION

## 2018-08-24 LAB — LIPID PANEL
CHOL/HDL RATIO: 2.7 (calc) (ref <5.0)
CHOLESTEROL: 193 mg/dL (ref <200)
HDL: 71 mg/dL (ref >50)
LDL Cholesterol (Calc): 97 mg/dL (calc)
NON-HDL CHOLESTEROL (CALC): 122 mg/dL (ref <130)
Triglycerides: 149 mg/dL (ref <150)

## 2018-08-24 LAB — HEMOGLOBIN A1C
HEMOGLOBIN A1C: 6 %{Hb} — AB (ref <5.7)
MEAN PLASMA GLUCOSE: 126 (calc)
eAG (mmol/L): 7 (calc)

## 2018-08-24 LAB — VITAMIN D 25 HYDROXY (VIT D DEFICIENCY, FRACTURES): Vit D, 25-Hydroxy: 28 ng/mL — ABNORMAL LOW (ref 30–100)

## 2018-08-27 ENCOUNTER — Other Ambulatory Visit: Payer: Medicare Other

## 2018-08-30 LAB — PARATHYROID HORMONE, INTACT (NO CA): PTH: 113 pg/mL — ABNORMAL HIGH (ref 14–64)

## 2018-09-07 ENCOUNTER — Other Ambulatory Visit: Payer: Self-pay | Admitting: Family Medicine

## 2018-09-19 ENCOUNTER — Other Ambulatory Visit: Payer: Self-pay | Admitting: Family Medicine

## 2018-09-20 ENCOUNTER — Other Ambulatory Visit: Payer: Self-pay | Admitting: Family Medicine

## 2018-10-05 ENCOUNTER — Ambulatory Visit: Payer: Medicare Other | Admitting: Family Medicine

## 2018-10-05 ENCOUNTER — Other Ambulatory Visit: Payer: Self-pay | Admitting: Family Medicine

## 2018-10-12 ENCOUNTER — Other Ambulatory Visit: Payer: Self-pay | Admitting: Family Medicine

## 2018-11-02 ENCOUNTER — Other Ambulatory Visit: Payer: Self-pay | Admitting: Family Medicine

## 2018-12-03 ENCOUNTER — Other Ambulatory Visit: Payer: Self-pay | Admitting: Hematology and Oncology

## 2018-12-03 DIAGNOSIS — C50412 Malignant neoplasm of upper-outer quadrant of left female breast: Secondary | ICD-10-CM

## 2018-12-17 ENCOUNTER — Encounter: Payer: Medicare Other | Admitting: Family Medicine

## 2019-02-04 ENCOUNTER — Ambulatory Visit: Payer: Medicare Other | Admitting: Family Medicine

## 2019-02-06 ENCOUNTER — Other Ambulatory Visit: Payer: Self-pay | Admitting: Family Medicine

## 2019-02-08 ENCOUNTER — Other Ambulatory Visit: Payer: Self-pay

## 2019-02-08 ENCOUNTER — Ambulatory Visit: Payer: Medicare Other | Admitting: Family Medicine

## 2019-02-08 ENCOUNTER — Encounter: Payer: Self-pay | Admitting: Family Medicine

## 2019-02-08 VITALS — BP 122/84 | HR 80 | Temp 98.7°F | Resp 15 | Ht 63.0 in | Wt 159.0 lb

## 2019-02-08 DIAGNOSIS — K5901 Slow transit constipation: Secondary | ICD-10-CM

## 2019-02-08 DIAGNOSIS — R32 Unspecified urinary incontinence: Secondary | ICD-10-CM

## 2019-02-08 DIAGNOSIS — E349 Endocrine disorder, unspecified: Secondary | ICD-10-CM

## 2019-02-08 DIAGNOSIS — I10 Essential (primary) hypertension: Secondary | ICD-10-CM

## 2019-02-08 LAB — URINALYSIS, ROUTINE W REFLEX MICROSCOPIC
Bilirubin Urine: NEGATIVE
Glucose, UA: NEGATIVE
Hgb urine dipstick: NEGATIVE
Ketones, ur: NEGATIVE
Leukocytes,Ua: NEGATIVE
Nitrite: NEGATIVE
Protein, ur: NEGATIVE
Specific Gravity, Urine: 1.025 (ref 1.001–1.03)
pH: 5.5 (ref 5.0–8.0)

## 2019-02-08 NOTE — Assessment & Plan Note (Addendum)
Discussed options she prefers to stick with her over-the-counter laxative.  Had discussions about the urinary incontinence as well.  I do not recommend putting her on another medication at this time it is actually quite mild she uses a pad for occasional leaking.  She does not have any signs of urinary tract infection noted today.   I will recheck her PTH levels and her serum calcium level.  Of note also think that she has been taking her diuretic at nighttime she can take this in the morning.

## 2019-02-08 NOTE — Patient Instructions (Addendum)
Take the maxzide blood pressure pill in the morning  Okay to take the vegetable laxative We will call with calcium levels Use depends or pads for incontinence, urine sample is normal  F/U 4 months

## 2019-02-08 NOTE — Progress Notes (Signed)
   Subjective:    Patient ID: Dawn Church, female    DOB: October 04, 1927, 85 y.o.   MRN: [id number]  Patient presents for Urinary Incontinence (Patient has concerns of urinary incontience and constipation)     Pt here bladder and bowel problems   Constipation- taking vegetable laxative to help her constipation, she has tried miralax and linzess. Miralax didn't help, linzess she didn't like  often goes up to 3 days without BM, had one night.   No abdominal cramps   No stool leaking   she has tried MOM as well    Urinary leakage with incontinence- wearing pad daily, has not worsened She took myrbetriq    Due for PTH repeat  Previously elevated   113   Decreased calcium to 600mg      HTN- taking     We will call [place]:  GEN- denies fatigue, fever, weight loss,weakness, recent illness HEENT- denies eye drainage, change in vision, nasal discharge, CVS- denies chest pain, palpitations RESP- denies SOB, cough, wheeze ABD- denies N/V, change in stools, abd pain GU- denies dysuria, hematuria, dribbling, incontinence MSK- denies joint pain, muscle aches, injury Neuro- denies headache, dizziness, syncope, seizure activity       Objective:    BP 122/84   Pulse 80   Temp 98.7 F (37.1 C) (Oral)   Resp 15   Ht 5\' 3"  (1.6 m)   Wt 159 lb (72.1 kg)   LMP  (LMP Unknown)   SpO2 98%   BMI 28.17 kg/m  GEN- NAD, alert and oriented x3 HEENT- PERRL, EOMI, non injected sclera, pink conjunctiva, MMM, oropharynx clear Neck- Supple, no thyromegaly CVS- RRR, no murmur RESP-CTAB ABD-NABS,soft,NT,ND EXT- No edema Pulses- Radial, DP- 2+        Assessment & Plan:      Problem List Items Addressed This Visit      Unprioritized   Constipation    Discussed options she prefers to stick with her over-the-counter laxative.  Had discussions about the urinary incontinence as well.  I do not recommend putting her on another medication at this time it is actually  quite mild she uses a pad for occasional leaking.  She does not have any signs of urinary tract infection noted today.   I will recheck her PTH levels and her serum calcium level.  Of note also think that she has been taking her diuretic at nighttime she can take this in the morning.      Essential hypertension, benign    Controlled no changes        Other Visit Diagnoses    Urinary incontinence, unspecified type    -  Primary   Relevant Orders   Urinalysis, Routine w reflex microscopic (Completed)   Increased PTH level       Relevant Orders   Comprehensive metabolic panel   PTH, Intact and Calcium   Serum calcium elevated       Relevant Orders   Comprehensive metabolic panel   PTH, Intact and Calcium      Note: This dictation was prepared with Dragon dictation along with smaller phrase technology. Any transcriptional errors that result from this process are unintentional.

## 2019-02-08 NOTE — Assessment & Plan Note (Signed)
Controlled no changes 

## 2019-02-09 LAB — COMPREHENSIVE METABOLIC PANEL
AG Ratio: 1.8 (calc) (ref 1.0–2.5)
ALT: 7 U/L (ref 6–29)
AST: 15 U/L (ref 10–35)
Albumin: 4.1 g/dL (ref 3.6–5.1)
Alkaline phosphatase (APISO): 64 U/L (ref 37–153)
BUN/Creatinine Ratio: 30 (calc) — ABNORMAL HIGH (ref 6–22)
BUN: 26 mg/dL — ABNORMAL HIGH (ref 7–25)
CO2: 26 mmol/L (ref 20–32)
Calcium: 11 mg/dL — ABNORMAL HIGH (ref 8.6–10.4)
Chloride: 108 mmol/L (ref 98–110)
Creat: 0.88 mg/dL (ref 0.60–0.88)
Globulin: 2.3 g/dL (calc) (ref 1.9–3.7)
Glucose, Bld: 95 mg/dL (ref 65–99)
Potassium: 4.1 mmol/L (ref 3.5–5.3)
Sodium: 144 mmol/L (ref 135–146)
Total Bilirubin: 0.5 mg/dL (ref 0.2–1.2)
Total Protein: 6.4 g/dL (ref 6.1–8.1)

## 2019-02-09 LAB — PTH, INTACT AND CALCIUM
Calcium: 11.4 mg/dL — ABNORMAL HIGH (ref 8.6–10.4)
PTH: 100 pg/mL — ABNORMAL HIGH (ref 14–64)

## 2019-02-15 ENCOUNTER — Telehealth: Payer: Self-pay | Admitting: *Deleted

## 2019-02-15 NOTE — Telephone Encounter (Signed)
Typically this does not cause a problem But before we send her to endocrinology She can do a trial off lipitor for 1 month, and recheck calcium level first-lab visit only    Hyperacalcemia- hyperparathyrodism ORder PTH/intact calcium

## 2019-02-15 NOTE — Telephone Encounter (Signed)
Call placed to patient. LMTRC.  

## 2019-02-15 NOTE — Telephone Encounter (Signed)
Received call from patient daughter, [name]. [phone]~ telephone.   Reports that she has removed all supplements from the patient's home with calcium due to elevated levels.   Inquired as to if she should stop Atorvastatin Calcium.   MD please advise.

## 2019-02-16 ENCOUNTER — Other Ambulatory Visit: Payer: Self-pay | Admitting: *Deleted

## 2019-02-16 DIAGNOSIS — E210 Primary hyperparathyroidism: Secondary | ICD-10-CM

## 2019-02-16 NOTE — Telephone Encounter (Signed)
Leverne, patient daughter returned call and made aware.   States that she will stop Atorvastatin and bring in for labs in (1) month.

## 2019-03-03 ENCOUNTER — Other Ambulatory Visit: Payer: Self-pay | Admitting: Family Medicine

## 2019-03-16 ENCOUNTER — Other Ambulatory Visit: Payer: Medicare Other

## 2019-03-16 ENCOUNTER — Other Ambulatory Visit: Payer: Self-pay

## 2019-03-16 DIAGNOSIS — E210 Primary hyperparathyroidism: Secondary | ICD-10-CM

## 2019-03-22 LAB — PTH, INTACT AND CALCIUM
Calcium: 10.9 mg/dL — ABNORMAL HIGH (ref 8.6–10.4)
PTH: 124 pg/mL — ABNORMAL HIGH (ref 14–64)

## 2019-03-24 ENCOUNTER — Other Ambulatory Visit: Payer: Self-pay | Admitting: *Deleted

## 2019-03-24 DIAGNOSIS — E349 Endocrine disorder, unspecified: Secondary | ICD-10-CM

## 2019-03-24 DIAGNOSIS — E210 Primary hyperparathyroidism: Secondary | ICD-10-CM

## 2019-04-28 ENCOUNTER — Other Ambulatory Visit: Payer: Self-pay

## 2019-04-28 ENCOUNTER — Encounter: Payer: Self-pay | Admitting: "Endocrinology

## 2019-04-28 ENCOUNTER — Ambulatory Visit: Payer: Medicare Other | Admitting: "Endocrinology

## 2019-04-28 DIAGNOSIS — E210 Primary hyperparathyroidism: Secondary | ICD-10-CM | POA: Diagnosis not present

## 2019-04-28 NOTE — Progress Notes (Signed)
Endocrinology consult Note       04/28/2019, 5:16 PM  Dawn Church is a 85 y.o.-year-old female, referred by her  Dawn Rossetti, MD  , for evaluation for hypercalcemia/hyperparathyroidism.   Past Medical History:  Diagnosis Date  . Anemia   . Ankle swelling   . Cancer ([place]) 06/22/2016   left breast DCIS  . CVA (cerebral infarction)   . Epigastric pain   . GERD (gastroesophageal reflux disease)   . Hemochromatosis   . Hx of seasonal allergies   . Hypercholesteremia   . Hypertension   . Prolonged QT interval   . Stomach ulcer   . Stroke ([place]) 2015  . TIA (transient ischemic attack)     Past Surgical History:  Procedure Laterality Date  . APPENDECTOMY    . BREAST BIOPSY     Bilateral  . BREAST BIOPSY     bilateral  . BREAST LUMPECTOMY WITH RADIOACTIVE SEED LOCALIZATION Left 08/19/2016   Procedure: LEFT BREAST LUMPECTOMY WITH RADIOACTIVE SEED LOCALIZATION;  Surgeon: Excell Seltzer, MD;  Location: [facility];  Service: General;  Laterality: Left;  . CATARACT EXTRACTION, BILATERAL    . ESOPHAGOGASTRODUODENOSCOPY  12/20/2010  . SHOULDER OPEN ROTATOR CUFF REPAIR     right   . VAGINAL HYSTERECTOMY      Social History   Tobacco Use  . Smoking status: Never Smoker  . Smokeless tobacco: Never Used  Substance Use Topics  . Alcohol use: No  . Drug use: No    Family History  Problem Relation Age of Onset  . Diabetes Other   . Cancer Other   . Cancer Daughter   . Colon cancer Neg Hx     Outpatient Encounter Medications as of 04/28/2019  Medication Sig  . Acetaminophen (APAP ARTHRITIS PO) Take 2 tablets by mouth as needed.  Marland Kitchen amLODipine (NORVASC) 5 MG tablet TAKE 1 TABLET BY MOUTH EVERY DAY IN THE MORNING  . aspirin EC 81 MG tablet Take 2 tablets (162 mg total) by mouth daily.  Marland Kitchen atorvastatin (LIPITOR) 10 MG tablet TAKE 1 TABLET BY MOUTH EVERYDAY AT BEDTIME  . diclofenac sodium  (VOLTAREN) 1 % GEL Apply to affected areas tree times a day  . fluticasone (FLONASE) 50 MCG/ACT nasal spray SPRAY 2 SPRAYS INTO EACH NOSTRIL EVERY DAY  . letrozole (FEMARA) 2.5 MG tablet TAKE 1 TABLET BY MOUTH EVERY DAY  . Multiple Vitamin (MULTIVITAMIN) tablet Take 1 tablet by mouth daily.    Marland Kitchen nystatin (MYCOSTATIN/NYSTOP) powder APPLY TO AFFECTED AREA 4 TIMES A DAY  . omega-3 acid ethyl esters (LOVAZA) 1 g capsule TAKE 1 CAPSULE BY MOUTH TWICE A DAY  . pantoprazole (PROTONIX) 40 MG tablet TAKE 1 TABLET (40 MG TOTAL) BY MOUTH DAILY BEFORE BREAKFAST.  . ramipril (ALTACE) 10 MG capsule TAKE 1 CAPSULE BY MOUTH 2 TIMES DAILY.  Marland Kitchen triamterene-hydrochlorothiazide (MAXZIDE-25) 37.5-25 MG tablet TAKE 1 TABLET BY MOUTH EVERY DAY   No facility-administered encounter medications on file as of 04/28/2019.     Allergies  Allergen Reactions  . Codeine Other (See Comments)    Caused patient to "space out"  . Ultram [Tramadol]  nausea     HPI  Dawn Church was diagnosed with hypercalcemia in May 2018 when it was 10.7 mg per DL.  Patient has no previously known history of parathyroid, pituitary, adrenal dysfunctions; no family history of such dysfunctions. More recently she had a series of elevated calcium levels ranging from 10.6-11.4. -The most recent calcium level was 10.9 with a corresponding PTH of 124. I reviewed pt's DEXA scans: She has normal DEXA scan on December 18, 2016.  No prior history of fragility fractures or falls. No history of  kidney stones.  No history of CKD. Last BUN/Cr: 26/0.88  she is  on HCTZ combined with triamterene 37.5/25 mg for hypertension.    No history of  vitamin D deficiency. Last vitamin D level was 28 in December 2019.   she has taken calcium supplement in the past, was advised to discontinue in June 2020.  she eats dairy and green, leafy, vegetables on average amounts.  she does not have a family history of hypercalcemia, pituitary tumors, thyroid cancer,  or osteoporosis.  She denies loss of height.  She walks with a walker for stability. -She is hard of hearing, mild cognitive deficit.  She is accompanied by her granddaughter.  ROS:  Constitutional:  + recent  weight loss of 5lbs, no fatigue, no subjective hyperthermia, no subjective hypothermia Eyes: no blurry vision, no xerophthalmia ENT: no sore throat, no nodules palpated in throat, no dysphagia/odynophagia, no hoarseness Cardiovascular: no Chest Pain, no Shortness of Breath, no palpitations, no leg swelling Respiratory: no cough, no shortness of breath  Gastrointestinal: no Nausea/Vomiting/Diarhhea Musculoskeletal: no muscle/joint aches Skin: no rashes Neurological: no tremors, no numbness, no tingling, no dizziness Psychiatric: no depression, no anxiety  PE: BP 139/83   Pulse 76   Ht 5\' 3"  (1.6 m)   Wt 158 lb (71.7 kg)   LMP  (LMP Unknown)   BMI 27.99 kg/m , Body mass index is 27.99 kg/m. Wt Readings from Last 3 Encounters:  04/28/19 158 lb (71.7 kg)  02/08/19 159 lb (72.1 kg)  08/18/18 163 lb (73.9 kg)    Constitutional: + Appropriate weight for height, not in acute distress, normal state of mind Eyes: PERRLA, EOMI, no exophthalmos ENT: moist mucous membranes, no gross thyromegaly, no gross cervical lymphadenopathy Cardiovascular: normal precordial activity, Regular Rate and Rhythm, no Murmur/Rubs/Gallops Respiratory:  adequate breathing efforts, no gross chest deformity, Clear to auscultation bilaterally Gastrointestinal: abdomen soft, Non -tender, No distension, Bowel Sounds present Musculoskeletal: no gross deformities, strength intact in all four extremities Skin: moist, warm, no rashes Neurological: no tremor with outstretched hands, Deep tendon reflexes normal in bilateral lower extremities.     CMP ( most recent) CMP     Component Value Date/Time   NA 144 02/08/2019 1253   NA 144 01/02/2017 1057   K 4.1 02/08/2019 1253   K 3.9 01/02/2017 1057   CL 108  02/08/2019 1253   CO2 26 02/08/2019 1253   CO2 27 01/02/2017 1057   GLUCOSE 95 02/08/2019 1253   GLUCOSE 119 01/02/2017 1057   BUN 26 (H) 02/08/2019 1253   BUN 15.7 01/02/2017 1057   CREATININE 0.88 02/08/2019 1253   CREATININE 0.9 01/02/2017 1057   CALCIUM 10.9 (H) 03/16/2019 1124   CALCIUM 10.7 (H) 01/02/2017 1057   PROT 6.4 02/08/2019 1253   PROT 7.0 01/02/2017 1057   ALBUMIN 4.0 04/30/2017 1147   ALBUMIN 3.7 01/02/2017 1057   AST 15 02/08/2019 1253   AST 22 01/02/2017 1057  ALT 7 02/08/2019 1253   ALT 15 01/02/2017 1057   ALKPHOS 67 04/30/2017 1147   ALKPHOS 81 01/02/2017 1057   BILITOT 0.5 02/08/2019 1253   BILITOT 0.43 01/02/2017 1057   GFRNONAA 50 (L) 08/18/2018 1049   GFRAA 58 (L) 08/18/2018 1049    Lab Results  Component Value Date   HGBA1C 6.0 (H) 08/18/2018   HGBA1C  03/14/2009    5.6 (NOTE) The ADA recommends the following therapeutic goal for glycemic control related to Hgb A1c measurement: Goal of therapy: <6.5 Hgb A1c  Reference: American Diabetes Association: Clinical Practice Recommendations 2010, Diabetes Care, 2010, 33: (Suppl  1).   Lipid Panel     Component Value Date/Time   CHOL 193 08/18/2018 1049   TRIG 149 08/18/2018 1049   HDL 71 08/18/2018 1049   CHOLHDL 2.7 08/18/2018 1049   VLDL 31 (H) 04/23/2016 1156   LDLCALC 97 08/18/2018 1049      Lab Results  Component Value Date   TSH 1.362 04/08/2013   TSH 2.328 01/06/2013   TSH 1.554 09/24/2012    Results for ALANTE, REATEGUI (MRN [id number]) as of 04/28/2019 17:19  Ref. Range 08/18/2018 10:49 08/27/2018 10:31 02/08/2019 12:53 02/08/2019 12:54 03/16/2019 11:24  eAG (mmol/L) Latest Units: (calc) 7.0      Glucose Latest Ref Range: 65 - 99 mg/dL 115 (H)  95    Hemoglobin A1C Latest Ref Range: <5.7 % of total Hgb 6.0 (H)      PTH, Intact Latest Ref Range: 14 - 64 pg/mL  113 (H)  100 (H) 124 (H)    Assessment: 1. Hypercalcemia / Hyperparathyroidism  Plan: Patient has had several instances of  elevated calcium, with the highest level being at 11.4 mg/dL. A corresponding intact PTH level was also high, at 124.  - Patient   has  Mild vitamin D deficiency,  with the last level being 28, currently on multivitamins. -Low she took vitamin D/calcium supplement in the past, she was advised by her PMD to discontinue all calcium supplements since June 2020.   - no history of  nephrolithiasis,  osteoporosis,fragility fractures. No abdominal pain, no major mood disorders, no bone pain.  - I discussed with the patient about the physiology of calcium and parathyroid hormone, and possible  effects of  increased PTH/ Calcium , including kidney stones, cardiac dysrhythmias, osteoporosis, abdominal pain, etc.   -It is likely that she has primary hyperparathyroidism causing mild to moderate hypercalcemia, the work up so far is not sufficient to reach a conclusion for definitive therapy.  she  needs more studies to confirm and classify the parathyroid dysfunction she may have. I will proceed to obtain  repeat intact PTH/calcium, serum magnesium, serum phosphorus.  In the elderly age group, it is important to rule out malignancy related hypercalcemia.  I will include PTH related peptide, and angiotensin converting enzyme levels to complete the work-up.    It is also essential to obtain 24-hour urine calcium/creatinine to rule out the rare but important cause of mild elevation in calcium and PTH- [place] ( Familial Hypocalciuric Hypercalcemia), which may not require any active intervention.  -She is not a surgical candidate, even if she is confirmed to have primary hyperparathyroidism.  She will be considered for cinacalcet therapy if she continues to have significant hypercalcemia. -Her DEXA scan in 2018 was normal, will be considered for repeat DEXA scan including the distal 33% of radius.  -She will continue to benefit from hydrochlorothiazide therapy, did not change  her blood pressure medication.  - Time  spent with the patient: 45 minutes, of which >50% was spent in obtaining information about her symptoms, reviewing her previous labs, evaluations, and treatments, counseling her about her hypercalcemia/hyperparathyroidism, and developing a plan to confirm the diagnosis and long term treatment as necessary.  Please refer to " Patient Self Inventory" in the Media  tab for reviewed elements of pertinent patient history.  Dawn Church participated in the discussions, expressed understanding, and voiced agreement with the above plans.  All questions were answered to her satisfaction. she is encouraged to contact clinic should she have any questions or concerns prior to her return visit.  - Return in about 2 weeks (around 05/12/2019) for Labs Today- Non-Fasting Ok, 24 Hours Urine Calcium and Creatinine.   Glade Lloyd, MD [facility] [address] [place], [place] [address] Phone: [phone]  Fax: [phone]    This note was partially dictated with voice recognition software. Similar sounding words can be transcribed inadequately or may not  be corrected upon review.  04/28/2019, 5:16 PM

## 2019-05-03 LAB — ANGIOTENSIN CONVERTING ENZYME: Angiotensin-Converting Enzyme: <5 U/L — ABNORMAL LOW (ref 9–67)

## 2019-05-03 LAB — PTH, INTACT AND CALCIUM
Calcium: 10.8 mg/dL — ABNORMAL HIGH (ref 8.6–10.4)
PTH: 129 pg/mL — ABNORMAL HIGH (ref 14–64)

## 2019-05-03 LAB — PHOSPHORUS: Phosphorus: 2.2 mg/dL (ref 2.1–4.3)

## 2019-05-03 LAB — PTH-RELATED PEPTIDE: PTH-Related Protein (PTH-RP): 9 pg/mL — ABNORMAL LOW (ref 14–27)

## 2019-05-03 LAB — MAGNESIUM: Magnesium: 1.7 mg/dL (ref 1.5–2.5)

## 2019-05-17 ENCOUNTER — Other Ambulatory Visit: Payer: Self-pay

## 2019-05-17 DIAGNOSIS — E210 Primary hyperparathyroidism: Secondary | ICD-10-CM

## 2019-05-18 ENCOUNTER — Encounter: Payer: Self-pay | Admitting: "Endocrinology

## 2019-05-18 ENCOUNTER — Ambulatory Visit: Payer: Medicare Other | Admitting: "Endocrinology

## 2019-05-18 ENCOUNTER — Other Ambulatory Visit: Payer: Self-pay

## 2019-05-18 ENCOUNTER — Ambulatory Visit (INDEPENDENT_AMBULATORY_CARE_PROVIDER_SITE_OTHER): Payer: Medicare Other | Admitting: "Endocrinology

## 2019-05-18 DIAGNOSIS — E210 Primary hyperparathyroidism: Secondary | ICD-10-CM

## 2019-05-18 NOTE — Progress Notes (Signed)
Endocrinology Telehealth Visit Follow up Note -During COVID -19 Pandemic  I connected with Dawn Church on 05/18/2019   by telephone and verified that I am speaking with the correct person using two identifiers. Dawn Church, May 12, 1928. she has verbally consented to this visit. All issues noted in this document were discussed and addressed. The format was not optimal for physical exam.     05/18/2019, 3:13 PM  Dawn Church is a 85 y.o.-year-old female, referred by her  Alycia Rossetti, MD  , for evaluation for hypercalcemia/hyperparathyroidism.   Past Medical History:  Diagnosis Date  . Anemia   . Ankle swelling   . Cancer ([place]) 06/22/2016   left breast DCIS  . CVA (cerebral infarction)   . Epigastric pain   . GERD (gastroesophageal reflux disease)   . Hemochromatosis   . Hx of seasonal allergies   . Hypercholesteremia   . Hypertension   . Prolonged QT interval   . Stomach ulcer   . Stroke ([place]) 2015  . TIA (transient ischemic attack)     Past Surgical History:  Procedure Laterality Date  . APPENDECTOMY    . BREAST BIOPSY     Bilateral  . BREAST BIOPSY     bilateral  . BREAST LUMPECTOMY WITH RADIOACTIVE SEED LOCALIZATION Left 08/19/2016   Procedure: LEFT BREAST LUMPECTOMY WITH RADIOACTIVE SEED LOCALIZATION;  Surgeon: Excell Seltzer, MD;  Location: [facility];  Service: General;  Laterality: Left;  . CATARACT EXTRACTION, BILATERAL    . ESOPHAGOGASTRODUODENOSCOPY  12/20/2010  . SHOULDER OPEN ROTATOR CUFF REPAIR     right   . VAGINAL HYSTERECTOMY      Social History   Tobacco Use  . Smoking status: Never Smoker  . Smokeless tobacco: Never Used  Substance Use Topics  . Alcohol use: No  . Drug use: No    Family History  Problem Relation Age of Onset  . Diabetes Other   . Cancer Other   . Cancer Daughter   . Colon cancer Neg Hx      Outpatient Encounter Medications as of 05/18/2019  Medication Sig  . Acetaminophen (APAP ARTHRITIS PO) Take 2 tablets by mouth as needed.  Marland Kitchen amLODipine (NORVASC) 5 MG tablet TAKE 1 TABLET BY MOUTH EVERY DAY IN THE MORNING  . aspirin EC 81 MG tablet Take 2 tablets (162 mg total) by mouth daily.  Marland Kitchen atorvastatin (LIPITOR) 10 MG tablet TAKE 1 TABLET BY MOUTH EVERYDAY AT BEDTIME  . diclofenac sodium (VOLTAREN) 1 % GEL Apply to affected areas tree times a day  . fluticasone (FLONASE) 50 MCG/ACT nasal spray SPRAY 2 SPRAYS INTO EACH NOSTRIL EVERY DAY  . letrozole (FEMARA) 2.5 MG tablet TAKE 1 TABLET BY MOUTH EVERY DAY  . Multiple Vitamin (MULTIVITAMIN) tablet Take 1 tablet by mouth daily.    Marland Kitchen nystatin (MYCOSTATIN/NYSTOP) powder APPLY TO AFFECTED AREA 4 TIMES A DAY  . omega-3 acid ethyl esters (LOVAZA) 1 g capsule  TAKE 1 CAPSULE BY MOUTH TWICE A DAY  . pantoprazole (PROTONIX) 40 MG tablet TAKE 1 TABLET (40 MG TOTAL) BY MOUTH DAILY BEFORE BREAKFAST.  . ramipril (ALTACE) 10 MG capsule TAKE 1 CAPSULE BY MOUTH 2 TIMES DAILY.  Marland Kitchen triamterene-hydrochlorothiazide (MAXZIDE-25) 37.5-25 MG tablet TAKE 1 TABLET BY MOUTH EVERY DAY   No facility-administered encounter medications on file as of 05/18/2019.     Allergies  Allergen Reactions  . Codeine Other (See Comments)    Caused patient to "space out"  . Ultram [Tramadol]     nausea     HPI  Dawn Church was diagnosed with hypercalcemia in May 2018 when it was 10.7 mg per DL.  Patient has no previously known history of parathyroid, pituitary, adrenal dysfunctions; no family history of such dysfunctions. More recently she had a series of elevated calcium levels ranging from 10.6-11.4. -The most recent calcium level was 10.8 with a corresponding PTH of 129. -Was unable to do her 24-hour urine studies. I reviewed pt's DEXA scans: She has normal DEXA scan on December 18, 2016.  No prior history of fragility fractures or falls. No history of  kidney  stones.  No history of CKD. Last BUN/Cr: 26/0.88  she is  on HCTZ combined with triamterene 37.5/25 mg for hypertension.    No history of  vitamin D deficiency. Last vitamin D level was 28 in December 2019.   she has taken calcium supplement in the past, was advised to discontinue in June 2020.  she eats dairy and green, leafy, vegetables on average amounts.  she does not have a family history of hypercalcemia, pituitary tumors, thyroid cancer, or osteoporosis.  She denies loss of height.  She walks with a walker for stability. -She is hard of hearing, mild cognitive deficit.  She was assisted by her grown daughter.    ROS: Limited as above.  PE: LMP  (LMP Unknown) , There is no height or weight on file to calculate BMI. Wt Readings from Last 3 Encounters:  04/28/19 158 lb (71.7 kg)  02/08/19 159 lb (72.1 kg)  08/18/18 163 lb (73.9 kg)      CMP ( most recent) CMP     Component Value Date/Time   NA 144 02/08/2019 1253   NA 144 01/02/2017 1057   K 4.1 02/08/2019 1253   K 3.9 01/02/2017 1057   CL 108 02/08/2019 1253   CO2 26 02/08/2019 1253   CO2 27 01/02/2017 1057   GLUCOSE 95 02/08/2019 1253   GLUCOSE 119 01/02/2017 1057   BUN 26 (H) 02/08/2019 1253   BUN 15.7 01/02/2017 1057   CREATININE 0.88 02/08/2019 1253   CREATININE 0.9 01/02/2017 1057   CALCIUM 10.8 (H) 04/28/2019 1211   CALCIUM 10.7 (H) 01/02/2017 1057   PROT 6.4 02/08/2019 1253   PROT 7.0 01/02/2017 1057   ALBUMIN 4.0 04/30/2017 1147   ALBUMIN 3.7 01/02/2017 1057   AST 15 02/08/2019 1253   AST 22 01/02/2017 1057   ALT 7 02/08/2019 1253   ALT 15 01/02/2017 1057   ALKPHOS 67 04/30/2017 1147   ALKPHOS 81 01/02/2017 1057   BILITOT 0.5 02/08/2019 1253   BILITOT 0.43 01/02/2017 1057   GFRNONAA 50 (L) 08/18/2018 1049   GFRAA 58 (L) 08/18/2018 1049    Lab Results  Component Value Date   HGBA1C 6.0 (H) 08/18/2018   HGBA1C  03/14/2009    5.6 (NOTE) The ADA recommends the following therapeutic goal for  glycemic control related to Hgb  A1c measurement: Goal of therapy: <6.5 Hgb A1c  Reference: American Diabetes Association: Clinical Practice Recommendations 2010, Diabetes Care, 2010, 33: (Suppl  1).   Lipid Panel     Component Value Date/Time   CHOL 193 08/18/2018 1049   TRIG 149 08/18/2018 1049   HDL 71 08/18/2018 1049   CHOLHDL 2.7 08/18/2018 1049   VLDL 31 (H) 04/23/2016 1156   LDLCALC 97 08/18/2018 1049      Lab Results  Component Value Date   TSH 1.362 04/08/2013   TSH 2.328 01/06/2013   TSH 1.554 09/24/2012    Results for Dawn, Church (MRN [id number]) as of 05/18/2019 15:14  Ref. Range 04/28/2019 12:11  PTH, Intact Latest Ref Range: 14 - 64 pg/mL 129 (H)  PTH-Related Protein (PTH-RP) Latest Ref Range: 14 - 27 pg/mL 9 (L)   Assessment: 1. Hypercalcemia / Hyperparathyroidism  Plan: Patient has had several instances of elevated calcium, with the highest level being at 11.4 mg/dL. A corresponding intact PTH level was also high, at 124.  - Patient   has  Mild vitamin D deficiency,  with the last level being 28, currently on multivitamins. -Her most recent labs show stable calcium at 10.8 with still elevated PTH at 129.  She was unable to complete 24-hour urine studies.  - no history of  nephrolithiasis,  osteoporosis,fragility fractures. No abdominal pain, no major mood disorders, no bone pain. -Her labs are consistent with low PTH RP and ACE levels, making  malignancy related hypercalcemia is unlikely.    -She is not a surgical candidate even if she is confirmed to have primary hyperparathyroidism. -She will be continued on observation only.  If her next visit labs show calcium above 11.5, she will be considered for Sensipar therapy. -She will continue to benefit from hydrochlorothiazide therapy, did not change her blood pressure medication.  Time for this visit: 15 minutes. Dawn Church  participated in the discussions, expressed understanding, and voiced agreement  with the above plans.  All questions were answered to her satisfaction. she is encouraged to contact clinic should she have any questions or concerns prior to her return visit.   - Return in about 4 months (around 09/17/2019) for Follow up with Pre-visit Labs.   Glade Lloyd, MD [facility] [address] [place], [place] [address] Phone: [phone]  Fax: [phone]    This note was partially dictated with voice recognition software. Similar sounding words can be transcribed inadequately or may not  be corrected upon review.  05/18/2019, 3:13 PM

## 2019-05-31 ENCOUNTER — Other Ambulatory Visit: Payer: Self-pay | Admitting: Family Medicine

## 2019-06-01 ENCOUNTER — Ambulatory Visit (INDEPENDENT_AMBULATORY_CARE_PROVIDER_SITE_OTHER): Payer: Medicare Other

## 2019-06-01 ENCOUNTER — Other Ambulatory Visit: Payer: Self-pay

## 2019-06-01 DIAGNOSIS — Z23 Encounter for immunization: Secondary | ICD-10-CM | POA: Diagnosis not present

## 2019-06-12 ENCOUNTER — Other Ambulatory Visit: Payer: Self-pay | Admitting: Family Medicine

## 2019-06-29 ENCOUNTER — Other Ambulatory Visit: Payer: Self-pay | Admitting: Family Medicine

## 2019-07-04 ENCOUNTER — Other Ambulatory Visit: Payer: Self-pay | Admitting: Family Medicine

## 2019-08-16 ENCOUNTER — Other Ambulatory Visit: Payer: Self-pay | Admitting: Family Medicine

## 2019-08-17 NOTE — Progress Notes (Signed)
Patient Care Team: Brooks, Modena Nunnery, MD as PCP - General (Family Medicine) Delice Bison, Charlestine Massed, NP as Nurse Practitioner (Hematology and Oncology) Excell Seltzer, MD (Inactive) as Consulting Physician (General Surgery) Nicholas Lose, MD as Consulting Physician (Hematology and Oncology) Kyung Rudd, MD as Consulting Physician (Radiation Oncology)  DIAGNOSIS:    ICD-10-CM   1. Malignant neoplasm of upper-outer quadrant of left breast in female, estrogen receptor positive ([place])  C50.412    Z17.0     SUMMARY OF ONCOLOGIC HISTORY: Oncology History  Breast cancer of upper-outer quadrant of left female breast ([place])  06/10/2016 Initial Diagnosis   Left breast biopsy 2:00 position: Grade 2 IDC with DCIS, ER 100%, PR 100%, Ki-67 5%, HER-2 negative ratio 1.05, 1.2 cm irregular mass left breast 2:00 position 8 cm from nipple, T1c N0 stage IA clinical stage   08/19/2016 Surgery   Left Lumpectomy: IDC with DCIS 1.3 cm, DCIS close to 0.1 cm to all margins;ER 100%, PR 100%, Ki-67 5%, HER-2 negative ratio 1.05, T1cNx (Stage 1A)   09/22/2016 - 10/20/2016 Radiation Therapy   Radiation [name]): 1)Left breast/ 42.5 Gy in 17 fractions.  2) Left breast boost/ 10 Gy in 4 fractions   12/2016 -  Anti-estrogen oral therapy   Letrozole 2.5 mg daily     CHIEF COMPLIANT: Follow-up on letrozole therapy  INTERVAL HISTORY: Dawn Church is a 85 y.o. with above-mentioned history of left breast cancer treated with lumpectomy, radiation, and who is currently on antiestrogen therapy with letrozole. She presents to the clinic today for annual follow-up.   REVIEW OF SYSTEMS:   Constitutional: Denies fevers, chills or abnormal weight loss Eyes: Denies blurriness of vision Ears, nose, mouth, throat, and face: Denies mucositis or sore throat Respiratory: Denies cough, dyspnea or wheezes Cardiovascular: Denies palpitation, chest discomfort Gastrointestinal: Denies nausea, heartburn or change in bowel  habits Skin: Denies abnormal skin rashes Lymphatics: Denies new lymphadenopathy or easy bruising Neurological: Denies numbness, tingling or new weaknesses Behavioral/Psych: Mood is stable, no new changes  Extremities: No lower extremity edema Breast: denies any pain or lumps or nodules in either breasts All other systems were reviewed with the patient and are negative.  I have reviewed the past medical history, past surgical history, social history and family history with the patient and they are unchanged from previous note.  ALLERGIES:  is allergic to codeine and ultram [tramadol].  MEDICATIONS:  Current Outpatient Medications  Medication Sig Dispense Refill  . Acetaminophen (APAP ARTHRITIS PO) Take 2 tablets by mouth as needed.    Marland Kitchen amLODipine (NORVASC) 5 MG tablet TAKE 1 TABLET BY MOUTH EVERY DAY IN THE MORNING 90 tablet 2  . aspirin EC 81 MG tablet Take 2 tablets (162 mg total) by mouth daily.    Marland Kitchen atorvastatin (LIPITOR) 10 MG tablet TAKE 1 TABLET BY MOUTH EVERYDAY AT BEDTIME 90 tablet 2  . diclofenac sodium (VOLTAREN) 1 % GEL Apply to affected areas tree times a day 1 Tube 2  . fluticasone (FLONASE) 50 MCG/ACT nasal spray SPRAY 2 SPRAYS INTO EACH NOSTRIL EVERY DAY 48 g 3  . letrozole (FEMARA) 2.5 MG tablet TAKE 1 TABLET BY MOUTH EVERY DAY 90 tablet 3  . Multiple Vitamin (MULTIVITAMIN) tablet Take 1 tablet by mouth daily.      Marland Kitchen nystatin (MYCOSTATIN/NYSTOP) powder APPLY TO AFFECTED AREA 4 TIMES A DAY 60 g 2  . omega-3 acid ethyl esters (LOVAZA) 1 g capsule TAKE 1 CAPSULE BY MOUTH TWICE A DAY 180 capsule 1  .  pantoprazole (PROTONIX) 40 MG tablet TAKE 1 TABLET BY MOUTH DAILY BEFORE BREAKFAST 90 tablet 1  . ramipril (ALTACE) 10 MG capsule TAKE 1 CAPSULE BY MOUTH 2 TIMES DAILY. 180 capsule 2  . triamterene-hydrochlorothiazide (MAXZIDE-25) 37.5-25 MG tablet TAKE 1 TABLET BY MOUTH EVERY DAY 90 tablet 2   No current facility-administered medications for this visit.    PHYSICAL  EXAMINATION: ECOG PERFORMANCE STATUS: 1 - Symptomatic but completely ambulatory  There were no vitals filed for this visit. There were no vitals filed for this visit.  GENERAL: alert, no distress and comfortable SKIN: skin color, texture, turgor are normal, no rashes or significant lesions EYES: normal, Conjunctiva are pink and non-injected, sclera clear OROPHARYNX: no exudate, no erythema and lips, buccal mucosa, and tongue normal  NECK: supple, thyroid normal size, non-tender, without nodularity LYMPH: no palpable lymphadenopathy in the cervical, axillary or inguinal LUNGS: clear to auscultation and percussion with normal breathing effort HEART: regular rate & rhythm and no murmurs and no lower extremity edema ABDOMEN: abdomen soft, non-tender and normal bowel sounds MUSCULOSKELETAL: no cyanosis of digits and no clubbing  NEURO: alert & oriented x 3 with fluent speech, no focal motor/sensory deficits EXTREMITIES: No lower extremity edema BREAST: No palpable masses or nodules in either right or left breasts. No palpable axillary supraclavicular or infraclavicular adenopathy no breast tenderness or nipple discharge. (exam performed in the presence of a chaperone)  LABORATORY DATA:  I have reviewed the data as listed CMP Latest Ref Rng & Units 04/28/2019 03/16/2019 02/08/2019  Glucose 65 - 99 mg/dL - - -  BUN 7 - 25 mg/dL - - -  Creatinine 0.60 - 0.88 mg/dL - - -  Sodium 135 - 146 mmol/L - - -  Potassium 3.5 - 5.3 mmol/L - - -  Chloride 98 - 110 mmol/L - - -  CO2 20 - 32 mmol/L - - -  Calcium 8.6 - 10.4 mg/dL 10.8(H) 10.9(H) 11.4(H)  Total Protein 6.1 - 8.1 g/dL - - -  Total Bilirubin 0.2 - 1.2 mg/dL - - -  Alkaline Phos 33 - 130 U/L - - -  AST 10 - 35 U/L - - -  ALT 6 - 29 U/L - - -    Lab Results  Component Value Date   WBC 6.1 08/18/2018   HGB 12.2 08/18/2018   HCT 35.9 08/18/2018   MCV 99.4 08/18/2018   PLT 223 08/18/2018   NEUTROABS 4,477 08/18/2018    ASSESSMENT &  PLAN:  Breast cancer of upper-outer quadrant of left female breast ([place]) Left Lumpectomy 08/19/16: IDC with DCIS 1.3 cm, DCIS close to 0.1 cm to all margins;ER 100%, PR 100%, Ki-67 5%, HER-2 negative ratio 1.05, T1cNx (Stage 1A) Adj XRT completed 10/20/16  Current treatment: Letrozole 2.5 mg daily started05/11/2016 Bone density 12/19/2016: Normal T score 0.4  Letrozole toxicities: Denies any hot flashes or myalgias. Slight discomfort in the right hip intermittently.  Breast cancer surveillance: Breast exam 08/18/2019: Benign Bone density 12/18/2016 T score 0.4: Normal Mammograms she has not had a mammogram in the last couple of years but she would like to get one.  I ordered 1 to be done at [facility] in March 2021.  After that she can have mammograms on an as-needed basis.  She will turn 89 next year. I renewed her prescription for letrozole.  Hypertension: She will follow with her primary care physician.  Return to clinic in 1 year for follow-up    No orders of  the defined types were placed in this encounter.  The patient has a good understanding of the overall plan. she agrees with it. she will call with any problems that may develop before the next visit here.  Nicholas Lose, MD 08/18/2019  Julious Oka Dorshimer, am acting as scribe for Dr. Nicholas Lose.  I have reviewed the above document for accuracy and completeness, and I agree with the above.

## 2019-08-18 ENCOUNTER — Inpatient Hospital Stay: Payer: Medicare Other | Attending: Hematology and Oncology | Admitting: Hematology and Oncology

## 2019-08-18 ENCOUNTER — Other Ambulatory Visit: Payer: Self-pay

## 2019-08-18 DIAGNOSIS — Z888 Allergy status to other drugs, medicaments and biological substances status: Secondary | ICD-10-CM | POA: Diagnosis not present

## 2019-08-18 DIAGNOSIS — I10 Essential (primary) hypertension: Secondary | ICD-10-CM | POA: Insufficient documentation

## 2019-08-18 DIAGNOSIS — C50412 Malignant neoplasm of upper-outer quadrant of left female breast: Secondary | ICD-10-CM | POA: Diagnosis present

## 2019-08-18 DIAGNOSIS — Z79811 Long term (current) use of aromatase inhibitors: Secondary | ICD-10-CM | POA: Diagnosis not present

## 2019-08-18 DIAGNOSIS — Z79899 Other long term (current) drug therapy: Secondary | ICD-10-CM | POA: Insufficient documentation

## 2019-08-18 DIAGNOSIS — Z885 Allergy status to narcotic agent status: Secondary | ICD-10-CM | POA: Diagnosis not present

## 2019-08-18 DIAGNOSIS — Z170 Estrogen receptor positive status [ER+]: Secondary | ICD-10-CM | POA: Diagnosis not present

## 2019-08-18 MED ORDER — LETROZOLE 2.5 MG PO TABS: 2.5000 mg | ORAL_TABLET | Freq: Every day | ORAL | 3 refills | Status: DC

## 2019-08-18 NOTE — Assessment & Plan Note (Addendum)
Left Lumpectomy 08/19/16: IDC with DCIS 1.3 cm, DCIS close to 0.1 cm to all margins;ER 100%, PR 100%, Ki-67 5%, HER-2 negative ratio 1.05, T1cNx (Stage 1A) Adj XRT completed 10/20/16  Current treatment: Letrozole 2.5 mg daily started05/11/2016 Bone density 12/19/2016: Normal T score 0.4  Letrozole toxicities: Denies any hot flashes or myalgias. Slight discomfort in the right hip intermittently.  Breast cancer surveillance: Breast exam 08/18/2019: Benign Bone density 12/18/2016 T score 0.4: Normal Mammograms   Hypertension: She will follow with her primary care physician.  Return to clinic in 1 year for follow-up

## 2019-08-19 ENCOUNTER — Telehealth: Payer: Self-pay | Admitting: Hematology and Oncology

## 2019-08-19 NOTE — Telephone Encounter (Signed)
I talk with patient regarding schedule  

## 2019-09-11 ENCOUNTER — Other Ambulatory Visit: Payer: Self-pay | Admitting: Family Medicine

## 2019-09-15 DIAGNOSIS — E210 Primary hyperparathyroidism: Secondary | ICD-10-CM | POA: Diagnosis not present

## 2019-09-16 LAB — PTH, INTACT AND CALCIUM
Calcium: 11.4 mg/dL — ABNORMAL HIGH (ref 8.6–10.4)
PTH: 112 pg/mL — ABNORMAL HIGH (ref 14–64)

## 2019-09-20 ENCOUNTER — Ambulatory Visit (INDEPENDENT_AMBULATORY_CARE_PROVIDER_SITE_OTHER): Payer: Medicare PPO | Admitting: "Endocrinology

## 2019-09-20 ENCOUNTER — Encounter: Payer: Self-pay | Admitting: "Endocrinology

## 2019-09-20 DIAGNOSIS — E210 Primary hyperparathyroidism: Secondary | ICD-10-CM | POA: Diagnosis not present

## 2019-09-20 MED ORDER — CINACALCET HCL 30 MG PO TABS: 30.0000 mg | ORAL_TABLET | Freq: Every day | ORAL | 3 refills | Status: DC

## 2019-09-20 NOTE — Progress Notes (Signed)
09/20/2019                                                        Endocrinology Telehealth Visit Follow up Note -During COVID -19 Pandemic  I connected with Dawn Church on 09/20/2019   by telephone and verified that I am speaking with the correct person using two identifiers. Dawn Church, [birth year]. she has verbally consented to this visit. All issues noted in this document were discussed and addressed. The format was not optimal for physical exam.  Dawn Church is a 86 y.o.-year-old female, referred by her  Alycia Rossetti, MD  , for evaluation for hypercalcemia/hyperparathyroidism.   Past Medical History:  Diagnosis Date  . Anemia   . Ankle swelling   . Cancer ([place]) 06/22/2016   left breast DCIS  . CVA (cerebral infarction)   . Epigastric pain   . GERD (gastroesophageal reflux disease)   . Hemochromatosis   . Hx of seasonal allergies   . Hypercholesteremia   . Hypertension   . Prolonged QT interval   . Stomach ulcer   . Stroke ([place]) 2015  . TIA (transient ischemic attack)     Past Surgical History:  Procedure Laterality Date  . APPENDECTOMY    . BREAST BIOPSY     Bilateral  . BREAST BIOPSY     bilateral  . BREAST LUMPECTOMY WITH RADIOACTIVE SEED LOCALIZATION Left 08/19/2016   Procedure: LEFT BREAST LUMPECTOMY WITH RADIOACTIVE SEED LOCALIZATION;  Surgeon: Excell Seltzer, MD;  Location: [facility];  Service: General;  Laterality: Left;  . CATARACT EXTRACTION, BILATERAL    . ESOPHAGOGASTRODUODENOSCOPY  12/20/2010  . SHOULDER OPEN ROTATOR CUFF REPAIR     right   . VAGINAL HYSTERECTOMY      Social History   Tobacco Use  . Smoking status: Never Smoker  . Smokeless tobacco: Never Used  Substance Use Topics  . Alcohol use: No  . Drug use: No    Family History  Problem Relation Age of Onset  . Diabetes Other   . Cancer Other   . Cancer Daughter   . Colon cancer Neg Hx      Outpatient Encounter Medications as of 09/20/2019  Medication Sig  . Acetaminophen (APAP ARTHRITIS PO) Take 2 tablets by mouth as needed.  Marland Kitchen amLODipine (NORVASC) 5 MG tablet TAKE 1 TABLET BY MOUTH EVERY DAY IN THE MORNING  . aspirin EC 81 MG tablet Take 2 tablets (162 mg total) by mouth daily.  Marland Kitchen atorvastatin (LIPITOR) 10 MG tablet TAKE 1 TABLET BY MOUTH EVERYDAY AT BEDTIME  . cinacalcet (SENSIPAR) 30 MG tablet Take 1 tablet (30 mg total) by mouth daily with breakfast.  . diclofenac sodium (VOLTAREN) 1 % GEL Apply to affected areas tree times a day  . fluticasone (FLONASE) 50 MCG/ACT nasal spray SPRAY 2 SPRAYS INTO EACH NOSTRIL EVERY DAY  . letrozole (FEMARA) 2.5 MG tablet Take 1 tablet (2.5 mg total) by mouth daily.  Marland Kitchen  Multiple Vitamin (MULTIVITAMIN) tablet Take 1 tablet by mouth daily.    Marland Kitchen nystatin (MYCOSTATIN/NYSTOP) powder APPLY TO AFFECTED AREA 4 TIMES A DAY  . omega-3 acid ethyl esters (LOVAZA) 1 g capsule TAKE 1 CAPSULE BY MOUTH TWICE A DAY  . pantoprazole (PROTONIX) 40 MG tablet TAKE 1 TABLET BY MOUTH DAILY BEFORE BREAKFAST  . ramipril (ALTACE) 10 MG capsule TAKE 1 CAPSULE BY MOUTH 2 TIMES DAILY.  Marland Kitchen triamterene-hydrochlorothiazide (MAXZIDE-25) 37.5-25 MG tablet TAKE 1 TABLET BY MOUTH EVERY DAY   No facility-administered encounter medications on file as of 09/20/2019.    Allergies  Allergen Reactions  . Codeine Other (See Comments)    Caused patient to "space out"  . Ultram [Tramadol]     nausea     HPI  Dawn Church was diagnosed with hypercalcemia in May 2018 when it was 10.7 mg per DL.  She is being engaged in telehealth via telephone for follow-up of hypercalcemia.  She was put on observation during her last visit.  Her most recent previsit labs show hypercalcemia worsening to 11.4 mg per DL, associated with high PTH of 112.  -She still has no symptoms. -Was unable to do her 24-hour urine studies. I reviewed pt's DEXA scans: She has normal DEXA scan on December 18, 2016.  No prior history of fragility fractures or falls. No history of  kidney stones.  No history of CKD. Last BUN/Cr: 26/0.88  she is  on HCTZ combined with triamterene 37.5/25 mg for hypertension.    No history of  vitamin D deficiency. Last vitamin D level was 28 in December 2019.   she has taken calcium supplement in the past, was advised to discontinue in June 2020.  she eats dairy and green, leafy, vegetables on average amounts.  she does not have a family history of hypercalcemia, pituitary tumors, thyroid cancer, or osteoporosis.  She denies loss of height.  She walks with a walker for stability. -She is hard of hearing, mild cognitive deficit.  She was assisted by her grown daughter.    ROS: Limited as above.  PE: LMP  (LMP Unknown) , There is no height or weight on file to calculate BMI. Wt Readings from Last 3 Encounters:  08/18/19 153 lb 8 oz (69.6 kg)  04/28/19 158 lb (71.7 kg)  02/08/19 159 lb (72.1 kg)      CMP ( most recent) CMP     Component Value Date/Time   NA 144 02/08/2019 1253   NA 144 01/02/2017 1057   K 4.1 02/08/2019 1253   K 3.9 01/02/2017 1057   CL 108 02/08/2019 1253   CO2 26 02/08/2019 1253   CO2 27 01/02/2017 1057   GLUCOSE 95 02/08/2019 1253   GLUCOSE 119 01/02/2017 1057   BUN 26 (H) 02/08/2019 1253   BUN 15.7 01/02/2017 1057   CREATININE 0.88 02/08/2019 1253   CREATININE 0.9 01/02/2017 1057   CALCIUM 11.4 (H) 09/15/2019 1103   CALCIUM 10.7 (H) 01/02/2017 1057   PROT 6.4 02/08/2019 1253   PROT 7.0 01/02/2017 1057   ALBUMIN 4.0 04/30/2017 1147   ALBUMIN 3.7 01/02/2017 1057   AST 15 02/08/2019 1253   AST 22 01/02/2017 1057   ALT 7 02/08/2019 1253   ALT 15 01/02/2017 1057   ALKPHOS 67 04/30/2017 1147   ALKPHOS 81 01/02/2017 1057   BILITOT 0.5 02/08/2019 1253   BILITOT 0.43 01/02/2017 1057   GFRNONAA 50 (L) 08/18/2018 1049   GFRAA 58 (L) 08/18/2018 1049  Lab Results  Component Value Date   HGBA1C 6.0 (H) 08/18/2018   HGBA1C   03/14/2009    5.6 (NOTE) The ADA recommends the following therapeutic goal for glycemic control related to Hgb A1c measurement: Goal of therapy: <6.5 Hgb A1c  Reference: American Diabetes Association: Clinical Practice Recommendations 2010, Diabetes Care, 2010, 33: (Suppl  1).   Lipid Panel     Component Value Date/Time   CHOL 193 08/18/2018 1049   TRIG 149 08/18/2018 1049   HDL 71 08/18/2018 1049   CHOLHDL 2.7 08/18/2018 1049   VLDL 31 (H) 04/23/2016 1156   LDLCALC 97 08/18/2018 1049      Lab Results  Component Value Date   TSH 1.362 04/08/2013   TSH 2.328 01/06/2013   TSH 1.554 09/24/2012    Results for MAEBRY, MITNICK (MRN [id number]) as of 05/18/2019 15:14  Ref. Range 04/28/2019 12:11  PTH, Intact Latest Ref Range: 14 - 64 pg/mL 129 (H)  PTH-Related Protein (PTH-RP) Latest Ref Range: 14 - 27 pg/mL 9 (L)   Assessment: 1. Hypercalcemia / Hyperparathyroidism  Plan: Patient has had several instances of elevated calcium, with the highest level being at 11.4 mg/dL. A corresponding intact PTH level was also high, at 112.  - Patient   has  Mild vitamin D deficiency,  with the last level being 28, currently on multivitamins. -  She was unable to complete 24-hour urine studies.  - no history of  nephrolithiasis,  osteoporosis,fragility fractures. No abdominal pain, no major mood disorders, no bone pain. -Her labs are consistent with low PTH RP and ACE levels, making  malignancy related hypercalcemia is unlikely.    -She is not a surgical candidate even if she is confirmed to have primary hyperparathyroidism. -She will benefit from Sensipar therapy.  I discussed and initiated Sensipar 30 mg p.o. daily with breakfast with plan to repeat her PTH/calcium in 3 months with office visit.    -She will continue to benefit from hydrochlorothiazide therapy, did not change her blood pressure medication.     - Time spent on this patient care encounter:  25 minutes of which 50% was spent in   counseling and the rest reviewing  her current and  previous labs / studies and medications  doses and developing a plan for long term care. Dawn Church  participated in the discussions, expressed understanding, and voiced agreement with the above plans.  All questions were answered to her satisfaction. she is encouraged to contact clinic should she have any questions or concerns prior to her return visit.  - Return in about 3 months (around 12/19/2019) for Follow up with Pre-visit Labs.   Glade Lloyd, MD [facility] [address] [place], [place] [address] Phone: [phone]  Fax: [phone]    This note was partially dictated with voice recognition software. Similar sounding words can be transcribed inadequately or may not  be corrected upon review.  09/20/2019, 4:42 PM

## 2019-10-14 ENCOUNTER — Encounter: Payer: Self-pay | Admitting: Family Medicine

## 2019-10-14 ENCOUNTER — Other Ambulatory Visit: Payer: Self-pay

## 2019-10-14 ENCOUNTER — Ambulatory Visit (INDEPENDENT_AMBULATORY_CARE_PROVIDER_SITE_OTHER): Payer: Medicare PPO | Admitting: Family Medicine

## 2019-10-14 VITALS — BP 148/86 | HR 78 | Temp 97.9°F | Resp 16 | Ht 63.0 in | Wt 153.0 lb

## 2019-10-14 DIAGNOSIS — E7800 Pure hypercholesterolemia, unspecified: Secondary | ICD-10-CM

## 2019-10-14 DIAGNOSIS — Z8673 Personal history of transient ischemic attack (TIA), and cerebral infarction without residual deficits: Secondary | ICD-10-CM

## 2019-10-14 DIAGNOSIS — M159 Polyosteoarthritis, unspecified: Secondary | ICD-10-CM | POA: Diagnosis not present

## 2019-10-14 DIAGNOSIS — R634 Abnormal weight loss: Secondary | ICD-10-CM

## 2019-10-14 DIAGNOSIS — I10 Essential (primary) hypertension: Secondary | ICD-10-CM | POA: Diagnosis not present

## 2019-10-14 DIAGNOSIS — R2681 Unsteadiness on feet: Secondary | ICD-10-CM | POA: Diagnosis not present

## 2019-10-14 DIAGNOSIS — R4189 Other symptoms and signs involving cognitive functions and awareness: Secondary | ICD-10-CM | POA: Diagnosis not present

## 2019-10-14 NOTE — Progress Notes (Signed)
Subjective:    Patient ID: Dawn Church, female    DOB: 07-Jan-1928, 86 y.o.   MRN: [id number]  Patient presents for Follow-up (is not fasting) Patient here to follow-up chronic medical problems.  She is here today with her daughter.  Hypertension she is taking her blood pressure medicine Maxide and amlodipine/ ramipril  as prescribed no side effects with the medication.  Constipation taking over-the-counter laxative we tried other medications in the past that has not helped.  She is followed by endocrinology secondary to hyperparathyroidism and hypercalcemia.  She is not on any calcium supplements.  She is not a surgical candidate for primary hyperparathyroidism so she was started on Sensipar 30 mg daily.  Recommended that she also continue her blood pressure medication as it does have a diuretic in it.    She is followed by oncology secondary to history of breast cancer.  Her last visit was in December 2020 she still on antiestrogen oral therapy daily  Hyperlipidemia she is taking fish oil as well as Lipitor 10 mg at bedtime  Reflux she is on pantoprazole once a day  Chronic arthritic pain/degenerative disc disease .  She takes acetaminophen and also uses topical Voltaren gel as needed.  Weight is down 6 pounds since her last visit in June 2020.  States that she is eating.  Then there has been significant stressors as her children have been trying to help her more at home because of some cognitive changes.  She was getting fearful in middle of the night and she will walk over to her daughter's house in the middle the night sometimes without any shoes.  Now her younger daughter has moved in with her which is stopped that behavior.  She gets very irritable with her daughter Dawn Church who has been her primary caregiver for many years.  Dawn Church has been trying to help with her medication dispensable and she gets upset about that, they have found the wrong pills in different bottles, and  found pills on the floor.  She has been trying to help her with cooking and cleaning but she often does not want to eat what she makes.  She has been giving her Ensure to help supplement and often if she asked for anything particular she will go get that food for her.  They have been trying to get power of attorney situated.  Ms. Dawn Church today states that Dawn Church is power of attorney she has it written up somewhere but she has refused to give anyone the paperwork regarding this.  There has been some tension within the household trying to get her affairs in order especially with her holding onto the will and paperwork from the lawyer.  Review Of Systems:  GEN- denies fatigue, fever, weight loss,weakness, recent illness HEENT- denies eye drainage, change in vision, nasal discharge, CVS- denies chest pain, palpitations RESP- denies SOB, cough, wheeze ABD- denies N/V, change in stools, abd pain GU- denies dysuria, hematuria, dribbling, incontinence MSK-+ joint pain, muscle aches, injury Neuro- denies headache, dizziness, syncope, seizure activity       Objective:    BP (!) 148/86   Pulse 78   Temp 97.9 F (36.6 C) (Temporal)   Resp 16   Ht 5\' 3"  (1.6 m)   Wt 153 lb (69.4 kg)   LMP  (LMP Unknown)   SpO2 99%   BMI 27.10 kg/m  GEN- NAD, alert and oriented x3, examined in the chair, walks with rollator  HEENT- PERRL, EOMI,  non injected sclera, pink conjunctiva, Neck- Supple, no thyromegaly CVS- RRR, no murmur RESP-CTAB ABD-NABS,soft,NT,ND EXT- trace ankle edema Pulses- Radial,  2+        Assessment & Plan:      Problem List Items Addressed This Visit      Unprioritized   Cognitive decline    He is already displaying some cognitive decline most of which were just related to her age and general.  But she does have history of stroke and hypertension so likely has a vascular component to it.  I discussed with her the importance of her family members helping her with her meals her  medications transportation etc.  I think it is difficult for her to swallow losing this independence which is very common at her age.  She is willing to provide the papers to her daughter with regards to power of attorney will also get [place] a copy.  At this time not to start any other new medications.  We did discuss the weight loss her weight is down 6 pounds since this past summer.  We will try to increase her protein seems that she is very picky with regards to what she wants to eat.  She is taking boost or Ensure typically most days.      Essential hypertension, benign - Primary    Blood pressure is controlled for her age and the fact that she requires multiple agents.  She typically fluctuates between Q000111Q to Q000111Q systolic.  We will check her fasting labs today.      Relevant Orders   CBC with Differential/Platelet   Comprehensive metabolic panel   Gait instability   Generalized OA    Continue acetaminophen and topicals.  She has a rolling walker to help with gait.      History of stroke   Hyperlipidemia    Check fasting labs.  She is on statin drug.  We will also check her liver function.      Relevant Orders   Lipid panel    Other Visit Diagnoses    Weight loss, unintentional          Note: This dictation was prepared with Dragon dictation along with smaller phrase technology. Any transcriptional errors that result from this process are unintentional.

## 2019-10-14 NOTE — Assessment & Plan Note (Signed)
Check fasting labs.  She is on statin drug.  We will also check her liver function.

## 2019-10-14 NOTE — Assessment & Plan Note (Signed)
Continue acetaminophen and topicals.  She has a rolling walker to help with gait.

## 2019-10-14 NOTE — Patient Instructions (Signed)
F/U  4 MONTHS for PHYSICAL We will call with lab results

## 2019-10-14 NOTE — Assessment & Plan Note (Signed)
Blood pressure is controlled for her age and the fact that she requires multiple agents.  She typically fluctuates between Q000111Q to Q000111Q systolic.  We will check her fasting labs today.

## 2019-10-14 NOTE — Assessment & Plan Note (Signed)
He is already displaying some cognitive decline most of which were just related to her age and general.  But she does have history of stroke and hypertension so likely has a vascular component to it.  I discussed with her the importance of her family members helping her with her meals her medications transportation etc.  I think it is difficult for her to swallow losing this independence which is very common at her age.  She is willing to provide the papers to her daughter with regards to power of attorney will also get [place] a copy.  At this time not to start any other new medications.  We did discuss the weight loss her weight is down 6 pounds since this past summer.  We will try to increase her protein seems that she is very picky with regards to what she wants to eat.  She is taking boost or Ensure typically most days.

## 2019-10-15 LAB — COMPREHENSIVE METABOLIC PANEL
AG Ratio: 1.9 (calc) (ref 1.0–2.5)
ALT: 8 U/L (ref 6–29)
AST: 17 U/L (ref 10–35)
Albumin: 4.4 g/dL (ref 3.6–5.1)
Alkaline phosphatase (APISO): 66 U/L (ref 37–153)
BUN: 18 mg/dL (ref 7–25)
CO2: 23 mmol/L (ref 20–32)
Calcium: 10.2 mg/dL (ref 8.6–10.4)
Chloride: 105 mmol/L (ref 98–110)
Creat: 0.82 mg/dL (ref 0.60–0.88)
Globulin: 2.3 g/dL (calc) (ref 1.9–3.7)
Glucose, Bld: 90 mg/dL (ref 65–99)
Potassium: 4 mmol/L (ref 3.5–5.3)
Sodium: 140 mmol/L (ref 135–146)
Total Bilirubin: 0.4 mg/dL (ref 0.2–1.2)
Total Protein: 6.7 g/dL (ref 6.1–8.1)

## 2019-10-15 LAB — CBC WITH DIFFERENTIAL/PLATELET
Absolute Monocytes: 509 cells/uL (ref 200–950)
Basophils Absolute: 8 cells/uL (ref 0–200)
Basophils Relative: 0.1 %
Eosinophils Absolute: 30 cells/uL (ref 15–500)
Eosinophils Relative: 0.4 %
HCT: 34.7 % — ABNORMAL LOW (ref 35.0–45.0)
Hemoglobin: 11.7 g/dL (ref 11.7–15.5)
Lymphs Abs: 1224 cells/uL (ref 850–3900)
MCH: 34.2 pg — ABNORMAL HIGH (ref 27.0–33.0)
MCHC: 33.7 g/dL (ref 32.0–36.0)
MCV: 101.5 fL — ABNORMAL HIGH (ref 80.0–100.0)
MPV: 9.9 fL (ref 7.5–12.5)
Monocytes Relative: 6.7 %
Neutro Abs: 5829 cells/uL (ref 1500–7800)
Neutrophils Relative %: 76.7 %
Platelets: 211 10*3/uL (ref 140–400)
RBC: 3.42 10*6/uL — ABNORMAL LOW (ref 3.80–5.10)
RDW: 12.7 % (ref 11.0–15.0)
Total Lymphocyte: 16.1 %
WBC: 7.6 10*3/uL (ref 3.8–10.8)

## 2019-10-15 LAB — LIPID PANEL
Cholesterol: 157 mg/dL (ref <200)
HDL: 67 mg/dL (ref >=50)
LDL Cholesterol (Calc): 65 mg/dL (calc)
Non-HDL Cholesterol (Calc): 90 mg/dL (calc) (ref <130)
Total CHOL/HDL Ratio: 2.3 (calc) (ref <5.0)
Triglycerides: 177 mg/dL — ABNORMAL HIGH (ref <150)

## 2019-10-16 ENCOUNTER — Ambulatory Visit: Payer: Medicare Other

## 2019-10-17 ENCOUNTER — Encounter: Payer: Self-pay | Admitting: *Deleted

## 2019-10-18 ENCOUNTER — Ambulatory Visit: Payer: Medicare PPO

## 2019-11-01 ENCOUNTER — Other Ambulatory Visit: Payer: Self-pay

## 2019-11-01 ENCOUNTER — Ambulatory Visit: Payer: Medicare PPO | Attending: Internal Medicine

## 2019-11-01 ENCOUNTER — Encounter: Payer: Self-pay | Admitting: Family Medicine

## 2019-11-01 DIAGNOSIS — Z23 Encounter for immunization: Secondary | ICD-10-CM | POA: Insufficient documentation

## 2019-11-06 ENCOUNTER — Other Ambulatory Visit: Payer: Self-pay | Admitting: Family Medicine

## 2019-11-08 ENCOUNTER — Other Ambulatory Visit: Payer: Self-pay | Admitting: Family Medicine

## 2019-11-29 ENCOUNTER — Ambulatory Visit: Payer: Medicare PPO | Attending: Internal Medicine

## 2019-11-29 DIAGNOSIS — Z23 Encounter for immunization: Secondary | ICD-10-CM

## 2019-11-29 NOTE — Progress Notes (Signed)
   Covid-19 Vaccination Clinic  Name:  Dawn Church    MRN: [id number] DOB: 1927-11-03  11/29/2019  [name] was observed post Covid-19 immunization for 15 minutes without incident. She was provided with Vaccine Information Sheet and instruction to access the V-Safe system.   [name] was instructed to call 911 with any severe reactions post vaccine: Marland Kitchen Difficulty breathing  . Swelling of face and throat  . A fast heartbeat  . A bad rash all over body  . Dizziness and weakness   Immunizations Administered    Name Date Dose VIS Date Route   Moderna COVID-19 Vaccine 11/29/2019  2:27 PM 0.5 mL 08/02/2019 Intramuscular   Manufacturer: Moderna   Lot: [id number]   [id number]

## 2019-12-04 ENCOUNTER — Ambulatory Visit: Payer: Medicare PPO

## 2019-12-11 ENCOUNTER — Other Ambulatory Visit: Payer: Self-pay | Admitting: "Endocrinology

## 2019-12-14 ENCOUNTER — Telehealth: Payer: Self-pay | Admitting: "Endocrinology

## 2019-12-14 NOTE — Telephone Encounter (Signed)
Dawn Church, does she need to do labs now and keep appt or after?

## 2019-12-14 NOTE — Telephone Encounter (Signed)
Do not worry about the 24 hr urine test,  continue sensipar. Will need repeat labs and keep appt.

## 2019-12-14 NOTE — Telephone Encounter (Signed)
Patient's daughter called and said she has an appt Monday, she said that she will be unable to do the 24 hour urine, she said she can never get her to do the 24 hour. What do you suggest?

## 2019-12-14 NOTE — Telephone Encounter (Signed)
Which labs? The only thing in there is the urine

## 2019-12-14 NOTE — Telephone Encounter (Signed)
What I see active is PTH intact with calcium

## 2019-12-14 NOTE — Telephone Encounter (Signed)
She needs labs prior to visit.

## 2019-12-16 DIAGNOSIS — E210 Primary hyperparathyroidism: Secondary | ICD-10-CM | POA: Diagnosis not present

## 2019-12-19 ENCOUNTER — Ambulatory Visit: Payer: Medicare PPO | Admitting: "Endocrinology

## 2019-12-19 LAB — PTH, INTACT AND CALCIUM
Calcium: 9.9 mg/dL (ref 8.6–10.4)
PTH: 114 pg/mL — ABNORMAL HIGH (ref 14–64)

## 2020-02-06 ENCOUNTER — Other Ambulatory Visit: Payer: Self-pay | Admitting: Family Medicine

## 2020-02-13 ENCOUNTER — Encounter: Payer: Medicare PPO | Admitting: Family Medicine

## 2020-02-22 ENCOUNTER — Other Ambulatory Visit: Payer: Self-pay | Admitting: Family Medicine

## 2020-03-04 ENCOUNTER — Other Ambulatory Visit: Payer: Self-pay | Admitting: "Endocrinology

## 2020-03-06 ENCOUNTER — Other Ambulatory Visit: Payer: Self-pay | Admitting: Family Medicine

## 2020-03-13 ENCOUNTER — Other Ambulatory Visit: Payer: Self-pay | Admitting: Family Medicine

## 2020-04-08 ENCOUNTER — Other Ambulatory Visit: Payer: Self-pay

## 2020-04-08 ENCOUNTER — Emergency Department (HOSPITAL_COMMUNITY): Payer: Medicare PPO

## 2020-04-08 ENCOUNTER — Emergency Department (HOSPITAL_COMMUNITY)
Admission: EM | Admit: 2020-04-08 | Discharge: 2020-04-08 | Disposition: A | Discharge status: Home / Self Care | Payer: Medicare PPO | Attending: Emergency Medicine | Admitting: Emergency Medicine

## 2020-04-08 ENCOUNTER — Encounter (HOSPITAL_COMMUNITY): Payer: Self-pay | Admitting: Emergency Medicine

## 2020-04-08 DIAGNOSIS — R1084 Generalized abdominal pain: Secondary | ICD-10-CM | POA: Diagnosis present

## 2020-04-08 DIAGNOSIS — K219 Gastro-esophageal reflux disease without esophagitis: Secondary | ICD-10-CM | POA: Insufficient documentation

## 2020-04-08 DIAGNOSIS — Z79899 Other long term (current) drug therapy: Secondary | ICD-10-CM | POA: Diagnosis not present

## 2020-04-08 DIAGNOSIS — Z7982 Long term (current) use of aspirin: Secondary | ICD-10-CM | POA: Diagnosis not present

## 2020-04-08 DIAGNOSIS — I10 Essential (primary) hypertension: Secondary | ICD-10-CM | POA: Diagnosis not present

## 2020-04-08 DIAGNOSIS — I700 Atherosclerosis of aorta: Secondary | ICD-10-CM | POA: Diagnosis not present

## 2020-04-08 DIAGNOSIS — R109 Unspecified abdominal pain: Secondary | ICD-10-CM | POA: Diagnosis not present

## 2020-04-08 DIAGNOSIS — K5900 Constipation, unspecified: Secondary | ICD-10-CM | POA: Diagnosis not present

## 2020-04-08 DIAGNOSIS — N898 Other specified noninflammatory disorders of vagina: Secondary | ICD-10-CM | POA: Diagnosis not present

## 2020-04-08 DIAGNOSIS — Z90710 Acquired absence of both cervix and uterus: Secondary | ICD-10-CM | POA: Diagnosis not present

## 2020-04-08 LAB — COMPREHENSIVE METABOLIC PANEL
ALT: 11 U/L (ref 0–44)
AST: 22 U/L (ref 15–41)
Albumin: 4.3 g/dL (ref 3.5–5.0)
Alkaline Phosphatase: 62 U/L (ref 38–126)
Anion gap: 11 (ref 5–15)
BUN: 12 mg/dL (ref 8–23)
CO2: 27 mmol/L (ref 22–32)
Calcium: 9.4 mg/dL (ref 8.9–10.3)
Chloride: 100 mmol/L (ref 98–111)
Creatinine, Ser: 0.74 mg/dL (ref 0.44–1.00)
GFR calc Af Amer: >60 mL/min (ref >60)
GFR calc non Af Amer: >60 mL/min (ref >60)
Glucose, Bld: 105 mg/dL — ABNORMAL HIGH (ref 70–99)
Potassium: 3.2 mmol/L — ABNORMAL LOW (ref 3.5–5.1)
Sodium: 138 mmol/L (ref 135–145)
Total Bilirubin: 0.6 mg/dL (ref 0.3–1.2)
Total Protein: 7 g/dL (ref 6.5–8.1)

## 2020-04-08 LAB — CBC
HCT: 35.1 % — ABNORMAL LOW (ref 36.0–46.0)
Hemoglobin: 11.8 g/dL — ABNORMAL LOW (ref 12.0–15.0)
MCH: 34.9 pg — ABNORMAL HIGH (ref 26.0–34.0)
MCHC: 33.6 g/dL (ref 30.0–36.0)
MCV: 103.8 fL — ABNORMAL HIGH (ref 80.0–100.0)
Platelets: 204 10*3/uL (ref 150–400)
RBC: 3.38 MIL/uL — ABNORMAL LOW (ref 3.87–5.11)
RDW: 13.2 % (ref 11.5–15.5)
WBC: 4.8 10*3/uL (ref 4.0–10.5)
nRBC: 0 % (ref 0.0–0.2)

## 2020-04-08 LAB — LIPASE, BLOOD: Lipase: 21 U/L (ref 11–51)

## 2020-04-08 MED ORDER — POTASSIUM CHLORIDE CRYS ER 20 MEQ PO TBCR
40.0000 meq | EXTENDED_RELEASE_TABLET | Freq: Once | ORAL | Status: AC
  Administered 2020-04-08: 40 meq via ORAL
  Filled 2020-04-08: qty 2

## 2020-04-08 MED ORDER — IOHEXOL 300 MG/ML  SOLN
80.0000 mL | Freq: Once | INTRAMUSCULAR | Status: AC | PRN
  Administered 2020-04-08: 80 mL via INTRAVENOUS

## 2020-04-08 NOTE — Discharge Instructions (Addendum)
Stay hydrated with water. Continue taking senna to help with constipation.  Please follow with your primary care. Return to the ED if you develop severely worsening abdominal pain, fever, or other worsening symptoms.

## 2020-04-08 NOTE — ED Triage Notes (Signed)
Pt reports intermittent abd pain x3 weeks. Pt family reports pt has not had a BM in several days.

## 2020-04-08 NOTE — ED Provider Notes (Signed)
[facility] EMERGENCY DEPARTMENT Provider Note   CSN: [id number] Arrival date & time: 04/08/20  [id number]     History Chief Complaint  Patient presents with  . Abdominal Pain    Dawn Church is a 86 y.o. female presenting to the ED with complaint of constipation.  Patient states she does not feel like she has had a full normal bowel movement in about 3 weeks.  She describes her symptoms as having abnormal bowel movements where she " can't push it out."  She states her stools are not hard.  Last BM was yesterday.  Her family member at bedside states she does not think she has been having changes in her bowel movements, she states when she uses the restroom she can tell she has had bowel movements while in the bathroom.  She states she has been at her mental baseline.  Patient also reports intermittent generalized abdominal pain at night when she sleeps with position changes.  She denies nausea or vomiting, urinary symptoms, fevers or chills.  Denies any new back pain or injuries. Past abdominal surgeries include appendectomy, vaginal hysterectomy.  The history is provided by the patient and a relative.       Past Medical History:  Diagnosis Date  . Anemia   . Ankle swelling   . Cancer ([place]) 06/22/2016   left breast DCIS  . CVA (cerebral infarction)   . Epigastric pain   . GERD (gastroesophageal reflux disease)   . Hemochromatosis   . Hx of seasonal allergies   . Hypercholesteremia   . Hypertension   . Prolonged QT interval   . Stomach ulcer   . Stroke ([place]) 2015  . TIA (transient ischemic attack)     Patient Active Problem List   Diagnosis Date Noted  . Cognitive decline 10/14/2019  . Hypercalcemia 04/28/2019  . Hyperparathyroidism, primary ([place]) 04/28/2019  . DDD (degenerative disc disease), lumbar 12/30/2017  . Weight loss 04/30/2017  . Breast cancer of upper-outer quadrant of left female breast ([place]) 07/14/2016  . Constipation 02/12/2016  . Gait instability  05/08/2015  . History of stroke 07/05/2014  . Seborrheic keratoses, inflamed 03/08/2014  . Decreased hearing 03/08/2014  . Generalized OA 04/11/2013  . Pes anserinus bursitis of left knee 12/28/2012  . Lower back pain 05/11/2012  . Essential hypertension, benign 04/17/2012  . Hip pain, bilateral 04/17/2012  . Hyperlipidemia 04/17/2012  . Iron deficiency anemia 04/17/2012    Past Surgical History:  Procedure Laterality Date  . APPENDECTOMY    . BREAST BIOPSY     Bilateral  . BREAST BIOPSY     bilateral  . BREAST LUMPECTOMY WITH RADIOACTIVE SEED LOCALIZATION Left 08/19/2016   Procedure: LEFT BREAST LUMPECTOMY WITH RADIOACTIVE SEED LOCALIZATION;  Surgeon: Excell Seltzer, MD;  Location: [place] [facility];  Service: General;  Laterality: Left;  . CATARACT EXTRACTION, BILATERAL    . ESOPHAGOGASTRODUODENOSCOPY  12/20/2010  . SHOULDER OPEN ROTATOR CUFF REPAIR     right   . VAGINAL HYSTERECTOMY       OB History    Gravida  10   Para  10   Term  10   Preterm      AB      Living  10     SAB      TAB      Ectopic      Multiple      Live Births              Family  History  Problem Relation Age of Onset  . Diabetes Other   . Cancer Other   . Cancer Daughter   . Colon cancer Neg Hx     Social History   Tobacco Use  . Smoking status: Never Smoker  . Smokeless tobacco: Never Used  Substance Use Topics  . Alcohol use: No  . Drug use: No    Home Medications Prior to Admission medications   Medication Sig Start Date End Date Taking? Authorizing Provider  Acetaminophen (APAP ARTHRITIS PO) Take 2 tablets by mouth as needed.   Yes [provider]  amLODipine (NORVASC) 5 MG tablet TAKE 1 TABLET BY MOUTH EVERY DAY IN THE MORNING 03/07/20  Yes [place], Modena Nunnery, MD  aspirin EC 81 MG tablet Take 2 tablets (162 mg total) by mouth daily. 12/26/16  Yes [place], Modena Nunnery, MD  atorvastatin (LIPITOR) 10 MG tablet TAKE 1 TABLET BY MOUTH EVERYDAY AT  BEDTIME 03/15/20  Yes [place], Modena Nunnery, MD  cinacalcet (SENSIPAR) 30 MG tablet TAKE 1 TABLET (30 MG TOTAL) BY MOUTH DAILY WITH BREAKFAST. 03/12/20  Yes [place], Marella Chimes, MD  diclofenac sodium (VOLTAREN) 1 % GEL Apply to affected areas tree times a day 01/05/18  Yes [place], Modena Nunnery, MD  fluticasone [facility]) 50 MCG/ACT nasal spray SPRAY 2 SPRAYS INTO EACH NOSTRIL EVERY DAY 09/12/19  Yes [place], Modena Nunnery, MD  letrozole [facility]) 2.5 MG tablet Take 1 tablet (2.5 mg total) by mouth daily. 08/18/19  Yes Nicholas Lose, MD  Multiple Vitamin (MULTIVITAMIN) tablet Take 1 tablet by mouth daily.     Yes [provider]  nystatin (MYCOSTATIN/NYSTOP) powder APPLY TO AFFECTED AREA 4 TIMES A DAY 11/02/18  Yes [place], Modena Nunnery, MD  omega-3 acid ethyl esters (LOVAZA) 1 g capsule TAKE 1 CAPSULE BY MOUTH TWICE A DAY 02/06/20  Yes [place], Modena Nunnery, MD  pantoprazole (PROTONIX) 40 MG tablet TAKE 1 TABLET BY MOUTH EVERY DAY BEFORE BREAKFAST 11/08/19  Yes [place], Modena Nunnery, MD  ramipril (ALTACE) 10 MG capsule TAKE 1 CAPSULE BY MOUTH TWICE A DAY 11/07/19  Yes [place], Modena Nunnery, MD  triamterene-hydrochlorothiazide [facility]) 37.5-25 MG tablet TAKE 1 TABLET BY MOUTH EVERY DAY 02/22/20  Yes [place], Modena Nunnery, MD    Allergies    Codeine and Ultram [tramadol]  Review of Systems   Review of Systems  Constitutional: Negative for chills and fever.  Gastrointestinal: Positive for abdominal pain and constipation. Negative for diarrhea, nausea and vomiting.  Genitourinary: Negative for dysuria and frequency.  Musculoskeletal: Negative for back pain.  Psychiatric/Behavioral: Negative for confusion.  All other systems reviewed and are negative.   Physical Exam Updated Vital Signs BP (!) 143/70   Pulse 64   Temp 98 F (36.7 C) (Oral)   Resp 16   Ht 5\' 3"  (1.6 m)   Wt 70.3 kg   LMP  (LMP Unknown)   SpO2 98%   BMI 27.46 kg/m   Physical Exam Vitals and nursing note reviewed.  Constitutional:      General:  She is not in acute distress.    Appearance: She is well-developed. She is not ill-appearing.  HENT:     Head: Normocephalic and atraumatic.  Eyes:     Conjunctiva/sclera: Conjunctivae normal.  Cardiovascular:     Rate and Rhythm: Normal rate and regular rhythm.  Pulmonary:     Effort: Pulmonary effort is normal. No respiratory distress.     Breath sounds: Normal breath sounds.  Abdominal:  General: Bowel sounds are normal. There is no distension.     Palpations: Abdomen is soft.     Tenderness: There is no abdominal tenderness. There is no guarding or rebound.  Skin:    General: Skin is warm.  Neurological:     Mental Status: She is alert.  Psychiatric:        Behavior: Behavior normal.     ED Results / Procedures / Treatments   Labs (all labs ordered are listed, but only abnormal results are displayed) Labs Reviewed  COMPREHENSIVE METABOLIC PANEL - Abnormal; Notable for the following components:      Result Value   Potassium 3.2 (*)    Glucose, Bld 105 (*)    All other components within normal limits  CBC - Abnormal; Notable for the following components:   RBC 3.38 (*)    Hemoglobin 11.8 (*)    HCT 35.1 (*)    MCV 103.8 (*)    MCH 34.9 (*)    All other components within normal limits  LIPASE, BLOOD  URINALYSIS, ROUTINE W REFLEX MICROSCOPIC    EKG None  Radiology CT Abdomen Pelvis W Contrast  Result Date: 04/08/2020 CLINICAL DATA:  Abdominal pain, rule out obstruction EXAM: CT ABDOMEN AND PELVIS WITH CONTRAST TECHNIQUE: Multidetector CT imaging of the abdomen and pelvis was performed using the standard protocol following bolus administration of intravenous contrast. CONTRAST:  32mL OMNIPAQUE IOHEXOL 300 MG/ML  SOLN COMPARISON:  11/17/2010 FINDINGS: Lower chest: No acute abnormality. Hepatobiliary: No solid liver abnormality is seen. No gallstones, gallbladder wall thickening, or biliary dilatation. Pancreas: Unremarkable. No pancreatic ductal dilatation or  surrounding inflammatory changes. Spleen: Normal in size without significant abnormality. Adrenals/Urinary Tract: Adrenal glands are unremarkable. Kidneys are normal, without renal calculi, solid lesion, or hydronephrosis. Bladder is unremarkable. Stomach/Bowel: Stomach is within normal limits. Appendix is not clearly visualized. No evidence of bowel wall thickening, distention, or inflammatory changes. Moderate burden of stool throughout the colon. Vascular/Lymphatic: Aortic atherosclerosis. No enlarged abdominal or pelvic lymph nodes. Reproductive: Status post hysterectomy.  Fluid in the vaginal cuff. Other: No abdominal wall hernia or abnormality. No abdominopelvic ascites. Musculoskeletal: No acute or significant osseous findings. IMPRESSION: 1. No acute CT findings of the abdomen or pelvis to explain abdominal pain. No evidence of bowel obstruction. 2. Moderate burden of stool throughout the colon. 3. Status post hysterectomy. Fluid in the vaginal cuff, abnormal although of uncertain significance. Correlate with physical exam. 4. Aortic Atherosclerosis (ICD10-I70.0). Electronically Signed   By: Eddie Candle M.D.   On: 04/08/2020 10:33    Procedures Procedures (including critical care time)  Medications Ordered in ED Medications  potassium chloride SA (KLOR-CON) CR tablet 40 mEq (has no administration in time range)  iohexol (OMNIPAQUE) 300 MG/ML solution 80 mL (80 mLs Intravenous Contrast Given 04/08/20 0949)    ED Course  I have reviewed the triage vital signs and the nursing notes.  Pertinent labs & imaging results that were available during my care of the patient were reviewed by me and considered in my medical decision making (see chart for details).    MDM Rules/Calculators/A&P                          Patient presenting with feeling of incomplete bowel movements over the last few weeks, intermittent generalized abdominal pain at night that is positional. Her family member at bedside  reports she is at her mental baseline. On exam, she is well-appearing and  in no distress. Abdomen is soft and nontender. No leukocytosis. CT scan with moderate stool burden, no bowel obstruction or other emergent intra-abdominal findings. Recommend stool softener, p.o. hydration, PCP follow-up.  Patient discussed with Dr. Roderic Palau.  Final Clinical Impression(s) / ED Diagnoses Final diagnoses:  Constipation, unspecified constipation type    Rx / DC Orders ED Discharge Orders    None       Avie Checo, [place] N, PA-C 04/08/20 1132    Milton Ferguson, MD 04/10/20 1028

## 2020-04-08 NOTE — ED Notes (Signed)
Pt in CT at this time.

## 2020-04-10 ENCOUNTER — Other Ambulatory Visit: Payer: Self-pay | Admitting: "Endocrinology

## 2020-05-01 ENCOUNTER — Other Ambulatory Visit: Payer: Self-pay | Admitting: Family Medicine

## 2020-05-04 ENCOUNTER — Other Ambulatory Visit: Payer: Self-pay | Admitting: "Endocrinology

## 2020-05-18 ENCOUNTER — Other Ambulatory Visit: Payer: Self-pay

## 2020-05-18 ENCOUNTER — Encounter: Payer: Self-pay | Admitting: Family Medicine

## 2020-05-18 ENCOUNTER — Ambulatory Visit (INDEPENDENT_AMBULATORY_CARE_PROVIDER_SITE_OTHER): Payer: Medicare PPO | Admitting: Family Medicine

## 2020-05-18 VITALS — BP 130/68 | HR 74 | Temp 98.3°F | Resp 14 | Ht 63.0 in | Wt 149.0 lb

## 2020-05-18 DIAGNOSIS — E210 Primary hyperparathyroidism: Secondary | ICD-10-CM

## 2020-05-18 DIAGNOSIS — Z23 Encounter for immunization: Secondary | ICD-10-CM

## 2020-05-18 DIAGNOSIS — I10 Essential (primary) hypertension: Secondary | ICD-10-CM

## 2020-05-18 DIAGNOSIS — R4189 Other symptoms and signs involving cognitive functions and awareness: Secondary | ICD-10-CM

## 2020-05-18 DIAGNOSIS — Z170 Estrogen receptor positive status [ER+]: Secondary | ICD-10-CM

## 2020-05-18 DIAGNOSIS — K5901 Slow transit constipation: Secondary | ICD-10-CM | POA: Diagnosis not present

## 2020-05-18 DIAGNOSIS — Z0001 Encounter for general adult medical examination with abnormal findings: Secondary | ICD-10-CM

## 2020-05-18 DIAGNOSIS — C50412 Malignant neoplasm of upper-outer quadrant of left female breast: Secondary | ICD-10-CM

## 2020-05-18 DIAGNOSIS — E7800 Pure hypercholesterolemia, unspecified: Secondary | ICD-10-CM

## 2020-05-18 DIAGNOSIS — E440 Moderate protein-calorie malnutrition: Secondary | ICD-10-CM

## 2020-05-18 DIAGNOSIS — Z8673 Personal history of transient ischemic attack (TIA), and cerebral infarction without residual deficits: Secondary | ICD-10-CM

## 2020-05-18 DIAGNOSIS — Z0000 Encounter for general adult medical examination without abnormal findings: Secondary | ICD-10-CM

## 2020-05-18 DIAGNOSIS — H9193 Unspecified hearing loss, bilateral: Secondary | ICD-10-CM

## 2020-05-18 MED ORDER — LACTULOSE 10 GM/15ML PO SOLN: 20.0000 g | Freq: Every day | ORAL | 2 refills | Status: DC | PRN

## 2020-05-18 NOTE — Progress Notes (Signed)
Subjective:   Patient presents for Medicare Annual/Subsequent preventive examination.    Only concern is constipation- seen at ER in august Per her daughter she has been taking many different OTC meds, plus cranberry and prune juice, she states nothing helps She has been on miralax, linzess, amitiza as well   Hyperparathyroidism- due for repeat labs, missed last visit with endocrine, it was supposed to be rescheduled   Breast cancer history- she is still on Femara  scheduled for oncology this winter     Meds reviewed   appetite is fair, often asks for food, but only takes a few bites or she will just graze food  She has ensure/boost, weight down  4lbs since her visit in Feb She has lost10lbs over the past year      Review Past Medical/Family/Social: per EMR    Risk Factors  Current exercise habits: minimal Dietary issues discussed: taking in protein, eating regulary   Cardiac risk factors: CVA, hyperlipidemia, HTN  Depression Screen  (Note: if answer to either of the following is "Yes", a more complete depression screening is indicated)  Over the past two weeks, have you felt down, depressed or hopeless? No Over the past two weeks, have you felt little interest or pleasure in doing things? No Have you lost interest or pleasure in daily life? No Do you often feel hopeless? No Do you cry easily over simple problems? No   Activities of Daily Living  In your present state of health, do you have any difficulty performing the following activities?:  Driving? No  Managing money? No  Feeding yourself? No  Getting from bed to chair? No  Climbing a flight of stairs? No  Preparing food and eating?: No  Bathing or showering? No  Getting dressed: No  Getting to the toilet? No  Using the toilet:No  Moving around from place to place: No  In the past year have you fallen or had a near fall?:No  Are you sexually active? No  Do you have more than one partner? No   Hearing  Difficulties:  Yes, but does not want to pay for more hearing aides   Do you often ask people to speak up or repeat themselves? Yes  Do you experience ringing or noises in your ears? No Do you have difficulty understanding soft or whispered voices? Yes  Do you feel that you have a problem with memory? Yes  Do you often misplace items? No  Do you feel safe at home? Yes  Cognitive Testing  Conginitive decline, daughter monitors medications  List the Names of Other Physician/Practitioners you currently use:   Oncology, Orthopedics, endocrinology    Screening Tests / Date Per  Colonoscopy       - exceeded max age               Zostavax - Declined Pneumonia- UTD Mammogram  UTD Influenza Vaccine  DUE COVID-19 UTD   ROS:  GEN- denies fatigue, fever, weight loss,weakness, recent illness HEENT- denies eye drainage, change in vision, nasal discharge, CVS- denies chest pain, palpitations RESP- denies SOB, cough, wheeze ABD- denies N/V, change in stools, abd pain GU- denies dysuria, hematuria, dribbling, incontinence MSK- denies joint pain, muscle aches, injury Neuro- denies headache, dizziness, syncope, seizure activity  PHYSICAL: GEN- NAD, alert and oriented x3,walking with cane  HEENT- PERRL, EOMI, non injected sclera, pink conjunctiva, MMM, oropharynx clear Neck- Supple, no thryomegaly , no bruit  CVS- RRR, no murmur RESP-CTAB ABD-NABS,soft,NT,ND EXT- No edema  Pulses- Radial, DP- 2+   Assessment:    Annual wellness medicare exam   Plan:    During the course of the visit the patient was educated and counseled about appropriate screening and preventive services including:  Flu shotWas given Shingles/TDAP-She declines these  HTN bp controlled  Hypothyroidism continue replacement check TFT  Hyperparathyroidism- check PTH, intact calcium  Constipation- trial of lactulose, stop other OTC meds, can continue  Prune juice or fiber supplements  Protein calorie  malnutrition- continue boost, encourage regular meals at her age this is very common   History of CVA/ coginitive decline , on statin drug   Discussed advanced directives -Handouts given, states she has POA at home, but still wont give paperwork to anyone      Diet review for nutrition referral? Yes ____ Not Indicated __x__  Patient Instructions (the written plan) was given to the patient.  Medicare Attestation  I have personally reviewed:  The patient's medical and social history  Their use of alcohol, tobacco or illicit drugs  Their current medications and supplements  The patient's functional ability including ADLs,fall risks, home safety risks, cognitive, and hearing and visual impairment  Diet and physical activities  Evidence for depression or mood disorders  The patient's weight, height, BMI, and visual acuity have been recorded in the chart. I have made referrals, counseling, and provided education to the patient based on review of the above and I have provided the patient with a written personalized care plan for preventive services.

## 2020-05-18 NOTE — Patient Instructions (Addendum)
Take maxide in the morning  Take lactulose for constipation Flu shot given  Schedule eye exam  F/U 6 months

## 2020-05-20 ENCOUNTER — Encounter: Payer: Self-pay | Admitting: Family Medicine

## 2020-05-21 LAB — CBC WITH DIFFERENTIAL/PLATELET
Absolute Monocytes: 435 cells/uL (ref 200–950)
Basophils Absolute: 22 cells/uL (ref 0–200)
Basophils Relative: 0.4 %
Eosinophils Absolute: 11 cells/uL — ABNORMAL LOW (ref 15–500)
Eosinophils Relative: 0.2 %
HCT: 35.7 % (ref 35.0–45.0)
Hemoglobin: 12.2 g/dL (ref 11.7–15.5)
Lymphs Abs: 902 cells/uL (ref 850–3900)
MCH: 35.2 pg — ABNORMAL HIGH (ref 27.0–33.0)
MCHC: 34.2 g/dL (ref 32.0–36.0)
MCV: 102.9 fL — ABNORMAL HIGH (ref 80.0–100.0)
MPV: 9.7 fL (ref 7.5–12.5)
Monocytes Relative: 7.9 %
Neutro Abs: 4131 cells/uL (ref 1500–7800)
Neutrophils Relative %: 75.1 %
Platelets: 207 10*3/uL (ref 140–400)
RBC: 3.47 10*6/uL — ABNORMAL LOW (ref 3.80–5.10)
RDW: 12.5 % (ref 11.0–15.0)
Total Lymphocyte: 16.4 %
WBC: 5.5 10*3/uL (ref 3.8–10.8)

## 2020-05-21 LAB — COMPREHENSIVE METABOLIC PANEL
AG Ratio: 1.7 (calc) (ref 1.0–2.5)
ALT: 7 U/L (ref 6–29)
AST: 17 U/L (ref 10–35)
Albumin: 4.1 g/dL (ref 3.6–5.1)
Alkaline phosphatase (APISO): 67 U/L (ref 37–153)
BUN: 12 mg/dL (ref 7–25)
CO2: 25 mmol/L (ref 20–32)
Calcium: 9.7 mg/dL (ref 8.6–10.4)
Chloride: 101 mmol/L (ref 98–110)
Creat: 0.87 mg/dL (ref 0.60–0.88)
Globulin: 2.4 g/dL (calc) (ref 1.9–3.7)
Glucose, Bld: 108 mg/dL — ABNORMAL HIGH (ref 65–99)
Potassium: 3.5 mmol/L (ref 3.5–5.3)
Sodium: 138 mmol/L (ref 135–146)
Total Bilirubin: 0.5 mg/dL (ref 0.2–1.2)
Total Protein: 6.5 g/dL (ref 6.1–8.1)

## 2020-05-21 LAB — PTH, INTACT AND CALCIUM
Calcium: 9.7 mg/dL (ref 8.6–10.4)
PTH: 67 pg/mL — ABNORMAL HIGH (ref 14–64)

## 2020-05-21 LAB — LIPID PANEL
Cholesterol: 158 mg/dL (ref <200)
HDL: 68 mg/dL (ref >=50)
LDL Cholesterol (Calc): 72 mg/dL (calc)
Non-HDL Cholesterol (Calc): 90 mg/dL (calc) (ref <130)
Total CHOL/HDL Ratio: 2.3 (calc) (ref <5.0)
Triglycerides: 97 mg/dL (ref <150)

## 2020-06-02 ENCOUNTER — Other Ambulatory Visit: Payer: Self-pay | Admitting: Family Medicine

## 2020-06-14 DIAGNOSIS — H04123 Dry eye syndrome of bilateral lacrimal glands: Secondary | ICD-10-CM | POA: Diagnosis not present

## 2020-07-29 ENCOUNTER — Other Ambulatory Visit: Payer: Self-pay | Admitting: "Endocrinology

## 2020-07-30 ENCOUNTER — Other Ambulatory Visit: Payer: Self-pay | Admitting: Family Medicine

## 2020-08-02 ENCOUNTER — Ambulatory Visit: Payer: Medicare PPO | Attending: Internal Medicine

## 2020-08-02 DIAGNOSIS — Z23 Encounter for immunization: Secondary | ICD-10-CM

## 2020-08-02 NOTE — Progress Notes (Signed)
   Covid-19 Vaccination Clinic  Name:  Dawn Church    MRN: [id number] DOB: [birth year]  08/02/2020  [name] was observed post Covid-19 immunization for 15 minutes without incident. She was provided with Vaccine Information Sheet and instruction to access the V-Safe system.   [name] was instructed to call 911 with any severe reactions post vaccine: Marland Kitchen Difficulty breathing  . Swelling of face and throat  . A fast heartbeat  . A bad rash all over body  . Dizziness and weakness   Immunizations Administered    No immunizations on file.

## 2020-08-03 ENCOUNTER — Ambulatory Visit (INDEPENDENT_AMBULATORY_CARE_PROVIDER_SITE_OTHER): Payer: Medicare PPO | Admitting: Family Medicine

## 2020-08-03 ENCOUNTER — Encounter: Payer: Self-pay | Admitting: Family Medicine

## 2020-08-03 ENCOUNTER — Other Ambulatory Visit: Payer: Self-pay

## 2020-08-03 VITALS — BP 142/82 | HR 90 | Temp 97.8°F | Resp 16 | Ht 63.0 in | Wt 144.0 lb

## 2020-08-03 DIAGNOSIS — R634 Abnormal weight loss: Secondary | ICD-10-CM | POA: Diagnosis not present

## 2020-08-03 DIAGNOSIS — R4189 Other symptoms and signs involving cognitive functions and awareness: Secondary | ICD-10-CM | POA: Diagnosis not present

## 2020-08-03 DIAGNOSIS — K5901 Slow transit constipation: Secondary | ICD-10-CM | POA: Diagnosis not present

## 2020-08-03 DIAGNOSIS — M5136 Other intervertebral disc degeneration, lumbar region: Secondary | ICD-10-CM | POA: Diagnosis not present

## 2020-08-03 DIAGNOSIS — E440 Moderate protein-calorie malnutrition: Secondary | ICD-10-CM

## 2020-08-03 DIAGNOSIS — M51369 Other intervertebral disc degeneration, lumbar region without mention of lumbar back pain or lower extremity pain: Secondary | ICD-10-CM

## 2020-08-03 MED ORDER — MIRTAZAPINE 7.5 MG PO TABS
7.5000 mg | ORAL_TABLET | Freq: Every day | ORAL | 1 refills | Status: DC
Start: 2020-08-03 — End: 2020-08-27

## 2020-08-03 MED ORDER — LACTULOSE 10 GM/15ML PO SOLN: 20.0000 g | Freq: Two times a day (BID) | ORAL | 2 refills | Status: DC | PRN

## 2020-08-03 MED ORDER — HYDROCODONE-ACETAMINOPHEN 5-325 MG PO TABS: ORAL_TABLET | ORAL | 0 refills | Status: DC

## 2020-08-03 NOTE — Patient Instructions (Addendum)
Take lactulose twice a day  If bowels dont move use dulcalax suppository  Hydrocodone for pain , give only 1/2 tablet,  Maxide ( Triamterene HCTZ should be taken in the morning)  remeron at bedtime for appetite F/U 4 weeks

## 2020-08-03 NOTE — Progress Notes (Signed)
   Subjective:    Patient ID: Dawn Church, female    DOB: [birth year], 86 y.o.   MRN: [id number]  Patient presents for Bowel Issues (lower abd pain with loose stools that are dark)   Pt here with worsening constipation     Drinking boost in coffee   Not eating as well, weight down another 5lbs since visit in sept   she often is very picky or just doesn't want to eat   she uses lactoluse some days per report, very difficult as she was fussing with her daughter about what she does and doesn't do She had some lower abd pain, but that is present now  no UTI symptoms   She has chronic back pain with right leg pain, taking tylenol morning and evening. No further orthopedic intervention. NO recent falls    Review Of Systems:  GEN- denies fatigue, fever, weight loss,weakness, recent illness HEENT- denies eye drainage, change in vision, nasal discharge, CVS- denies chest pain, palpitations RESP- denies SOB, cough, wheeze ABD- denies N/V, change in stools, abd pain GU- denies dysuria, hematuria, dribbling, incontinence MSK- denies joint pain, muscle aches, injury Neuro- denies headache, dizziness, syncope, seizure activity       Objective:    BP (!) 142/82   Pulse 90   Temp 97.8 F (36.6 C) (Temporal)   Resp 16   Ht 5\' 3"  (1.6 m)   Wt 144 lb (65.3 kg)   LMP  (LMP Unknown)   SpO2 97%   BMI 25.51 kg/m  GEN- NAD, alert and oriented x3,walks with cane , elderly female  HEENT- PERRL, EOMI, non injected sclera, pink conjunctiva, MMM, oropharynx clear CVS- RRR, no murmur RESP-CTAB ABD-NABS,soft,NT,ND, No CVA tederness MSK Decreased ROM SPINE, LOWER EXT, spine NT EXT- No edema Pulses- Radial  2+        Assessment & Plan:      Problem List Items Addressed This Visit      Unprioritized   Cognitive decline    She has some decline Age related and likely some underlying vascular changes too with her history Very difficult for her daughters at home as she fusses a lot with  them and often doesn't want to eat what they cook But they want to keep her at home Will add remeron 7.5mg  at bedtime for mood/ appetite       Constipation    Increase lactulose to twice a day  Next step is suppository      DDD (degenerative disc disease), lumbar - Primary    Chronic pain, DDD, OA Given norco, can use 1/2 tablet for severe pain,  Continue tylenol otherwise Daughter dispenses meds She did not tolerate codiene or ultram       Relevant Medications   HYDROcodone-acetaminophen (NORCO) 5-325 MG tablet   Protein-calorie malnutrition ([place])   Weight loss      Note: This dictation was prepared with Dragon dictation along with smaller phrase technology. Any transcriptional errors that result from this process are unintentional.

## 2020-08-05 DIAGNOSIS — E46 Unspecified protein-calorie malnutrition: Secondary | ICD-10-CM | POA: Insufficient documentation

## 2020-08-05 NOTE — Assessment & Plan Note (Signed)
Chronic pain, DDD, OA Given norco, can use 1/2 tablet for severe pain,  Continue tylenol otherwise Daughter dispenses meds She did not tolerate codiene or ultram

## 2020-08-05 NOTE — Assessment & Plan Note (Signed)
She has some decline Age related and likely some underlying vascular changes too with her history Very difficult for her daughters at home as she fusses a lot with them and often doesn't want to eat what they cook But they want to keep her at home Will add remeron 7.5mg  at bedtime for mood/ appetite

## 2020-08-05 NOTE — Assessment & Plan Note (Signed)
Increase lactulose to twice a day  Next step is suppository

## 2020-08-15 ENCOUNTER — Other Ambulatory Visit: Payer: Self-pay | Admitting: Family Medicine

## 2020-08-16 NOTE — Assessment & Plan Note (Signed)
Left Lumpectomy 08/19/16: IDC with DCIS 1.3 cm, DCIS close to 0.1 cm to all margins;ER 100%, PR 100%, Ki-67 5%, HER-2 negative ratio 1.05, T1cNx (Stage 1A) Adj XRT completed 10/20/16  Current treatment: Letrozole 2.5 mg daily started05/11/2016 Bone density 12/19/2016: Normal T score 0.4  Letrozole toxicities: Denies any hot flashes or myalgias. Slight discomfort in the right hip intermittently.  Breast cancer surveillance: Breast exam 08/17/2020: Benign Bone density 12/18/2016 T score 0.4: Normal  Mammograms she has not had a mammogram in the last couple of years but she would like to get one.  I ordered 1 to be done at [facility] in March 2021.  After that she can have mammograms on an as-needed basis.  She will turn 89 next year.  I renewed her prescription for letrozole.  Hypertension: She will follow with her primary care physician.  Return to clinic in 1 year for follow-up.

## 2020-08-16 NOTE — Progress Notes (Signed)
Patient Care Team: Jefferson, Modena Nunnery, MD as PCP - General (Family Medicine) Delice Bison, Charlestine Massed, NP as Nurse Practitioner (Hematology and Oncology) Excell Seltzer, MD (Inactive) as Consulting Physician (General Surgery) Nicholas Lose, MD as Consulting Physician (Hematology and Oncology) Kyung Rudd, MD as Consulting Physician (Radiation Oncology)  DIAGNOSIS:    ICD-10-CM   1. Malignant neoplasm of upper-outer quadrant of left breast in female, estrogen receptor positive ([place])  C50.412    Z17.0     SUMMARY OF ONCOLOGIC HISTORY: Oncology History  Breast cancer of upper-outer quadrant of left female breast ([place])  06/10/2016 Initial Diagnosis   Left breast biopsy 2:00 position: Grade 2 IDC with DCIS, ER 100%, PR 100%, Ki-67 5%, HER-2 negative ratio 1.05, 1.2 cm irregular mass left breast 2:00 position 8 cm from nipple, T1c N0 stage IA clinical stage   08/19/2016 Surgery   Left Lumpectomy: IDC with DCIS 1.3 cm, DCIS close to 0.1 cm to all margins;ER 100%, PR 100%, Ki-67 5%, HER-2 negative ratio 1.05, T1cNx (Stage 1A)   09/22/2016 - 10/20/2016 Radiation Therapy   Radiation [name]): 1)Left breast/ 42.5 Gy in 17 fractions.  2) Left breast boost/ 10 Gy in 4 fractions   12/2016 -  Anti-estrogen oral therapy   Letrozole 2.5 mg daily     CHIEF COMPLIANT: Follow-up of left breast cancer on letrozole therapy  INTERVAL HISTORY: Dawn Church is a 86 y.o. with above-mentioned history of left breast cancer treated with lumpectomy, radiation, and who is currently on antiestrogen therapy with letrozole. She presents to the clinic today for annual follow-up.    ALLERGIES:  is allergic to codeine and ultram [tramadol].  MEDICATIONS:  Current Outpatient Medications  Medication Sig Dispense Refill  . Acetaminophen (APAP ARTHRITIS PO) Take 2 tablets by mouth as needed.    Marland Kitchen amLODipine (NORVASC) 5 MG tablet TAKE 1 TABLET BY MOUTH EVERY DAY IN THE MORNING 90 tablet 2  . aspirin EC 81 MG  tablet Take 2 tablets (162 mg total) by mouth daily.    Marland Kitchen atorvastatin (LIPITOR) 10 MG tablet TAKE 1 TABLET BY MOUTH EVERYDAY AT BEDTIME 90 tablet 2  . cinacalcet (SENSIPAR) 30 MG tablet TAKE 1 TABLET (30 MG TOTAL) BY MOUTH DAILY WITH BREAKFAST. 90 tablet 0  . diclofenac sodium (VOLTAREN) 1 % GEL Apply to affected areas tree times a day 1 Tube 2  . fluticasone (FLONASE) 50 MCG/ACT nasal spray SPRAY 2 SPRAYS INTO EACH NOSTRIL EVERY DAY 48 mL 3  . HYDROcodone-acetaminophen (NORCO) 5-325 MG tablet Take 1/2 tablet every BID prn 30 tablet 0  . lactulose (CHRONULAC) 10 GM/15ML solution Take 30 mLs (20 g total) by mouth 2 (two) times daily as needed for mild constipation. 950 mL 2  . letrozole (FEMARA) 2.5 MG tablet Take 1 tablet (2.5 mg total) by mouth daily. 90 tablet 3  . mirtazapine (REMERON) 7.5 MG tablet Take 1 tablet (7.5 mg total) by mouth at bedtime. 30 tablet 1  . Multiple Vitamin (MULTIVITAMIN) tablet Take 1 tablet by mouth daily.      Marland Kitchen nystatin (MYCOSTATIN/NYSTOP) powder APPLY TO AFFECTED AREA 4 TIMES A DAY 60 g 2  . omega-3 acid ethyl esters (LOVAZA) 1 g capsule TAKE 1 CAPSULE BY MOUTH TWICE A DAY 180 capsule 1  . pantoprazole (PROTONIX) 40 MG tablet TAKE 1 TABLET BY MOUTH EVERY DAY BEFORE BREAKFAST 90 tablet 2  . ramipril (ALTACE) 10 MG capsule TAKE 1 CAPSULE BY MOUTH TWICE A DAY 180 capsule 2  .  senna (SENOKOT) 176 MG/5ML SYRP Take by mouth.    . triamterene-hydrochlorothiazide (MAXZIDE-25) 37.5-25 MG tablet TAKE 1 TABLET BY MOUTH EVERY DAY 90 tablet 1   No current facility-administered medications for this visit.    PHYSICAL EXAMINATION: ECOG PERFORMANCE STATUS: 1 - Symptomatic but completely ambulatory  Vitals:   08/17/20 1135  BP: (!) 159/70  Pulse: 80  Resp: 18  Temp: 97.7 F (36.5 C)  SpO2: 98%   Filed Weights   08/17/20 1135  Weight: 145 lb 11.2 oz (66.1 kg)    BREAST: No palpable masses or nodules in either right or left breasts. No palpable axillary  supraclavicular or infraclavicular adenopathy no breast tenderness or nipple discharge. (exam performed in the presence of a chaperone)  LABORATORY DATA:  I have reviewed the data as listed CMP Latest Ref Rng & Units 05/18/2020 05/18/2020 04/08/2020  Glucose 65 - 99 mg/dL - 108(H) 105(H)  BUN 7 - 25 mg/dL - 12 12  Creatinine 0.60 - 0.88 mg/dL - 0.87 0.74  Sodium 135 - 146 mmol/L - 138 138  Potassium 3.5 - 5.3 mmol/L - 3.5 3.2(L)  Chloride 98 - 110 mmol/L - 101 100  CO2 20 - 32 mmol/L - 25 27  Calcium 8.6 - 10.4 mg/dL 9.7 9.7 9.4  Total Protein 6.1 - 8.1 g/dL - 6.5 7.0  Total Bilirubin 0.2 - 1.2 mg/dL - 0.5 0.6  Alkaline Phos 38 - 126 U/L - - 62  AST 10 - 35 U/L - 17 22  ALT 6 - 29 U/L - 7 11    Lab Results  Component Value Date   WBC 5.5 05/18/2020   HGB 12.2 05/18/2020   HCT 35.7 05/18/2020   MCV 102.9 (H) 05/18/2020   PLT 207 05/18/2020   NEUTROABS 4,131 05/18/2020    ASSESSMENT & PLAN:  Breast cancer of upper-outer quadrant of left female breast (HCC) Left Lumpectomy 08/19/16: IDC with DCIS 1.3 cm, DCIS close to 0.1 cm to all margins;ER 100%, PR 100%, Ki-67 5%, HER-2 negative ratio 1.05, T1cNx (Stage 1A) Adj XRT completed 10/20/16  Current treatment: Letrozole 2.5 mg daily started05/11/2016 Bone density 12/19/2016: Normal T score 0.4  Letrozole toxicities: Denies any hot flashes or myalgias. Slight discomfort in the right hip intermittently.  Breast cancer surveillance: Breast exam 08/17/2020: Benign Bone density 12/18/2016 T score 0.4: Normal    I renewed her prescription for letrozole.    Return to clinic in 1 year for follow-up.     No orders of the defined types were placed in this encounter.  The patient has a good understanding of the overall plan. she agrees with it. she will call with any problems that may develop before the next visit here.  Total time spent: 20 mins including face to face time and time spent for planning, charting and  coordination of care  , , MD 08/17/2020  I, Molly Dorshimer, am acting as scribe for Dr.  .  I have reviewed the above documentation for accuracy and completeness, and I agree with the above.       

## 2020-08-17 ENCOUNTER — Other Ambulatory Visit: Payer: Self-pay

## 2020-08-17 ENCOUNTER — Telehealth: Payer: Self-pay | Admitting: Hematology and Oncology

## 2020-08-17 ENCOUNTER — Inpatient Hospital Stay: Payer: Medicare PPO | Attending: Hematology and Oncology | Admitting: Hematology and Oncology

## 2020-08-17 DIAGNOSIS — Z885 Allergy status to narcotic agent status: Secondary | ICD-10-CM | POA: Diagnosis not present

## 2020-08-17 DIAGNOSIS — Z888 Allergy status to other drugs, medicaments and biological substances status: Secondary | ICD-10-CM | POA: Diagnosis not present

## 2020-08-17 DIAGNOSIS — C50412 Malignant neoplasm of upper-outer quadrant of left female breast: Secondary | ICD-10-CM | POA: Insufficient documentation

## 2020-08-17 DIAGNOSIS — Z79899 Other long term (current) drug therapy: Secondary | ICD-10-CM | POA: Diagnosis not present

## 2020-08-17 DIAGNOSIS — Z79811 Long term (current) use of aromatase inhibitors: Secondary | ICD-10-CM | POA: Diagnosis not present

## 2020-08-17 DIAGNOSIS — Z170 Estrogen receptor positive status [ER+]: Secondary | ICD-10-CM | POA: Insufficient documentation

## 2020-08-17 MED ORDER — LETROZOLE 2.5 MG PO TABS: 2.5000 mg | ORAL_TABLET | Freq: Every day | ORAL | 3 refills | Status: DC

## 2020-08-17 NOTE — Telephone Encounter (Signed)
Scheduled appts per 12/17 los. Gave pt a print out of AVS.

## 2020-08-26 ENCOUNTER — Other Ambulatory Visit: Payer: Self-pay | Admitting: Family Medicine

## 2020-08-27 ENCOUNTER — Ambulatory Visit (INDEPENDENT_AMBULATORY_CARE_PROVIDER_SITE_OTHER): Payer: Medicare PPO | Admitting: Family Medicine

## 2020-08-27 ENCOUNTER — Encounter: Payer: Self-pay | Admitting: Family Medicine

## 2020-08-27 VITALS — BP 143/79 | HR 88 | Temp 97.7°F | Wt 145.0 lb

## 2020-08-27 DIAGNOSIS — M5136 Other intervertebral disc degeneration, lumbar region: Secondary | ICD-10-CM | POA: Diagnosis not present

## 2020-08-27 DIAGNOSIS — K5901 Slow transit constipation: Secondary | ICD-10-CM | POA: Diagnosis not present

## 2020-08-27 DIAGNOSIS — E440 Moderate protein-calorie malnutrition: Secondary | ICD-10-CM | POA: Diagnosis not present

## 2020-08-27 DIAGNOSIS — I10 Essential (primary) hypertension: Secondary | ICD-10-CM

## 2020-08-27 MED ORDER — MIRTAZAPINE 7.5 MG PO TABS: 7.5000 mg | ORAL_TABLET | Freq: Every day | ORAL | 3 refills | Status: DC

## 2020-08-27 MED ORDER — HYDROCODONE-ACETAMINOPHEN 5-325 MG PO TABS: ORAL_TABLET | ORAL | 0 refills | Status: DC

## 2020-08-27 NOTE — Assessment & Plan Note (Signed)
Improved low dose norco prn

## 2020-08-27 NOTE — Patient Instructions (Signed)
F/U as previous 

## 2020-08-27 NOTE — Assessment & Plan Note (Signed)
Improved with lactulose BID

## 2020-08-27 NOTE — Assessment & Plan Note (Signed)
Bp at baseline no changes

## 2020-08-27 NOTE — Progress Notes (Signed)
   Subjective:    Patient ID: Dawn Church, female    DOB: 06/02/1928, 86 y.o.   MRN: [id number]  Patient presents for Weight Loss   Pt here to f/u medications  at last visit started on remeron , her weight is up 1lb She is sleeping a little better than before with the new medication No SE with the meds   HTN she is taking the maxzide in the morning/norvasc in the morning   Chronic pain-    Her daughter stays with her    constipation bowels have improved, using lactulose twice a day -daughter is dispensing No new concerns  neds meds refilled   Review Of Systems: per pt and daughter  GEN- denies fatigue, fever, weight loss,weakness, recent illness HEENT- denies eye drainage, change in vision, nasal discharge, CVS- denies chest pain, palpitations RESP- denies SOB, cough, wheeze ABD- denies N/V, change in stools, abd pain GU- denies dysuria, hematuria, dribbling, incontinence MSK-+ joint pain, muscle aches, injury Neuro- denies headache, dizziness, syncope, seizure activity       Objective:    BP (!) 143/79   Pulse 88   Temp 97.7 F (36.5 C) (Skin)   Wt 145 lb (65.8 kg)   LMP  (LMP Unknown)   SpO2 96%   BMI 25.69 kg/m  GEN- NAD, alert and oriented x3, walks with rollator, examined in chair  HEENT- PERRL, EOMI, non injected sclera, pink conjunctiva, MMM, oropharynx clear CVS- RRR, no murmur RESP-CTAB ABD-NABS,soft,NT,ND EXT- No edema Pulses- Radial 2+        Assessment & Plan:      Problem List Items Addressed This Visit      Unprioritized   Constipation    Improved with lactulose BID       DDD (degenerative disc disease), lumbar    Improved low dose norco prn       Relevant Medications   HYDROcodone-acetaminophen (NORCO) 5-325 MG tablet   Essential hypertension, benign - Primary    Bp at baseline no changes       Protein-calorie malnutrition (HCC)    Maintaining weight Labs reviewed No change to remeron Sleep also improved           Note: This dictation was prepared with Dragon dictation along with smaller Lobbyist. Any transcriptional errors that result from this process are unintentional.

## 2020-08-27 NOTE — Assessment & Plan Note (Signed)
Maintaining weight Labs reviewed No change to remeron Sleep also improved

## 2020-10-01 ENCOUNTER — Other Ambulatory Visit: Payer: Self-pay | Admitting: Family Medicine

## 2020-10-01 ENCOUNTER — Other Ambulatory Visit: Payer: Self-pay | Admitting: "Endocrinology

## 2020-10-08 ENCOUNTER — Other Ambulatory Visit: Payer: Self-pay | Admitting: *Deleted

## 2020-10-08 MED ORDER — HYDROCODONE-ACETAMINOPHEN 5-325 MG PO TABS: ORAL_TABLET | ORAL | 0 refills | Status: DC

## 2020-10-08 NOTE — Telephone Encounter (Signed)
Received call from patient daughter [name].   Ok to refill??  Last office visit/ refill 08/27/2020.

## 2020-10-10 ENCOUNTER — Other Ambulatory Visit: Payer: Self-pay | Admitting: Family Medicine

## 2020-10-31 ENCOUNTER — Telehealth: Payer: Self-pay

## 2020-10-31 NOTE — Telephone Encounter (Signed)
Left message with Bethena Roys Odds (daughter of Ms Schremp) we can not accept pt per Dr Posey Pronto.  We do not manage pain meds.

## 2020-11-16 ENCOUNTER — Ambulatory Visit: Payer: Medicare PPO | Admitting: Family Medicine

## 2020-11-26 DIAGNOSIS — M1991 Primary osteoarthritis, unspecified site: Secondary | ICD-10-CM | POA: Diagnosis not present

## 2020-11-26 DIAGNOSIS — I639 Cerebral infarction, unspecified: Secondary | ICD-10-CM | POA: Diagnosis not present

## 2020-11-26 DIAGNOSIS — K219 Gastro-esophageal reflux disease without esophagitis: Secondary | ICD-10-CM | POA: Diagnosis not present

## 2020-11-26 DIAGNOSIS — E785 Hyperlipidemia, unspecified: Secondary | ICD-10-CM | POA: Diagnosis not present

## 2020-11-26 DIAGNOSIS — Z853 Personal history of malignant neoplasm of breast: Secondary | ICD-10-CM | POA: Diagnosis not present

## 2020-11-26 DIAGNOSIS — Z0189 Encounter for other specified special examinations: Secondary | ICD-10-CM | POA: Diagnosis not present

## 2020-11-26 DIAGNOSIS — I10 Essential (primary) hypertension: Secondary | ICD-10-CM | POA: Diagnosis not present

## 2020-11-26 DIAGNOSIS — F0391 Unspecified dementia with behavioral disturbance: Secondary | ICD-10-CM | POA: Diagnosis not present

## 2021-01-15 ENCOUNTER — Other Ambulatory Visit (HOSPITAL_COMMUNITY): Payer: Self-pay | Admitting: Specialist

## 2021-01-15 DIAGNOSIS — T17308S Unspecified foreign body in larynx causing other injury, sequela: Secondary | ICD-10-CM

## 2021-01-16 ENCOUNTER — Encounter (HOSPITAL_COMMUNITY): Payer: Medicare PPO | Admitting: Speech Pathology

## 2021-01-18 ENCOUNTER — Other Ambulatory Visit (HOSPITAL_COMMUNITY): Payer: Self-pay | Admitting: Specialist

## 2021-01-18 DIAGNOSIS — R0989 Other specified symptoms and signs involving the circulatory and respiratory systems: Secondary | ICD-10-CM

## 2021-01-22 ENCOUNTER — Ambulatory Visit (HOSPITAL_COMMUNITY)
Admission: RE | Admit: 2021-01-22 | Discharge: 2021-01-22 | Disposition: A | Discharge status: Home / Self Care | Payer: Medicare PPO | Source: Ambulatory Visit | Attending: Family Medicine | Admitting: Family Medicine

## 2021-01-22 ENCOUNTER — Other Ambulatory Visit: Payer: Self-pay

## 2021-01-22 ENCOUNTER — Encounter (HOSPITAL_COMMUNITY): Payer: Self-pay | Admitting: Speech Pathology

## 2021-01-22 ENCOUNTER — Ambulatory Visit (HOSPITAL_COMMUNITY): Payer: Medicare PPO | Attending: Family Medicine | Admitting: Speech Pathology

## 2021-01-22 DIAGNOSIS — R0989 Other specified symptoms and signs involving the circulatory and respiratory systems: Secondary | ICD-10-CM | POA: Diagnosis present

## 2021-01-22 DIAGNOSIS — R1312 Dysphagia, oropharyngeal phase: Secondary | ICD-10-CM | POA: Insufficient documentation

## 2021-01-22 NOTE — Therapy (Signed)
[facility] [place], [place], [address] Phone: [phone]   Fax:  [phone]  Modified Barium Swallow  Patient Details  Name: Dawn Church MRN: [id number] Date of Birth: August 03, 1928 No data recorded  Encounter Date: 01/22/2021   End of Session - 01/22/21 1735    Visit Number 1    Number of Visits 1    Authorization Type [employer]    SLP Start Time 2952    SLP Stop Time  1411    SLP Time Calculation (min) 31 min    Activity Tolerance Patient tolerated treatment well           Past Medical History:  Diagnosis Date  . Anemia   . Ankle swelling   . Cancer ([place]) 06/22/2016   left breast DCIS  . CVA (cerebral infarction)   . Epigastric pain   . GERD (gastroesophageal reflux disease)   . Hemochromatosis   . Hx of seasonal allergies   . Hypercholesteremia   . Hypertension   . Prolonged QT interval   . Stomach ulcer   . Stroke ([place]) 2015  . TIA (transient ischemic attack)     Past Surgical History:  Procedure Laterality Date  . APPENDECTOMY    . BREAST BIOPSY     Bilateral  . BREAST BIOPSY     bilateral  . BREAST LUMPECTOMY WITH RADIOACTIVE SEED LOCALIZATION Left 08/19/2016   Procedure: LEFT BREAST LUMPECTOMY WITH RADIOACTIVE SEED LOCALIZATION;  Surgeon: Excell Seltzer, MD;  Location: [facility];  Service: General;  Laterality: Left;  . CATARACT EXTRACTION, BILATERAL    . ESOPHAGOGASTRODUODENOSCOPY  12/20/2010  . SHOULDER OPEN ROTATOR CUFF REPAIR     right   . VAGINAL HYSTERECTOMY      There were no vitals filed for this visit.   Subjective Assessment - 01/22/21 1719    Subjective "I have trouble with solids."    Special Tests MBSS    Currently in Pain? No/denies               General - 01/22/21 1721      General Information   Date of Onset 01/18/21    HPI Dawn Church is a 87 yo female who was referred by Eloy End, FNP for MBSS due to report of difficulty  swallowing solid foods.    Type of Study MBS-Modified Barium Swallow Study    Previous Swallow Assessment N/A    Diet Prior to this Study Regular;Thin liquids    Temperature Spikes Noted No    Respiratory Status Room air    History of Recent Intubation No    Behavior/Cognition Alert;Cooperative;Pleasant mood    Oral Cavity Assessment Within Functional Limits    Oral Care Completed by SLP No    Oral Cavity - Dentition Dentures, top    Vision Functional for self feeding    Self-Feeding Abilities Able to feed self    Patient Positioning Upright in chair    Baseline Vocal Quality Normal    Volitional Cough Strong    Volitional Swallow Able to elicit    Anatomy Within functional limits    Pharyngeal Secretions Not observed secondary MBS              Oral Preparation/Oral Phase - 01/22/21 1723      Oral Preparation/Oral Phase   Oral Phase Impaired      Oral - Thin   Oral - Thin Teaspoon Oral residue    Oral -  Thin Cup Other (Comment);Weak ligual manipulation;Oral residue   premature spillage   Oral - Thin Straw Oral residue      Oral - Solids   Oral - Puree Weak ligual manipulation;Decreased bolus cohesion;Delayed A-P transit;Oral residue    Oral - Regular Weak ligual manipulation;Imparied mastication;Delayed A-P transit;Decreased bolus cohesion;Oral residue;Piecemeal swallowing    Oral - Pill Within functional limits      Electrical stimulation - Oral Phase   Was Electrical Stimulation Used No            Pharyngeal Phase - 01/22/21 1731      Pharyngeal Phase   Pharyngeal Phase Impaired      Pharyngeal - Thin   Pharyngeal- Thin Teaspoon Swallow initiation at pyriform sinus;Swallow initiation at vallecula    Pharyngeal- Thin Cup Swallow initiation at pyriform sinus;Pharyngeal residue - valleculae;Pharyngeal residue - pyriform;Lateral channel residue;Reduced tongue base retraction    Pharyngeal- Thin Straw Swallow initiation at pyriform sinus;Pharyngeal residue -  valleculae;Pharyngeal residue - pyriform      Pharyngeal - Solids   Pharyngeal- Puree Within functional limits    Pharyngeal- Regular Within functional limits    Pharyngeal- Pill Within functional limits      Electrical Stimulation - Pharyngeal Phase   Was Electrical Stimulation Used No            Cricopharyngeal Phase - 01/22/21 1733      Cervical Esophageal Phase   Cervical Esophageal Phase Within functional limits             Plan - 01/22/21 1736    Clinical Impression Statement Pt presents with moderate oral phase dysphagia and min to normal pharyngeal phase dysphagia characterized by reduced lingual movement and impaired mastication resulting in reduced bolus cohesiveness, reduced anterior posterior transit, prolonged oral transit with oral residuals. Pt with premature spillage and min delay in swallow initiation with liquids, however no penetration, aspiration, or significant pharyngeal residuals (pt with repeat/dry spontaneous swallow to clear min pyriform residuals. Esophageal sweep revealed delayed emptying of barium tablet, but did clear with straw sips of thin barium. Recommend soft solids and encourage mastication and lingual movement and thin liquids via cup/straw, po medications whole in puree or with water. Above discussed with Pt and her daughter. If Pt is willing to try some dysphagia therapy, would focus on lingual strengthening.           Patient will benefit from skilled therapeutic intervention in order to improve the following deficits and impairments:   Dysphagia, oropharyngeal phase     Recommendations/Treatment - 01/22/21 1733      Swallow Evaluation Recommendations   SLP Diet Recommendations Thin;Dysphagia 3 (mechanical soft)    Liquid Administration via Cup;Straw    Medication Administration Whole meds with puree    Supervision Patient able to self feed    Compensations Small sips/bites;Slow rate;Follow solids with liquid;Multiple dry swallows  after each bite/sip    Postural Changes Seated upright at 90 degrees;Remain upright for at least 30 minutes after feeds/meals            Prognosis - 01/22/21 1734      Prognosis   Prognosis for Safe Diet Advancement Good      Individuals Consulted   Consulted and Agree with Results and Recommendations Patient;Family member/caregiver    Family Member Consulted daughter    Report Sent to  Referring physician           Problem List Patient Active Problem List   Diagnosis Date Noted  .  Protein-calorie malnutrition ([place]) 08/05/2020  . Cognitive decline 10/14/2019  . Hypercalcemia 04/28/2019  . Hyperparathyroidism, primary ([place]) 04/28/2019  . DDD (degenerative disc disease), lumbar 12/30/2017  . Weight loss 04/30/2017  . Breast cancer of upper-outer quadrant of left female breast ([facility]) 07/14/2016  . Constipation 02/12/2016  . Gait instability 05/08/2015  . History of stroke 07/05/2014  . Seborrheic keratoses, inflamed 03/08/2014  . Decreased hearing 03/08/2014  . Generalized OA 04/11/2013  . Pes anserinus bursitis of left knee 12/28/2012  . Lower back pain 05/11/2012  . Essential hypertension, benign 04/17/2012  . Hip pain, bilateral 04/17/2012  . Hyperlipidemia 04/17/2012  . Iron deficiency anemia 04/17/2012   Thank you,  Genene Churn, [place]  [facility] 01/22/2021, 5:42 PM  [facility] [address] [place], [place], [address] Phone: [phone]   Fax:  [phone]  Name: Dawn Church MRN: [id number] Date of Birth: [birth year]

## 2021-01-24 ENCOUNTER — Other Ambulatory Visit: Payer: Self-pay | Admitting: Family Medicine

## 2021-01-25 ENCOUNTER — Other Ambulatory Visit: Payer: Self-pay | Admitting: Family Medicine

## 2021-04-20 ENCOUNTER — Other Ambulatory Visit: Payer: Self-pay | Admitting: Family Medicine

## 2021-04-22 ENCOUNTER — Other Ambulatory Visit: Payer: Self-pay | Admitting: Family Medicine

## 2021-04-25 ENCOUNTER — Other Ambulatory Visit: Payer: Self-pay | Admitting: Family Medicine

## 2021-05-11 ENCOUNTER — Other Ambulatory Visit: Payer: Self-pay | Admitting: Family Medicine

## 2021-06-09 ENCOUNTER — Encounter (HOSPITAL_COMMUNITY): Payer: Self-pay | Admitting: Emergency Medicine

## 2021-06-09 ENCOUNTER — Emergency Department (HOSPITAL_COMMUNITY)
Admission: EM | Admit: 2021-06-09 | Discharge: 2021-06-09 | Disposition: A | Discharge status: Home / Self Care | Payer: Medicare PPO | Attending: Emergency Medicine | Admitting: Emergency Medicine

## 2021-06-09 ENCOUNTER — Emergency Department (HOSPITAL_COMMUNITY): Payer: Medicare PPO

## 2021-06-09 ENCOUNTER — Other Ambulatory Visit: Payer: Self-pay

## 2021-06-09 DIAGNOSIS — Z9012 Acquired absence of left breast and nipple: Secondary | ICD-10-CM | POA: Diagnosis not present

## 2021-06-09 DIAGNOSIS — I10 Essential (primary) hypertension: Secondary | ICD-10-CM | POA: Insufficient documentation

## 2021-06-09 DIAGNOSIS — R464 Slowness and poor responsiveness: Secondary | ICD-10-CM | POA: Diagnosis not present

## 2021-06-09 DIAGNOSIS — Z79899 Other long term (current) drug therapy: Secondary | ICD-10-CM | POA: Diagnosis not present

## 2021-06-09 DIAGNOSIS — R404 Transient alteration of awareness: Secondary | ICD-10-CM

## 2021-06-09 DIAGNOSIS — Z853 Personal history of malignant neoplasm of breast: Secondary | ICD-10-CM | POA: Diagnosis not present

## 2021-06-09 DIAGNOSIS — Z7982 Long term (current) use of aspirin: Secondary | ICD-10-CM | POA: Diagnosis not present

## 2021-06-09 DIAGNOSIS — R531 Weakness: Secondary | ICD-10-CM | POA: Insufficient documentation

## 2021-06-09 DIAGNOSIS — R4182 Altered mental status, unspecified: Secondary | ICD-10-CM | POA: Diagnosis not present

## 2021-06-09 LAB — URINALYSIS, ROUTINE W REFLEX MICROSCOPIC
Bilirubin Urine: NEGATIVE
Glucose, UA: NEGATIVE mg/dL
Hgb urine dipstick: NEGATIVE
Ketones, ur: NEGATIVE mg/dL
Leukocytes,Ua: NEGATIVE
Nitrite: NEGATIVE
Protein, ur: NEGATIVE mg/dL
Specific Gravity, Urine: 1.018 (ref 1.005–1.030)
pH: 6 (ref 5.0–8.0)

## 2021-06-09 LAB — CBC WITH DIFFERENTIAL/PLATELET
Abs Immature Granulocytes: 0 10*3/uL (ref 0.00–0.07)
Basophils Absolute: 0 10*3/uL (ref 0.0–0.1)
Basophils Relative: 1 %
Eosinophils Absolute: 0.1 10*3/uL (ref 0.0–0.5)
Eosinophils Relative: 2 %
HCT: 34.7 % — ABNORMAL LOW (ref 36.0–46.0)
Hemoglobin: 11 g/dL — ABNORMAL LOW (ref 12.0–15.0)
Immature Granulocytes: 0 %
Lymphocytes Relative: 35 %
Lymphs Abs: 1.4 10*3/uL (ref 0.7–4.0)
MCH: 34.4 pg — ABNORMAL HIGH (ref 26.0–34.0)
MCHC: 31.7 g/dL (ref 30.0–36.0)
MCV: 108.4 fL — ABNORMAL HIGH (ref 80.0–100.0)
Monocytes Absolute: 0.3 10*3/uL (ref 0.1–1.0)
Monocytes Relative: 6 %
Neutro Abs: 2.2 10*3/uL (ref 1.7–7.7)
Neutrophils Relative %: 56 %
Platelets: 215 10*3/uL (ref 150–400)
RBC: 3.2 MIL/uL — ABNORMAL LOW (ref 3.87–5.11)
RDW: 12.5 % (ref 11.5–15.5)
WBC: 4 10*3/uL (ref 4.0–10.5)
nRBC: 0 % (ref 0.0–0.2)

## 2021-06-09 LAB — COMPREHENSIVE METABOLIC PANEL
ALT: 11 U/L (ref 0–44)
AST: 22 U/L (ref 15–41)
Albumin: 3.5 g/dL (ref 3.5–5.0)
Alkaline Phosphatase: 74 U/L (ref 38–126)
Anion gap: 6 (ref 5–15)
BUN: 28 mg/dL — ABNORMAL HIGH (ref 8–23)
CO2: 29 mmol/L (ref 22–32)
Calcium: 10.9 mg/dL — ABNORMAL HIGH (ref 8.9–10.3)
Chloride: 105 mmol/L (ref 98–111)
Creatinine, Ser: 0.9 mg/dL (ref 0.44–1.00)
GFR, Estimated: >60 mL/min (ref >60)
Glucose, Bld: 91 mg/dL (ref 70–99)
Potassium: 3.7 mmol/L (ref 3.5–5.1)
Sodium: 140 mmol/L (ref 135–145)
Total Bilirubin: 0.9 mg/dL (ref 0.3–1.2)
Total Protein: 6.5 g/dL (ref 6.5–8.1)

## 2021-06-09 LAB — TROPONIN I (HIGH SENSITIVITY): Troponin I (High Sensitivity): <2 ng/L (ref <18)

## 2021-06-09 LAB — RAPID URINE DRUG SCREEN, HOSP PERFORMED
Amphetamines: NOT DETECTED
Barbiturates: NOT DETECTED
Benzodiazepines: NOT DETECTED
Cocaine: NOT DETECTED
Opiates: POSITIVE — AB
Tetrahydrocannabinol: NOT DETECTED

## 2021-06-09 LAB — CBG MONITORING, ED: Glucose-Capillary: 84 mg/dL (ref 70–99)

## 2021-06-09 LAB — AMMONIA: Ammonia: 22 umol/L (ref 9–35)

## 2021-06-09 MED ORDER — SODIUM CHLORIDE 0.9 % IV BOLUS
1000.0000 mL | Freq: Once | INTRAVENOUS | Status: AC
  Administered 2021-06-09: 1000 mL via INTRAVENOUS

## 2021-06-09 NOTE — ED Notes (Signed)
Pt is a hard stick 

## 2021-06-09 NOTE — Discharge Instructions (Addendum)
Call your primary care doctor or specialist as discussed in the next 2-3 days.   Return immediately back to the ER if:  Your symptoms worsen within the next 12-24 hours. You develop new symptoms such as new fevers, persistent vomiting, new pain, shortness of breath, or new weakness or numbness, or if you have any other concerns.  

## 2021-06-09 NOTE — ED Provider Notes (Addendum)
[facility] EMERGENCY DEPARTMENT Provider Note   CSN: [id number] Arrival date & time: 06/09/21  1508     History Chief Complaint  Patient presents with   Weakness    Dawn Church is a 87 y.o. female.  Patient was last seen this morning around 10:11 AM.  When they went to check up on her again she was laying on the side of her bed and appeared unresponsive.  EMS was called and she was brought to the ER.  Otherwise per family no additional reports of fevers or cough or vomiting or diarrhea.  Patient herself denies any pain.  She states she is unsure what happened.      Past Medical History:  Diagnosis Date   Anemia    Ankle swelling    Cancer ([facility]) 06/22/2016   left breast DCIS   CVA (cerebral infarction)    Epigastric pain    GERD (gastroesophageal reflux disease)    Hemochromatosis    Hx of seasonal allergies    Hypercholesteremia    Hypertension    Prolonged QT interval    Stomach ulcer    Stroke ([employer]) 2015   TIA (transient ischemic attack)     Patient Active Problem List   Diagnosis Date Noted   Protein-calorie malnutrition ([place]) 08/05/2020   Cognitive decline 10/14/2019   Hypercalcemia 04/28/2019   Hyperparathyroidism, primary ([place]) 04/28/2019   DDD (degenerative disc disease), lumbar 12/30/2017   Weight loss 04/30/2017   Breast cancer of upper-outer quadrant of left female breast ([place]) 07/14/2016   Constipation 02/12/2016   Gait instability 05/08/2015   History of stroke 07/05/2014   Seborrheic keratoses, inflamed 03/08/2014   Decreased hearing 03/08/2014   Generalized OA 04/11/2013   Pes anserinus bursitis of left knee 12/28/2012   Lower back pain 05/11/2012   Essential hypertension, benign 04/17/2012   Hip pain, bilateral 04/17/2012   Hyperlipidemia 04/17/2012   Iron deficiency anemia 04/17/2012    Past Surgical History:  Procedure Laterality Date   APPENDECTOMY     BREAST BIOPSY     Bilateral   BREAST BIOPSY     bilateral   BREAST  LUMPECTOMY WITH RADIOACTIVE SEED LOCALIZATION Left 08/19/2016   Procedure: LEFT BREAST LUMPECTOMY WITH RADIOACTIVE SEED LOCALIZATION;  Surgeon: Excell Seltzer, MD;  Location: [place];  Service: General;  Laterality: Left;   CATARACT EXTRACTION, BILATERAL     ESOPHAGOGASTRODUODENOSCOPY  12/20/2010   SHOULDER OPEN ROTATOR CUFF REPAIR     right    VAGINAL HYSTERECTOMY       OB History     Gravida  10   Para  10   Term  10   Preterm      AB      Living  10      SAB      IAB      Ectopic      Multiple      Live Births              Family History  Problem Relation Age of Onset   Diabetes Other    Cancer Other    Cancer Daughter    Colon cancer Neg Hx     Social History   Tobacco Use   Smoking status: Never   Smokeless tobacco: Never  Vaping Use   Vaping Use: Never used  Substance Use Topics   Alcohol use: No   Drug use: No    Home Medications Prior to Admission medications  Medication Sig Start Date End Date Taking? Authorizing Provider  Acetaminophen (APAP ARTHRITIS PO) Take 2 tablets by mouth as needed.   Yes [provider]  amLODipine (NORVASC) 5 MG tablet TAKE 1 TABLET BY MOUTH EVERY DAY IN THE MORNING Patient taking differently: Take 5 mg by mouth daily. 10/01/20  Yes [place], Modena Nunnery, MD  aspirin EC 81 MG tablet Take 2 tablets (162 mg total) by mouth daily. 12/26/16  Yes [place], Modena Nunnery, MD  atorvastatin (LIPITOR) 10 MG tablet TAKE 1 TABLET BY MOUTH EVERYDAY AT BEDTIME Patient taking differently: Take 10 mg by mouth daily. 10/01/20  Yes [place], Modena Nunnery, MD  cinacalcet (SENSIPAR) 30 MG tablet TAKE 1 TABLET (30 MG TOTAL) BY MOUTH DAILY WITH BREAKFAST. 05/04/20  Yes [place], Marella Chimes, MD  HYDROcodone-acetaminophen [facility]) 5-325 MG tablet Take 1/2 tablet every BID prn 10/08/20  Yes [place], Modena Nunnery, MD  letrozole [facility]) 2.5 MG tablet Take 1 tablet (2.5 mg total) by mouth daily. 08/17/20  Yes Nicholas Lose, MD   mirtazapine (REMERON) 7.5 MG tablet TAKE 1 TABLET BY MOUTH AT BEDTIME. 01/24/21  Yes Susy Frizzle, MD  omega-3 acid ethyl esters (LOVAZA) 1 g capsule TAKE 1 CAPSULE BY MOUTH TWICE A DAY 01/25/21  Yes Susy Frizzle, MD  pantoprazole (PROTONIX) 40 MG tablet TAKE 1 TABLET BY MOUTH EVERY DAY BEFORE BREAKFAST Patient taking differently: Take 40 mg by mouth daily. 06/04/20  Yes [place], Modena Nunnery, MD  ramipril (ALTACE) 10 MG capsule TAKE 1 CAPSULE BY MOUTH TWICE A DAY 07/30/20  Yes [place], Modena Nunnery, MD  triamterene-hydrochlorothiazide [facility]) 37.5-25 MG tablet TAKE 1 TABLET BY MOUTH EVERY DAY 08/15/20  Yes [place], Modena Nunnery, MD  lactulose (CHRONULAC) 10 GM/15ML solution TAKE 30 MLS (20 G TOTAL) BY MOUTH DAILY AS NEEDED FOR MILD CONSTIPATION. 10/01/20   Alycia Rossetti, MD  Multiple Vitamin (MULTIVITAMIN) tablet Take 1 tablet by mouth daily.    [provider]    Allergies    Codeine and Ultram [tramadol]  Review of Systems   Review of Systems  Constitutional:  Negative for fever.  HENT:  Negative for ear pain.   Eyes:  Negative for pain.  Respiratory:  Negative for cough.   Cardiovascular:  Negative for chest pain.  Gastrointestinal:  Negative for abdominal pain.  Genitourinary:  Negative for flank pain.  Musculoskeletal:  Negative for back pain.  Skin:  Negative for rash.  Neurological:  Negative for headaches.   Physical Exam Updated Vital Signs BP (!) 150/99   Pulse (!) 52   Temp (!) 96.9 F (36.1 C) (Axillary)   Resp 15   Ht 5\' 3"  (1.6 m)   Wt 65.8 kg   LMP  (LMP Unknown)   SpO2 94%   BMI 25.70 kg/m   Physical Exam Constitutional:      General: She is not in acute distress.    Appearance: Normal appearance.     Comments: Slow to respond, sleepy appearing but answers questions.  HENT:     Head: Normocephalic.     Nose: Nose normal.  Eyes:     Extraocular Movements: Extraocular movements intact.  Cardiovascular:     Rate and Rhythm: Normal rate.   Pulmonary:     Effort: Pulmonary effort is normal.  Musculoskeletal:        General: Normal range of motion.     Cervical back: Normal range of motion.  Neurological:     General: No focal deficit present.  Comments: Mild to moderately obtunded    ED Results / Procedures / Treatments   Labs (all labs ordered are listed, but only abnormal results are displayed) Labs Reviewed  CBC WITH DIFFERENTIAL/PLATELET - Abnormal; Notable for the following components:      Result Value   RBC 3.20 (*)    Hemoglobin 11.0 (*)    HCT 34.7 (*)    MCV 108.4 (*)    MCH 34.4 (*)    All other components within normal limits  COMPREHENSIVE METABOLIC PANEL - Abnormal; Notable for the following components:   BUN 28 (*)    Calcium 10.9 (*)    All other components within normal limits  RAPID URINE DRUG SCREEN, HOSP PERFORMED - Abnormal; Notable for the following components:   Opiates POSITIVE (*)    All other components within normal limits  URINALYSIS, ROUTINE W REFLEX MICROSCOPIC  AMMONIA  CBG MONITORING, ED  TROPONIN I (HIGH SENSITIVITY)    EKG None  Radiology DG Chest 1 View  Result Date: 06/09/2021 CLINICAL DATA:  Altered level consciousness. EXAM: CHEST  1 VIEW COMPARISON:  01/11/2013 FINDINGS: No focal consolidation. No pleural effusion or pneumothorax. Heart and mediastinal contours are unremarkable. No acute osseous abnormality. IMPRESSION: No active disease. Electronically Signed   By: Kathreen Devoid M.D.   On: 06/09/2021 16:30   CT Head Wo Contrast  Result Date: 06/09/2021 CLINICAL DATA:  Mental status changes, found unresponsive EXAM: CT HEAD WITHOUT CONTRAST TECHNIQUE: Contiguous axial images were obtained from the base of the skull through the vertex without intravenous contrast. COMPARISON:  03/14/2009 FINDINGS: Brain: There is atrophy and chronic small vessel disease changes. Old right basal ganglia lacunar infarcts. No acute intracranial abnormality. Specifically, no hemorrhage,  hydrocephalus, mass lesion, acute infarction, or significant intracranial injury. Vascular: No hyperdense vessel or unexpected calcification. Skull: No acute calvarial abnormality. Sinuses/Orbits: No acute findings Other: None IMPRESSION: Atrophy, chronic microvascular disease. No acute intracranial abnormality. Old right basal ganglia lacunar infarcts. Electronically Signed   By: Rolm Baptise M.D.   On: 06/09/2021 16:19    Procedures Procedures   Medications Ordered in ED Medications  sodium chloride 0.9 % bolus 1,000 mL (1,000 mLs Intravenous New Bag/Given 06/09/21 1724)    ED Course  I have reviewed the triage vital signs and the nursing notes.  Pertinent labs & imaging results that were available during my care of the patient were reviewed by me and considered in my medical decision making (see chart for details).    MDM Rules/Calculators/A&P                           Labs unremarkable.  UDS was positive for opiates.  CT scan shows no acute findings.  Patient observed in the ER for few hours, is now awake and alert back to her baseline speaking clearly and sharply.  She has no focal neurodeficit.  5/5 strength all extremities no facial droop no slurred speech noted.  Suspect possible accidental drug overdose, no additional signs of infection or trauma or other pathology noted.  Will recommend outpatient follow-up with her doctor within the week.  Recommending immediate return for worsening pain fevers or any additional concerns.  Final Clinical Impression(s) / ED Diagnoses Final diagnoses:  Generalized weakness    Rx / DC Orders ED Discharge Orders     None        Luna Fuse, MD 06/09/21 1800    Luna Fuse, MD  06/09/21 1802  

## 2021-06-09 NOTE — ED Triage Notes (Signed)
Pt to the ED [facility] with last known well was between 1000-1100.  Pt was found in the floor and was unresponsive.  Pt had a stroke in the past 4 years ago, deficits unknown.  CBG 125, no home O2

## 2021-06-09 NOTE — ED Notes (Signed)
Put a pure wick on pt.

## 2021-06-18 DIAGNOSIS — Z79891 Long term (current) use of opiate analgesic: Secondary | ICD-10-CM | POA: Diagnosis not present

## 2021-06-19 DIAGNOSIS — H04123 Dry eye syndrome of bilateral lacrimal glands: Secondary | ICD-10-CM | POA: Diagnosis not present

## 2021-07-16 DIAGNOSIS — I10 Essential (primary) hypertension: Secondary | ICD-10-CM | POA: Diagnosis not present

## 2021-07-18 DIAGNOSIS — I10 Essential (primary) hypertension: Secondary | ICD-10-CM | POA: Diagnosis not present

## 2021-07-18 DIAGNOSIS — F0390 Unspecified dementia without behavioral disturbance: Secondary | ICD-10-CM | POA: Diagnosis not present

## 2021-07-18 DIAGNOSIS — K219 Gastro-esophageal reflux disease without esophagitis: Secondary | ICD-10-CM | POA: Diagnosis not present

## 2021-07-18 DIAGNOSIS — N189 Chronic kidney disease, unspecified: Secondary | ICD-10-CM | POA: Diagnosis not present

## 2021-07-18 DIAGNOSIS — E559 Vitamin D deficiency, unspecified: Secondary | ICD-10-CM | POA: Diagnosis not present

## 2021-07-18 DIAGNOSIS — E210 Primary hyperparathyroidism: Secondary | ICD-10-CM | POA: Diagnosis not present

## 2021-07-18 DIAGNOSIS — D539 Nutritional anemia, unspecified: Secondary | ICD-10-CM | POA: Diagnosis not present

## 2021-07-18 DIAGNOSIS — I639 Cerebral infarction, unspecified: Secondary | ICD-10-CM | POA: Diagnosis not present

## 2021-07-18 DIAGNOSIS — E785 Hyperlipidemia, unspecified: Secondary | ICD-10-CM | POA: Diagnosis not present

## 2021-08-15 NOTE — Assessment & Plan Note (Signed)
Left Lumpectomy 08/19/16: IDC with DCIS 1.3 cm, DCIS close to 0.1 cm to all margins;ER 100%, PR 100%, Ki-67 5%, HER-2 negative ratio 1.05, T1cNx (Stage 1A) Adj XRT completed 10/20/16  Current treatment: Letrozole 2.5 mg daily started05/11/2016 Bone density 12/19/2016: Normal T score 0.4  Letrozole toxicities: Denies any hot flashes or myalgias. Slight discomfort in the right hip intermittently.  Breast cancer surveillance: Breast exam 08/16/2021: Benign Bone density 12/18/2016 T score 0.4: Normal    I renewed her prescription for letrozole.    Return to clinic in 1 year for follow-up.

## 2021-08-15 NOTE — Progress Notes (Signed)
Patient Care Team: Celene Squibb, MD as PCP - General (Internal Medicine) Delice Bison, Charlestine Massed, NP as Nurse Practitioner (Hematology and Oncology) Excell Seltzer, MD (Inactive) as Consulting Physician (General Surgery) Nicholas Lose, MD as Consulting Physician (Hematology and Oncology) Kyung Rudd, MD as Consulting Physician (Radiation Oncology)  DIAGNOSIS:    ICD-10-CM   1. Malignant neoplasm of upper-outer quadrant of left breast in female, estrogen receptor positive ([place])  C50.412    Z17.0       SUMMARY OF ONCOLOGIC HISTORY: Oncology History  Breast cancer of upper-outer quadrant of left female breast ([place])  06/10/2016 Initial Diagnosis   Left breast biopsy 2:00 position: Grade 2 IDC with DCIS, ER 100%, PR 100%, Ki-67 5%, HER-2 negative ratio 1.05, 1.2 cm irregular mass left breast 2:00 position 8 cm from nipple, T1c N0 stage IA clinical stage   08/19/2016 Surgery   Left Lumpectomy: IDC with DCIS 1.3 cm, DCIS close to 0.1 cm to all margins;ER 100%, PR 100%, Ki-67 5%, HER-2 negative ratio 1.05, T1cNx (Stage 1A)   09/22/2016 - 10/20/2016 Radiation Therapy   Radiation [name]): 1)Left breast/ 42.5 Gy in 17 fractions.  2) Left breast boost/ 10 Gy in 4 fractions   12/2016 -  Anti-estrogen oral therapy   Letrozole 2.5 mg daily     CHIEF COMPLIANT: Follow-up of left breast cancer on letrozole therapy  INTERVAL HISTORY: Dawn Church is a 87 y.o. with above-mentioned history of  left breast cancer treated with lumpectomy, radiation, and who is currently on antiestrogen therapy with letrozole. She presents to the clinic today for annual follow-up.  Patient had a neurological event in October where she was not able to be woken up.  She went to the [facility] emergency room and they determined that it was her medications that made her unconscious and therefore many of her medications have been removed.  She is still currently taking letrozole does not have any adverse effects from  that.  She denies any lumps or nodules in the breast.  She has not had a mammogram.  ALLERGIES:  is allergic to codeine and ultram [tramadol].  MEDICATIONS:  Current Outpatient Medications  Medication Sig Dispense Refill   Acetaminophen (APAP ARTHRITIS PO) Take 2 tablets by mouth as needed.     amLODipine (NORVASC) 5 MG tablet TAKE 1 TABLET BY MOUTH EVERY DAY IN THE MORNING (Patient taking differently: Take 5 mg by mouth daily.) 90 tablet 2   aspirin EC 81 MG tablet Take 2 tablets (162 mg total) by mouth daily.     atorvastatin (LIPITOR) 10 MG tablet TAKE 1 TABLET BY MOUTH EVERYDAY AT BEDTIME (Patient taking differently: Take 10 mg by mouth daily.) 90 tablet 2   cinacalcet (SENSIPAR) 30 MG tablet TAKE 1 TABLET (30 MG TOTAL) BY MOUTH DAILY WITH BREAKFAST. 90 tablet 0   HYDROcodone-acetaminophen (NORCO) 5-325 MG tablet Take 1/2 tablet every BID prn 30 tablet 0   lactulose (CHRONULAC) 10 GM/15ML solution TAKE 30 MLS (20 G TOTAL) BY MOUTH DAILY AS NEEDED FOR MILD CONSTIPATION. 473 mL 2   letrozole (FEMARA) 2.5 MG tablet Take 1 tablet (2.5 mg total) by mouth daily. 90 tablet 3   mirtazapine (REMERON) 7.5 MG tablet TAKE 1 TABLET BY MOUTH AT BEDTIME. 90 tablet 0   Multiple Vitamin (MULTIVITAMIN) tablet Take 1 tablet by mouth daily.     omega-3 acid ethyl esters (LOVAZA) 1 g capsule TAKE 1 CAPSULE BY MOUTH TWICE A DAY 180 capsule 0   pantoprazole (  PROTONIX) 40 MG tablet TAKE 1 TABLET BY MOUTH EVERY DAY BEFORE BREAKFAST (Patient taking differently: Take 40 mg by mouth daily.) 90 tablet 2   ramipril (ALTACE) 10 MG capsule TAKE 1 CAPSULE BY MOUTH TWICE A DAY 180 capsule 2   triamterene-hydrochlorothiazide (MAXZIDE-25) 37.5-25 MG tablet TAKE 1 TABLET BY MOUTH EVERY DAY 90 tablet 1   No current facility-administered medications for this visit.    PHYSICAL EXAMINATION: ECOG PERFORMANCE STATUS: 1 - Symptomatic but completely ambulatory  Vitals:   08/16/21 1121  BP: (!) 142/56  Pulse: 78  Resp: 16   Temp: (!) 97.5 F (36.4 C)  SpO2: 100%   Filed Weights   08/16/21 1121  Weight: 128 lb 6.4 oz (58.2 kg)      LABORATORY DATA:  I have reviewed the data as listed CMP Latest Ref Rng & Units 06/09/2021 05/18/2020 05/18/2020  Glucose 70 - 99 mg/dL 91 - 108(H)  BUN 8 - 23 mg/dL 28(H) - 12  Creatinine 0.44 - 1.00 mg/dL 0.90 - 0.87  Sodium 135 - 145 mmol/L 140 - 138  Potassium 3.5 - 5.1 mmol/L 3.7 - 3.5  Chloride 98 - 111 mmol/L 105 - 101  CO2 22 - 32 mmol/L 29 - 25  Calcium 8.9 - 10.3 mg/dL 10.9(H) 9.7 9.7  Total Protein 6.5 - 8.1 g/dL 6.5 - 6.5  Total Bilirubin 0.3 - 1.2 mg/dL 0.9 - 0.5  Alkaline Phos 38 - 126 U/L 74 - -  AST 15 - 41 U/L 22 - 17  ALT 0 - 44 U/L 11 - 7    Lab Results  Component Value Date   WBC 4.0 06/09/2021   HGB 11.0 (L) 06/09/2021   HCT 34.7 (L) 06/09/2021   MCV 108.4 (H) 06/09/2021   PLT 215 06/09/2021   NEUTROABS 2.2 06/09/2021    ASSESSMENT & PLAN:  Breast cancer of upper-outer quadrant of left female breast ([place]) Left Lumpectomy 08/19/16: IDC with DCIS 1.3 cm, DCIS close to 0.1 cm to all margins;ER 100%, PR 100%, Ki-67 5%, HER-2 negative ratio 1.05, T1cNx (Stage 1A) Adj XRT completed 10/20/16   Current treatment: Letrozole 2.5 mg daily started 01/02/2017 completed 08/16/2021 Bone density 12/19/2016: Normal T score 0.4    Breast cancer surveillance: Breast exam 08/16/2021: Benign Bone density 12/18/2016 T score 0.4: Normal   I recommended discontinuation of letrozole therapy given her age and her recent health issues. I also do not recommend any further mammograms given her age and health issues.   Return to clinic on an as-needed basis.    No orders of the defined types were placed in this encounter.  The patient has a good understanding of the overall plan. she agrees with it. she will call with any problems that may develop before the next visit here.  Total time spent: 20 mins including face to face time and time spent for planning,  charting and coordination of care  Rulon Eisenmenger, MD, MPH 08/16/2021  I, Thana Ates, am acting as scribe for Dr. Nicholas Lose.  I have reviewed the above documentation for accuracy and completeness, and I agree with the above.

## 2021-08-16 ENCOUNTER — Other Ambulatory Visit: Payer: Self-pay

## 2021-08-16 ENCOUNTER — Inpatient Hospital Stay: Payer: Medicare PPO | Attending: Hematology and Oncology | Admitting: Hematology and Oncology

## 2021-08-16 DIAGNOSIS — C50412 Malignant neoplasm of upper-outer quadrant of left female breast: Secondary | ICD-10-CM | POA: Diagnosis not present

## 2021-08-16 DIAGNOSIS — Z170 Estrogen receptor positive status [ER+]: Secondary | ICD-10-CM | POA: Diagnosis not present

## 2021-08-16 DIAGNOSIS — Z923 Personal history of irradiation: Secondary | ICD-10-CM | POA: Insufficient documentation

## 2021-08-16 DIAGNOSIS — Z79899 Other long term (current) drug therapy: Secondary | ICD-10-CM | POA: Insufficient documentation

## 2021-08-16 DIAGNOSIS — Z885 Allergy status to narcotic agent status: Secondary | ICD-10-CM | POA: Insufficient documentation

## 2021-08-16 DIAGNOSIS — Z888 Allergy status to other drugs, medicaments and biological substances status: Secondary | ICD-10-CM | POA: Diagnosis not present

## 2021-08-16 DIAGNOSIS — Z79811 Long term (current) use of aromatase inhibitors: Secondary | ICD-10-CM | POA: Insufficient documentation

## 2021-10-16 DIAGNOSIS — R197 Diarrhea, unspecified: Secondary | ICD-10-CM | POA: Diagnosis not present

## 2021-11-08 DIAGNOSIS — I10 Essential (primary) hypertension: Secondary | ICD-10-CM | POA: Diagnosis not present

## 2021-11-08 DIAGNOSIS — E559 Vitamin D deficiency, unspecified: Secondary | ICD-10-CM | POA: Diagnosis not present

## 2021-11-14 DIAGNOSIS — E785 Hyperlipidemia, unspecified: Secondary | ICD-10-CM | POA: Diagnosis not present

## 2021-11-14 DIAGNOSIS — I639 Cerebral infarction, unspecified: Secondary | ICD-10-CM | POA: Diagnosis not present

## 2021-11-14 DIAGNOSIS — I10 Essential (primary) hypertension: Secondary | ICD-10-CM | POA: Diagnosis not present

## 2021-11-14 DIAGNOSIS — D539 Nutritional anemia, unspecified: Secondary | ICD-10-CM | POA: Diagnosis not present

## 2021-11-14 DIAGNOSIS — F0390 Unspecified dementia without behavioral disturbance: Secondary | ICD-10-CM | POA: Diagnosis not present

## 2021-11-14 DIAGNOSIS — K219 Gastro-esophageal reflux disease without esophagitis: Secondary | ICD-10-CM | POA: Diagnosis not present

## 2021-11-14 DIAGNOSIS — E210 Primary hyperparathyroidism: Secondary | ICD-10-CM | POA: Diagnosis not present

## 2021-11-14 DIAGNOSIS — N189 Chronic kidney disease, unspecified: Secondary | ICD-10-CM | POA: Diagnosis not present

## 2021-11-14 DIAGNOSIS — E559 Vitamin D deficiency, unspecified: Secondary | ICD-10-CM | POA: Diagnosis not present

## 2022-03-13 DIAGNOSIS — I10 Essential (primary) hypertension: Secondary | ICD-10-CM | POA: Diagnosis not present

## 2022-03-13 DIAGNOSIS — E210 Primary hyperparathyroidism: Secondary | ICD-10-CM | POA: Diagnosis not present

## 2022-03-13 DIAGNOSIS — D539 Nutritional anemia, unspecified: Secondary | ICD-10-CM | POA: Diagnosis not present

## 2022-03-18 DIAGNOSIS — I639 Cerebral infarction, unspecified: Secondary | ICD-10-CM | POA: Diagnosis not present

## 2022-03-18 DIAGNOSIS — K219 Gastro-esophageal reflux disease without esophagitis: Secondary | ICD-10-CM | POA: Diagnosis not present

## 2022-03-18 DIAGNOSIS — F0390 Unspecified dementia without behavioral disturbance: Secondary | ICD-10-CM | POA: Diagnosis not present

## 2022-03-18 DIAGNOSIS — E559 Vitamin D deficiency, unspecified: Secondary | ICD-10-CM | POA: Diagnosis not present

## 2022-03-18 DIAGNOSIS — I10 Essential (primary) hypertension: Secondary | ICD-10-CM | POA: Diagnosis not present

## 2022-03-18 DIAGNOSIS — E785 Hyperlipidemia, unspecified: Secondary | ICD-10-CM | POA: Diagnosis not present

## 2022-03-18 DIAGNOSIS — E210 Primary hyperparathyroidism: Secondary | ICD-10-CM | POA: Diagnosis not present

## 2022-03-18 DIAGNOSIS — N189 Chronic kidney disease, unspecified: Secondary | ICD-10-CM | POA: Diagnosis not present

## 2022-03-18 DIAGNOSIS — D539 Nutritional anemia, unspecified: Secondary | ICD-10-CM | POA: Diagnosis not present

## 2022-04-10 ENCOUNTER — Emergency Department (HOSPITAL_COMMUNITY): Payer: Medicare PPO

## 2022-04-10 ENCOUNTER — Other Ambulatory Visit: Payer: Self-pay

## 2022-04-10 ENCOUNTER — Inpatient Hospital Stay (HOSPITAL_COMMUNITY)
Admission: EM | Admit: 2022-04-10 | Discharge: 2022-05-02 | DRG: 389 | Disposition: E | Payer: Medicare PPO | Attending: Internal Medicine | Admitting: Internal Medicine

## 2022-04-10 ENCOUNTER — Inpatient Hospital Stay (HOSPITAL_COMMUNITY): Payer: Medicare PPO

## 2022-04-10 ENCOUNTER — Encounter (HOSPITAL_COMMUNITY): Payer: Self-pay | Admitting: Emergency Medicine

## 2022-04-10 DIAGNOSIS — E213 Hyperparathyroidism, unspecified: Secondary | ICD-10-CM | POA: Diagnosis not present

## 2022-04-10 DIAGNOSIS — Z79899 Other long term (current) drug therapy: Secondary | ICD-10-CM

## 2022-04-10 DIAGNOSIS — Z8711 Personal history of peptic ulcer disease: Secondary | ICD-10-CM

## 2022-04-10 DIAGNOSIS — E876 Hypokalemia: Secondary | ICD-10-CM | POA: Diagnosis present

## 2022-04-10 DIAGNOSIS — Z885 Allergy status to narcotic agent status: Secondary | ICD-10-CM

## 2022-04-10 DIAGNOSIS — E039 Hypothyroidism, unspecified: Secondary | ICD-10-CM | POA: Diagnosis not present

## 2022-04-10 DIAGNOSIS — R451 Restlessness and agitation: Secondary | ICD-10-CM | POA: Diagnosis present

## 2022-04-10 DIAGNOSIS — D72829 Elevated white blood cell count, unspecified: Secondary | ICD-10-CM | POA: Diagnosis not present

## 2022-04-10 DIAGNOSIS — Z66 Do not resuscitate: Secondary | ICD-10-CM | POA: Diagnosis not present

## 2022-04-10 DIAGNOSIS — K56609 Unspecified intestinal obstruction, unspecified as to partial versus complete obstruction: Secondary | ICD-10-CM

## 2022-04-10 DIAGNOSIS — K5989 Other specified functional intestinal disorders: Secondary | ICD-10-CM | POA: Diagnosis not present

## 2022-04-10 DIAGNOSIS — F03918 Unspecified dementia, unspecified severity, with other behavioral disturbance: Secondary | ICD-10-CM | POA: Diagnosis present

## 2022-04-10 DIAGNOSIS — R001 Bradycardia, unspecified: Secondary | ICD-10-CM | POA: Diagnosis not present

## 2022-04-10 DIAGNOSIS — Z8673 Personal history of transient ischemic attack (TIA), and cerebral infarction without residual deficits: Secondary | ICD-10-CM

## 2022-04-10 DIAGNOSIS — K219 Gastro-esophageal reflux disease without esophagitis: Secondary | ICD-10-CM | POA: Diagnosis present

## 2022-04-10 DIAGNOSIS — I10 Essential (primary) hypertension: Secondary | ICD-10-CM | POA: Diagnosis present

## 2022-04-10 DIAGNOSIS — Z853 Personal history of malignant neoplasm of breast: Secondary | ICD-10-CM | POA: Diagnosis not present

## 2022-04-10 DIAGNOSIS — K5939 Other megacolon: Secondary | ICD-10-CM | POA: Diagnosis not present

## 2022-04-10 DIAGNOSIS — E7800 Pure hypercholesterolemia, unspecified: Secondary | ICD-10-CM | POA: Diagnosis present

## 2022-04-10 DIAGNOSIS — Z7982 Long term (current) use of aspirin: Secondary | ICD-10-CM

## 2022-04-10 DIAGNOSIS — K56601 Complete intestinal obstruction, unspecified as to cause: Principal | ICD-10-CM | POA: Diagnosis present

## 2022-04-10 DIAGNOSIS — K6389 Other specified diseases of intestine: Secondary | ICD-10-CM | POA: Diagnosis not present

## 2022-04-10 DIAGNOSIS — Z833 Family history of diabetes mellitus: Secondary | ICD-10-CM | POA: Diagnosis not present

## 2022-04-10 DIAGNOSIS — Z90710 Acquired absence of both cervix and uterus: Secondary | ICD-10-CM | POA: Diagnosis not present

## 2022-04-10 DIAGNOSIS — N281 Cyst of kidney, acquired: Secondary | ICD-10-CM | POA: Diagnosis not present

## 2022-04-10 DIAGNOSIS — E785 Hyperlipidemia, unspecified: Secondary | ICD-10-CM | POA: Diagnosis present

## 2022-04-10 LAB — CBC
HCT: 39.7 % (ref 36.0–46.0)
Hemoglobin: 12.7 g/dL (ref 12.0–15.0)
MCH: 34 pg (ref 26.0–34.0)
MCHC: 32 g/dL (ref 30.0–36.0)
MCV: 106.4 fL — ABNORMAL HIGH (ref 80.0–100.0)
RBC: 3.73 MIL/uL — ABNORMAL LOW (ref 3.87–5.11)
RDW: 13.2 % (ref 11.5–15.5)
WBC: 8.6 10*3/uL (ref 4.0–10.5)
nRBC: 0 % (ref 0.0–0.2)

## 2022-04-10 LAB — COMPREHENSIVE METABOLIC PANEL
ALT: 12 U/L (ref 0–44)
AST: 19 U/L (ref 15–41)
Albumin: 4.3 g/dL (ref 3.5–5.0)
Alkaline Phosphatase: 73 U/L (ref 38–126)
Anion gap: 11 (ref 5–15)
BUN: 21 mg/dL (ref 8–23)
CO2: 23 mmol/L (ref 22–32)
Calcium: 10.9 mg/dL — ABNORMAL HIGH (ref 8.9–10.3)
Chloride: 103 mmol/L (ref 98–111)
Creatinine, Ser: 0.84 mg/dL (ref 0.44–1.00)
GFR, Estimated: >60 mL/min (ref >60)
Glucose, Bld: 149 mg/dL — ABNORMAL HIGH (ref 70–99)
Potassium: 3.4 mmol/L — ABNORMAL LOW (ref 3.5–5.1)
Sodium: 137 mmol/L (ref 135–145)
Total Bilirubin: 0.8 mg/dL (ref 0.3–1.2)
Total Protein: 7.4 g/dL (ref 6.5–8.1)

## 2022-04-10 LAB — URINALYSIS, ROUTINE W REFLEX MICROSCOPIC
Bilirubin Urine: NEGATIVE
Glucose, UA: 50 mg/dL — AB
Hgb urine dipstick: NEGATIVE
Ketones, ur: 5 mg/dL — AB
Leukocytes,Ua: NEGATIVE
Nitrite: NEGATIVE
Protein, ur: NEGATIVE mg/dL
Specific Gravity, Urine: 1.016 (ref 1.005–1.030)
pH: 7 (ref 5.0–8.0)

## 2022-04-10 LAB — LIPASE, BLOOD: Lipase: 24 U/L (ref 11–51)

## 2022-04-10 MED ORDER — KETOROLAC TROMETHAMINE 15 MG/ML IJ SOLN
15.0000 mg | Freq: Four times a day (QID) | INTRAMUSCULAR | Status: DC | PRN
  Administered 2022-04-11 – 2022-04-14 (×2): 15 mg via INTRAVENOUS
  Filled 2022-04-10 (×2): qty 1

## 2022-04-10 MED ORDER — HYDRALAZINE HCL 20 MG/ML IJ SOLN
10.0000 mg | Freq: Once | INTRAMUSCULAR | Status: DC
  Filled 2022-04-10: qty 1

## 2022-04-10 MED ORDER — IOHEXOL 300 MG/ML  SOLN
80.0000 mL | Freq: Once | INTRAMUSCULAR | Status: AC | PRN
  Administered 2022-04-10: 80 mL via INTRAVENOUS

## 2022-04-10 MED ORDER — HEPARIN SODIUM (PORCINE) 5000 UNIT/ML IJ SOLN
5000.0000 [IU] | Freq: Three times a day (TID) | INTRAMUSCULAR | Status: DC
  Administered 2022-04-10 – 2022-04-14 (×11): 5000 [IU] via SUBCUTANEOUS
  Filled 2022-04-10 (×12): qty 1

## 2022-04-10 MED ORDER — POTASSIUM CHLORIDE IN NACL 20-0.45 MEQ/L-% IV SOLN
INTRAVENOUS | Status: DC
Start: 2022-04-10 — End: 2022-04-11
  Filled 2022-04-10 (×2): qty 1000

## 2022-04-10 MED ORDER — ONDANSETRON HCL 4 MG/2ML IJ SOLN
4.0000 mg | Freq: Once | INTRAMUSCULAR | Status: AC
Start: 2022-04-10 — End: 2022-04-10
  Administered 2022-04-10: 4 mg via INTRAVENOUS
  Filled 2022-04-10: qty 2

## 2022-04-10 MED ORDER — ACETAMINOPHEN 325 MG PO TABS: 650.0000 mg | ORAL_TABLET | Freq: Four times a day (QID) | ORAL | Status: DC | PRN

## 2022-04-10 MED ORDER — AMLODIPINE BESYLATE 5 MG PO TABS
5.0000 mg | ORAL_TABLET | Freq: Every day | ORAL | Status: DC
  Administered 2022-04-12 – 2022-04-14 (×3): 5 mg via ORAL
  Filled 2022-04-10 (×3): qty 1

## 2022-04-10 MED ORDER — RAMIPRIL 5 MG PO CAPS
10.0000 mg | ORAL_CAPSULE | Freq: Two times a day (BID) | ORAL | Status: DC
  Administered 2022-04-11 – 2022-04-14 (×7): 10 mg via ORAL
  Filled 2022-04-10 (×8): qty 2

## 2022-04-10 MED ORDER — ACETAMINOPHEN 650 MG RE SUPP: 650.0000 mg | Freq: Four times a day (QID) | RECTAL | Status: DC | PRN

## 2022-04-10 MED ORDER — HYDRALAZINE HCL 20 MG/ML IJ SOLN
10.0000 mg | INTRAMUSCULAR | Status: DC | PRN
  Administered 2022-04-10 – 2022-04-11 (×3): 10 mg via INTRAVENOUS
  Filled 2022-04-10 (×2): qty 1

## 2022-04-10 MED ORDER — DIATRIZOATE MEGLUMINE & SODIUM 66-10 % PO SOLN
90.0000 mL | Freq: Once | ORAL | Status: DC
  Filled 2022-04-10: qty 90

## 2022-04-10 MED ORDER — SODIUM CHLORIDE 0.9 % IV BOLUS
1000.0000 mL | Freq: Once | INTRAVENOUS | Status: AC
Start: 2022-04-10 — End: 2022-04-10
  Administered 2022-04-10: 1000 mL via INTRAVENOUS

## 2022-04-10 NOTE — H&P (Signed)
History and Physical    Dawn Church FIE:[id number] DOB: 1927/09/15 DOA: 04/04/2022  DOS: the patient was seen and examined on 04/27/2022  PCP: Celene Squibb, MD   Patient coming from: Home  I have personally briefly reviewed patient's old medical records in [facility]  Dawn Church, a 88 y/o followed for HTN, HLD, OA, hyperparathyroidism was in her usual health until 3 days PTA when she developed left-sided abdominal pain along with N/V. Due to persistent symptoms she presents to [facility] for evaluation.    ED Course: Afebrile, hypertensive 162/92 to 170/150, HR 73 RR 16. ED-PA exam notable for generalized abdominal tenderness. Lab: K 3.4, Glu 149, WBC 8.6, Hgb 12.7. CT A/P - c/w SBO.  Review of Systems:  Review of Systems  Constitutional:  Negative for chills, fever and weight loss.  HENT: Negative.    Eyes: Negative.   Respiratory:  Negative for cough and shortness of breath.   Cardiovascular:  Negative for chest pain.  Gastrointestinal:  Positive for abdominal pain, nausea and vomiting.  Genitourinary: Negative.   Musculoskeletal: Negative.   Skin: Negative.   Neurological: Negative.   Endo/Heme/Allergies: Negative.   Psychiatric/Behavioral: Negative.      Past Medical History:  Diagnosis Date   Anemia    Ankle swelling    Cancer ([place]) 06/22/2016   left breast DCIS   CVA (cerebral infarction)    Epigastric pain    GERD (gastroesophageal reflux disease)    Hemochromatosis    Hx of seasonal allergies    Hypercholesteremia    Hypertension    Prolonged QT interval    Stomach ulcer    Stroke ([place]) 2015   TIA (transient ischemic attack)     Past Surgical History:  Procedure Laterality Date   APPENDECTOMY     BREAST BIOPSY     Bilateral   BREAST BIOPSY     bilateral   BREAST LUMPECTOMY WITH RADIOACTIVE SEED LOCALIZATION Left 08/19/2016   Procedure: LEFT BREAST LUMPECTOMY WITH RADIOACTIVE SEED LOCALIZATION;  Surgeon: Excell Seltzer, MD;  Location: [facility];  Service: General;  Laterality: Left;   CATARACT EXTRACTION, BILATERAL     ESOPHAGOGASTRODUODENOSCOPY  12/20/2010   SHOULDER OPEN ROTATOR CUFF REPAIR     right    VAGINAL HYSTERECTOMY      Soc Hx - married at 28, widowed at 75, 6 sons - one deceased, 4 daughters - one deceased, 81 grandchildren, 66 great-grands. LIves in her own home, family with her at all times. Needs some assistance with ADLs.    reports that she has never smoked. She has never used smokeless tobacco. She reports that she does not drink alcohol and does not use drugs.  Allergies  Allergen Reactions   Codeine Other (See Comments)    Caused patient to "space out"   Ultram [Tramadol]     nausea    Family History  Problem Relation Age of Onset   Diabetes Other    Cancer Other    Cancer Daughter    Colon cancer Neg Hx     Prior to Admission medications   Medication Sig Start Date End Date Taking? Authorizing Provider  acetaminophen (TYLENOL) 500 MG tablet Take 500 mg by mouth 2 (two) times daily.   Yes [provider]  amLODipine (NORVASC) 5 MG tablet TAKE 1 TABLET BY MOUTH EVERY DAY IN THE MORNING Patient taking differently: Take 5 mg by mouth daily. 10/01/20  Yes [place], Modena Nunnery, MD  aspirin EC  81 MG tablet Take 2 tablets (162 mg total) by mouth daily. Patient taking differently: Take 81 mg by mouth 2 (two) times daily. 12/26/16  Yes [place], Modena Nunnery, MD  atorvastatin (LIPITOR) 10 MG tablet TAKE 1 TABLET BY MOUTH EVERYDAY AT BEDTIME Patient taking differently: Take 10 mg by mouth daily. 10/01/20  Yes [place], Modena Nunnery, MD  ramipril (ALTACE) 10 MG capsule TAKE 1 CAPSULE BY MOUTH TWICE A DAY 07/30/20  Yes [place], Modena Nunnery, MD  triamterene-hydrochlorothiazide [facility]) 37.5-25 MG tablet TAKE 1 TABLET BY MOUTH EVERY DAY 08/15/20  Yes Alycia Rossetti, MD    Physical Exam: Vitals:   04/18/2022 1810 04/19/2022 1830 04/27/2022 1930 04/20/2022 2000  BP: (!) 192/74 (!) 184/125 (!) 162/121  (!) 162/92  Pulse: 60 77 75 73  Resp: '13 15 19 16  '$ Temp:      TempSrc:      SpO2: 97% 95% 95% 93%  Weight:      Height:        Physical Exam Vitals and nursing note reviewed.  Constitutional:      General: She is not in acute distress.    Appearance: She is well-developed and normal weight. She is ill-appearing.     Comments: Awake, does not talk much, follows commands  HENT:     Head: Normocephalic and atraumatic.     Mouth/Throat:     Mouth: Mucous membranes are moist.     Pharynx: Oropharynx is clear.  Eyes:     Extraocular Movements: Extraocular movements intact.     Pupils: Pupils are equal, round, and reactive to light.  Cardiovascular:     Rate and Rhythm: Regular rhythm. Bradycardia present.     Heart sounds: Normal heart sounds.  Pulmonary:     Effort: Pulmonary effort is normal. No respiratory distress.     Breath sounds: Normal breath sounds. No rales.  Abdominal:     General: Abdomen is flat. Bowel sounds are absent. There is no distension or abdominal bruit.     Palpations: Abdomen is soft. There is no hepatomegaly or mass.     Tenderness: There is generalized abdominal tenderness and tenderness in the left lower quadrant.     Hernia: No hernia is present.  Skin:    General: Skin is warm and dry.  Neurological:     General: No focal deficit present.     Mental Status: She is oriented to person, place, and time.  Psychiatric:        Mood and Affect: Mood normal.        Behavior: Behavior normal.     Comments: Generally withdrawn per daughter and doesn't talk much      Labs on Admission: I have personally reviewed following labs and imaging studies  CBC: Recent Labs  Lab 04/01/2022 1723  WBC 8.6  HGB 12.7  HCT 39.7  MCV 106.4*  PLT ACLMP   Basic Metabolic Panel: Recent Labs  Lab 04/09/2022 1723  NA 137  K 3.4*  CL 103  CO2 23  GLUCOSE 149*  BUN 21  CREATININE 0.84  CALCIUM 10.9*   GFR: Estimated Creatinine Clearance: 35.3 mL/min (by C-G  formula based on SCr of 0.84 mg/dL). Liver Function Tests: Recent Labs  Lab 04/17/2022 1723  AST 19  ALT 12  ALKPHOS 73  BILITOT 0.8  PROT 7.4  ALBUMIN 4.3   Recent Labs  Lab 04/06/2022 1723  LIPASE 24   No results for input(s): "AMMONIA" in the last 168 hours.  Coagulation Profile: No results for input(s): "INR", "PROTIME" in the last 168 hours. Cardiac Enzymes: No results for input(s): "CKTOTAL", "CKMB", "CKMBINDEX", "TROPONINI" in the last 168 hours. BNP (last 3 results) No results for input(s): "PROBNP" in the last 8760 hours. HbA1C: No results for input(s): "HGBA1C" in the last 72 hours. CBG: No results for input(s): "GLUCAP" in the last 168 hours. Lipid Profile: No results for input(s): "CHOL", "HDL", "LDLCALC", "TRIG", "CHOLHDL", "LDLDIRECT" in the last 72 hours. Thyroid Function Tests: No results for input(s): "TSH", "T4TOTAL", "FREET4", "T3FREE", "THYROIDAB" in the last 72 hours. Anemia Panel: No results for input(s): "VITAMINB12", "FOLATE", "FERRITIN", "TIBC", "IRON", "RETICCTPCT" in the last 72 hours. Urine analysis:    Component Value Date/Time   COLORURINE YELLOW 06/09/2021 1636   APPEARANCEUR CLEAR 06/09/2021 1636   LABSPEC 1.018 06/09/2021 1636   PHURINE 6.0 06/09/2021 1636   GLUCOSEU NEGATIVE 06/09/2021 1636   HGBUR NEGATIVE 06/09/2021 1636   BILIRUBINUR NEGATIVE 06/09/2021 1636   BILIRUBINUR negative 12/07/2012 1711   KETONESUR NEGATIVE 06/09/2021 1636   PROTEINUR NEGATIVE 06/09/2021 1636   UROBILINOGEN 0.2 12/07/2012 1711   UROBILINOGEN 0.2 11/17/2010 1353   NITRITE NEGATIVE 06/09/2021 1636   LEUKOCYTESUR NEGATIVE 06/09/2021 1636    Radiological Exams on Admission: I have personally reviewed images CT Abdomen Pelvis W Contrast  Result Date: 04/03/2022 CLINICAL DATA:  Nausea/vomiting Abdominal pain, acute, nonlocalized EXAM: CT ABDOMEN AND PELVIS WITH CONTRAST TECHNIQUE: Multidetector CT imaging of the abdomen and pelvis was performed using the  standard protocol following bolus administration of intravenous contrast. RADIATION DOSE REDUCTION: This exam was performed according to the departmental dose-optimization program which includes automated exposure control, adjustment of the mA and/or kV according to patient size and/or use of iterative reconstruction technique. CONTRAST:  65m OMNIPAQUE IOHEXOL 300 MG/ML  SOLN COMPARISON:  04/08/2020 FINDINGS: Lower chest: No acute abnormality Hepatobiliary: No focal hepatic abnormality. Gallbladder unremarkable. Pancreas: No focal abnormality or ductal dilatation. Spleen: No focal abnormality.  Normal size. Adrenals/Urinary Tract: Bilateral adrenal fullness, likely hyperplasia. Small cyst in the midpole of the right kidney measures 1.6 cm. This appears benign. No follow-up imaging recommended. No stones or hydronephrosis. Urinary bladder unremarkable. Stomach/Bowel: Stomach and large bowel unremarkable. Dilated small bowel loops into the pelvis. Distal small bowel decompressed. Findings compatible with small bowel obstruction. Exact transition point not visualized. Abnormal dilated small bowel loop noted in the pelvis with wall thickening and mesenteric edema. Vascular/Lymphatic: Aortic atherosclerosis. No evidence of aneurysm or adenopathy. Reproductive: Prior hysterectomy.  No adnexal masses. Other: Small amount of free fluid in the pelvis and adjacent to the liver. No free air. Musculoskeletal: No acute bony abnormality. Degenerative changes throughout the lumbar spine. IMPRESSION: Dilated small bowel into the pelvis with mid to distal small bowel decompressed. Findings compatible with small bowel obstruction. There is an abnormal dilated loop of small bowel in the pelvis with wall thickening and associated mesenteric edema. Cannot exclude close loop obstruction and bowel ischemia. No evidence of perforation. Aortic atherosclerosis. Electronically Signed   By: KRolm BaptiseM.D.   On: 04/27/2022 19:08    EKG: I  have personally reviewed EKG: Sinus bradycardia w/oacute change  Assessment/Plan Principal Problem:   SBO (small bowel obstruction) (HCC) Active Problems:   Essential hypertension, benign   Hyperlipidemia    Assessment and Plan: * SBO (small bowel obstruction) (HCC) Three day h/o abdominal pain with N/V. CT abd/pelvis reveals dilated loops bowel mid-distal small bowel extending into pelvis with compression. No transition point identified. Picture consistent with  SBO.  Plan Med-surg admit  General Surgery consult - Dr. Arnoldo Morale aware  NG placed for symptom control  NPO with sips,chips meds  Toradol 15 mg IV q 6 prn pain  Hyperlipidemia Will hold medications  Essential hypertension, benign BP elevated at admission, possibly pain driven. Patient given hydralazine in ED.  Plan Continue home meds except hold diuretics  IV fluid with K       DVT prophylaxis: SQ Heparin Code Status: DNR/DNI(Do NOT Intubate) Family Communication: daughter present during interview and exam Gave most of the history. Agrees to tx plan and code status  Disposition Plan: TBD  Consults called: GS - Dr. Arnoldo Morale  Admission status: Inpatient, Med-Surg   Adella Hare, MD [facility] 04/24/2022, 8:45 PM

## 2022-04-10 NOTE — Assessment & Plan Note (Signed)
Will hold medications

## 2022-04-10 NOTE — Assessment & Plan Note (Signed)
BP elevated at admission, possibly pain driven. Patient given hydralazine in ED.  Plan Continue home meds except hold diuretics  IV fluid with K

## 2022-04-10 NOTE — ED Notes (Signed)
Pt care taken, family at bedside, awaiting results of CT scan. No complaints at this time.

## 2022-04-10 NOTE — ED Notes (Signed)
Told house supervisor that I needed this medication

## 2022-04-10 NOTE — Assessment & Plan Note (Signed)
Three day h/o abdominal pain with N/V. CT abd/pelvis reveals dilated loops bowel mid-distal small bowel extending into pelvis with compression. No transition point identified. Picture consistent with SBO.  Plan Med-surg admit  General Surgery consult - Dr. Arnoldo Morale aware  NG placed for symptom control  NPO with sips,chips meds  Toradol 15 mg IV q 6 prn pain

## 2022-04-10 NOTE — ED Provider Notes (Signed)
Medical screening examination/treatment/procedure(s) were conducted as a shared visit with non-physician practitioner(s) and myself.  I personally evaluated the patient during the encounter.  Clinical Impression:   Final diagnoses:  None   Patient is a somewhat ill-appearing 88 year old female who has had multiple abdominal surgeries, the daughter at the bedside is unclear what surgeries they are, however she has been diagnosed with stomach ulcers, breast cancer and a vaginal hysterectomy.  On exam the patient has increasing abdominal distention but tympanitic sounds to percussion across the mid to upper abdomen, she has tenderness to palpation but is not peritoneal, she is not tachycardic or febrile.  The patient will need an NG tube, consultation with general surgery and admission to the hospital service.      Noemi Chapel, MD 04/13/22 [phone]

## 2022-04-10 NOTE — ED Provider Notes (Signed)
[facility] EMERGENCY DEPARTMENT Provider Note   CSN: [id number] Arrival date & time: 04/23/2022  1633     History  Chief Complaint  Patient presents with   Abdominal Pain    Dawn Church is a 88 y.o. female with history of dementia, essential HTN, hyperlipidemia, IDA, hypothyroidism, prior breast cancer LUQ.  Presenting today with abdominal pain over the last 3 days and new onset of vomiting as of this morning.  Daughter provides most of history given patient's dementia, and confirms patient appears close to baseline but more fatigued than normal.  Denies recent fevers, shortness of breath, recent upper respiratory infection, chest pain.  Patient's daughter states she occasionally grabs at her right abdomen, though states her left abdomen is tender.  Daughter states the pt tends to do this "when she doesn't want to do something she is told".  What concerned the daughter is when she began vomiting this morning.  Daughter routinely checks after pt uses toilet, and denies changes in urine output or bowel habits.  The history is provided by the patient and medical records.  Abdominal Pain      Home Medications Prior to Admission medications   Medication Sig Start Date End Date Taking? Authorizing Provider  Acetaminophen (APAP ARTHRITIS PO) Take 2 tablets by mouth as needed.    [provider]  amLODipine (NORVASC) 5 MG tablet TAKE 1 TABLET BY MOUTH EVERY DAY IN THE MORNING Patient taking differently: Take 5 mg by mouth daily. 10/01/20   Alycia Rossetti, MD  aspirin EC 81 MG tablet Take 2 tablets (162 mg total) by mouth daily. 12/26/16   [place], Modena Nunnery, MD  atorvastatin (LIPITOR) 10 MG tablet TAKE 1 TABLET BY MOUTH EVERYDAY AT BEDTIME Patient taking differently: Take 10 mg by mouth daily. 10/01/20   Alycia Rossetti, MD  omega-3 acid ethyl esters (LOVAZA) 1 g capsule TAKE 1 CAPSULE BY MOUTH TWICE A DAY 01/25/21   Susy Frizzle, MD  ramipril (ALTACE) 10 MG capsule TAKE 1  CAPSULE BY MOUTH TWICE A DAY 07/30/20   Alycia Rossetti, MD  triamterene-hydrochlorothiazide [facility]) 37.5-25 MG tablet TAKE 1 TABLET BY MOUTH EVERY DAY 08/15/20   [place], Modena Nunnery, MD      Allergies    Codeine and Ultram [tramadol]    Review of Systems   Review of Systems  Gastrointestinal:  Positive for abdominal pain.    Physical Exam Updated Vital Signs BP (!) 192/74   Pulse 60   Temp 98 F (36.7 C) (Oral)   Resp 13   Ht '5\' 3"'$  (1.6 m)   Wt 58.2 kg   LMP  (LMP Unknown)   SpO2 97%   BMI 22.73 kg/m  Physical Exam Vitals and nursing note reviewed.  Constitutional:      General: She is not in acute distress.    Appearance: She is well-developed. She is not toxic-appearing or diaphoretic.     Comments: Appears fatigued, somewhat ill-appearing  HENT:     Head: Normocephalic and atraumatic.  Eyes:     Conjunctiva/sclera: Conjunctivae normal.  Cardiovascular:     Rate and Rhythm: Normal rate and regular rhythm.     Heart sounds: No murmur heard. Pulmonary:     Effort: Pulmonary effort is normal. No respiratory distress.     Breath sounds: Normal breath sounds.  Chest:     Chest wall: No tenderness.  Abdominal:     General: Abdomen is protuberant. Bowel sounds are normal. There is  no distension.     Palpations: Abdomen is soft. There is no shifting dullness or fluid wave.     Tenderness: There is generalized abdominal tenderness (Mild, mainly umbilical). There is no right CVA tenderness, left CVA tenderness or guarding. Negative signs include Murphy's sign and McBurney's sign.  Musculoskeletal:        General: No swelling.     Cervical back: Neck supple.  Skin:    General: Skin is warm and dry.     Capillary Refill: Capillary refill takes less than 2 seconds.  Neurological:     Mental Status: She is alert.     GCS: GCS eye subscore is 4. GCS verbal subscore is 5. GCS motor subscore is 6.     Comments: Awake and alert.  Oriented to self.  Demented at baseline.   Psychiatric:        Mood and Affect: Mood normal.    ED Results / Procedures / Treatments   Labs (all labs ordered are listed, but only abnormal results are displayed) Labs Reviewed  COMPREHENSIVE METABOLIC PANEL - Abnormal; Notable for the following components:      Result Value   Potassium 3.4 (*)    Glucose, Bld 149 (*)    Calcium 10.9 (*)    All other components within normal limits  CBC - Abnormal; Notable for the following components:   RBC 3.73 (*)    MCV 106.4 (*)    All other components within normal limits  LIPASE, BLOOD  URINALYSIS, ROUTINE W REFLEX MICROSCOPIC    EKG EKG Interpretation  Date/Time:  Thursday April 10 2022 16:53:25 EDT Ventricular Rate:  53 PR Interval:  184 QRS Duration: 92 QT Interval:  418 QTC Calculation: 392 R Axis:   63 Text Interpretation: Sinus bradycardia Nonspecific T wave abnormality Abnormal ECG When compared with ECG of 09-Jun-2021 15:37, PREVIOUS ECG IS PRESENT since last tracing no significant change Confirmed by Noemi Chapel [phone]) on 04/18/2022 5:04:42 PM  Radiology CT Abdomen Pelvis W Contrast  Result Date: 04/05/2022 CLINICAL DATA:  Nausea/vomiting Abdominal pain, acute, nonlocalized EXAM: CT ABDOMEN AND PELVIS WITH CONTRAST TECHNIQUE: Multidetector CT imaging of the abdomen and pelvis was performed using the standard protocol following bolus administration of intravenous contrast. RADIATION DOSE REDUCTION: This exam was performed according to the departmental dose-optimization program which includes automated exposure control, adjustment of the mA and/or kV according to patient size and/or use of iterative reconstruction technique. CONTRAST:  47m OMNIPAQUE IOHEXOL 300 MG/ML  SOLN COMPARISON:  04/08/2020 FINDINGS: Lower chest: No acute abnormality Hepatobiliary: No focal hepatic abnormality. Gallbladder unremarkable. Pancreas: No focal abnormality or ductal dilatation. Spleen: No focal abnormality.  Normal size. Adrenals/Urinary  Tract: Bilateral adrenal fullness, likely hyperplasia. Small cyst in the midpole of the right kidney measures 1.6 cm. This appears benign. No follow-up imaging recommended. No stones or hydronephrosis. Urinary bladder unremarkable. Stomach/Bowel: Stomach and large bowel unremarkable. Dilated small bowel loops into the pelvis. Distal small bowel decompressed. Findings compatible with small bowel obstruction. Exact transition point not visualized. Abnormal dilated small bowel loop noted in the pelvis with wall thickening and mesenteric edema. Vascular/Lymphatic: Aortic atherosclerosis. No evidence of aneurysm or adenopathy. Reproductive: Prior hysterectomy.  No adnexal masses. Other: Small amount of free fluid in the pelvis and adjacent to the liver. No free air. Musculoskeletal: No acute bony abnormality. Degenerative changes throughout the lumbar spine. IMPRESSION: Dilated small bowel into the pelvis with mid to distal small bowel decompressed. Findings compatible with small bowel obstruction. There  is an abnormal dilated loop of small bowel in the pelvis with wall thickening and associated mesenteric edema. Cannot exclude close loop obstruction and bowel ischemia. No evidence of perforation. Aortic atherosclerosis. Electronically Signed   By: Rolm Baptise M.D.   On: 04/15/2022 19:08    Procedures Procedures    Medications Ordered in ED Medications  sodium chloride 0.9 % bolus 1,000 mL (1,000 mLs Intravenous New Bag/Given 04/07/2022 1817)  ondansetron (ZOFRAN) injection 4 mg (4 mg Intravenous Given 04/01/2022 1819)  iohexol (OMNIPAQUE) 300 MG/ML solution 80 mL (80 mLs Intravenous Contrast Given 04/15/2022 1843)    ED Course/ Medical Decision Making/ A&P                           Medical Decision Making Amount and/or Complexity of Data Reviewed Labs: ordered. Radiology: ordered.  Risk Prescription drug management. Decision regarding hospitalization.   88 y.o. female presents to the ED for concern of  Abdominal Pain     This involves an extensive number of treatment options, and is a complaint that carries with it a high risk of complications and morbidity.  The emergent differential diagnosis prior to evaluation includes, but is not limited to: bowel obstruction, bowel perforation, diverticulitis  This is not an exhaustive differential.   Past Medical History / Co-morbidities / Social History: Hx of dementia, essential HTN, hyperlipidemia, IDA, hypothyroidism, prior breast cancer LUQ, dementia Social Determinants of Health include: Elderly, dementia  Additional History:  None  Lab Tests: I ordered, and personally interpreted labs.  The pertinent results include:   CBC: MCV 106.4, RBC 3.73 which appear steady CMP/BMP: Potassium 3.4, Glucose 149, Calcium 10.9 close to baseline UA: Pending  Imaging Studies: I ordered imaging studies including CT abdomen and pelvis.   I independently visualized and interpreted imaging which showed SBO with associated mesenteric edema, no evidence perforation I agree with the radiologist interpretation.  Cardiac Monitoring: The patient was maintained on a cardiac monitor.  I personally viewed and interpreted the cardiac monitored which showed an underlying rhythm of: Sinus bradycardia and normal sinus   ED Course / Critical Interventions: Pt somewhat ill-appearing on exam.  Nonseptic nontoxic appearing in NAD.  Awake and alert, though mildly fatigued on exam.  Abdominal pain intermittent, varies in location, and has increased over last three days.  New onset of vomiting since this AM.  Vomiting is bilious or food content, without blood visualized.  One episode of vomiting appreciated on exam.  No flank pain.  Mild constipation at baseline, though seems to be more prolonged than normal per daughter.  No urinary changes.  Suspicious of bowel obstruction vs perforation vs diverticulitis.  Plan to proceed with fluid bolus and antiemetics, QT appears normal.   CT abdomen and pelvis pending. Upon reevaluation, pt with mild improvement of N/V, though still with intermittent abdominal tenderness.  CT imaging suggests SBO with associated mesenteric edema, however no evidence of perforation.  Consulted with Dr. Arnoldo Morale of gastroenterology, agrees with plan for admission and recommends placement of NG tube.  Plans to see pt in morning.  Consultation for hospitalist placed.  Noticed blood pressure climbing throughout encounter, pt has not been able to take her amlodipine due to N/V.  HR around 60bpm, hydralazine ordered. Consulted with Dr. Linda Hedges of hospitalist team, discussed above details.  Agreed with plan for admission.    Disposition: Admission   I discussed this case with my attending, Dr. Sabra Heck, who agreed with the  proposed treatment course and cosigned this note including patient's presenting symptoms, physical exam, and planned diagnostics and interventions.  Attending physician stated agreement with plan or made changes to plan which were implemented.     This chart was dictated using voice recognition software.  Despite best efforts to proofread, errors can occur which can change the documentation meaning..         Final Clinical Impression(s) / ED Diagnoses Final diagnoses:  Small bowel obstruction [facility])    Rx / DC Orders ED Discharge Orders     None         Candace Cruise 37/35/78 2041    Noemi Chapel, MD 04/13/22 [phone]

## 2022-04-10 NOTE — ED Triage Notes (Signed)
Pt BIB daughter for left-side abdominal pain with vomiting x3 days, per daughter maybe constipated.

## 2022-04-10 NOTE — ED Notes (Signed)
Attempted to place ng tube x 2 no success.

## 2022-04-10 NOTE — Subjective & Objective (Signed)
Mrs. Caloca, a 88 y/o followed for HTN, HLD, OA, hyperparathyroidism was in her usual health until 3 days PTA when she developed left-sided abdominal pain along with N/V. Due to persistent symptoms she presents to [facility] for evaluation.

## 2022-04-11 ENCOUNTER — Inpatient Hospital Stay (HOSPITAL_COMMUNITY): Payer: Medicare PPO

## 2022-04-11 DIAGNOSIS — K56609 Unspecified intestinal obstruction, unspecified as to partial versus complete obstruction: Secondary | ICD-10-CM | POA: Diagnosis not present

## 2022-04-11 LAB — BASIC METABOLIC PANEL
Anion gap: 11 (ref 5–15)
BUN: 18 mg/dL (ref 8–23)
CO2: 24 mmol/L (ref 22–32)
Calcium: 11.1 mg/dL — ABNORMAL HIGH (ref 8.9–10.3)
Chloride: 103 mmol/L (ref 98–111)
Creatinine, Ser: 0.7 mg/dL (ref 0.44–1.00)
GFR, Estimated: >60 mL/min (ref >60)
Glucose, Bld: 194 mg/dL — ABNORMAL HIGH (ref 70–99)
Potassium: 3.4 mmol/L — ABNORMAL LOW (ref 3.5–5.1)
Sodium: 138 mmol/L (ref 135–145)

## 2022-04-11 LAB — CBC
HCT: 42 % (ref 36.0–46.0)
Hemoglobin: 13.9 g/dL (ref 12.0–15.0)
MCH: 34.1 pg — ABNORMAL HIGH (ref 26.0–34.0)
MCHC: 33.1 g/dL (ref 30.0–36.0)
MCV: 102.9 fL — ABNORMAL HIGH (ref 80.0–100.0)
Platelets: 158 10*3/uL (ref 150–400)
RBC: 4.08 MIL/uL (ref 3.87–5.11)
RDW: 13.2 % (ref 11.5–15.5)
WBC: 11.3 10*3/uL — ABNORMAL HIGH (ref 4.0–10.5)
nRBC: 0 % (ref 0.0–0.2)

## 2022-04-11 MED ORDER — POTASSIUM CHLORIDE 10 MEQ/100ML IV SOLN
10.0000 meq | INTRAVENOUS | Status: AC
  Administered 2022-04-11 (×4): 10 meq via INTRAVENOUS
  Filled 2022-04-11 (×4): qty 100

## 2022-04-11 MED ORDER — SODIUM CHLORIDE 0.9 % IV SOLN: INTRAVENOUS | Status: DC | PRN

## 2022-04-11 MED ORDER — HALOPERIDOL LACTATE 5 MG/ML IJ SOLN
2.0000 mg | Freq: Four times a day (QID) | INTRAMUSCULAR | Status: DC | PRN
  Administered 2022-04-12 – 2022-04-13 (×2): 2 mg via INTRAVENOUS
  Filled 2022-04-11 (×2): qty 1

## 2022-04-11 MED ORDER — LACTATED RINGERS IV SOLN: INTRAVENOUS | Status: DC

## 2022-04-11 MED ORDER — ONDANSETRON HCL 4 MG/2ML IJ SOLN
4.0000 mg | Freq: Four times a day (QID) | INTRAMUSCULAR | Status: DC | PRN
  Administered 2022-04-14 (×3): 4 mg via INTRAVENOUS
  Filled 2022-04-11 (×3): qty 2

## 2022-04-11 NOTE — Progress Notes (Signed)
PROGRESS NOTE    Dawn Church  UXL:[id number] DOB: [birth year] DOA: 04/12/2022 PCP: Benita Stabile, MD   Brief Narrative:  Mrs. Grumbling, a 88 y/o followed for HTN, HLD, OA, hyperparathyroidism was in her usual health until 3 days PTA when she developed left-sided abdominal pain along with N/V.  She was admitted with partial small bowel obstruction and has been seen by general surgery with plans for conservative management.   Assessment & Plan:   Principal Problem:   SBO (small bowel obstruction) (HCC) Active Problems:   Essential hypertension, benign   Hyperlipidemia  Assessment and Plan:   Partial SBO (small bowel obstruction) (HCC) Three day h/o abdominal pain with N/V. CT abd/pelvis reveals dilated loops bowel mid-distal small bowel extending into pelvis with compression. No transition point identified. Picture consistent with SBO.   -Continue n.p.o. and IV fluid -Agree to general surgery recommendations -Zofran for nausea and vomiting  Hypokalemia  -Replete and reevaluate in a.m.   Hyperlipidemia Will hold medications   Essential hypertension, benign BP elevated at admission, possibly pain driven. Patient given hydralazine in ED.   Plan     Continue home meds as tolerated except hold diuretics             IV hydralazine ordered as needed  Dementia   -Noted to have some mild agitation, but daughter at bedside -Will order Haldol as needed   DVT prophylaxis: Heparin Code Status: DNR Family Communication: Daughter at bedside 8/11 Disposition Plan:  Status is: Inpatient Remains inpatient appropriate because: Need for ongoing IV fluid   Consultants:  General surgery  Procedures:  None  Antimicrobials:  None   Subjective: Patient seen and evaluated today with some mild agitation noted.  She continues to have nausea and vomiting this morning according to daughter at bedside.  Objective: Vitals:   04/11/22 0530 04/11/22 0629 04/11/22 0631 04/11/22 0745   BP: (!) 190/79  (!) 195/77 (!) 165/100  Pulse: 66  64   Resp: 16  17   Temp: 98.2 F (36.8 C)  97.7 F (36.5 C)   TempSrc:      SpO2: 98%  100%   Weight:  57.3 kg    Height:  5\' 3"  (1.6 m)      Intake/Output Summary (Last 24 hours) at 04/11/2022 0914 Last data filed at 04/11/2022 0500 Gross per 24 hour  Intake 0 ml  Output 0 ml  Net 0 ml   Filed Weights   04/30/2022 1643 04/11/22 0629  Weight: 58.2 kg 57.3 kg    Examination:  General exam: Appears calm and comfortable  Respiratory system: Clear to auscultation. Respiratory effort normal. Cardiovascular system: S1 & S2 heard, RRR.  Gastrointestinal system: Abdomen is soft Central nervous system: Alert and awake Extremities: No edema Skin: No significant lesions noted Psychiatry: Flat affect.    Data Reviewed: I have personally reviewed following labs and imaging studies  CBC: Recent Labs  Lab 05/01/2022 1723 04/11/22 0445  WBC 8.6 11.3*  HGB 12.7 13.9  HCT 39.7 42.0  MCV 106.4* 102.9*  PLT ACLMP 158   Basic Metabolic Panel: Recent Labs  Lab 04/13/2022 1723 04/11/22 0445  NA 137 138  K 3.4* 3.4*  CL 103 103  CO2 23 24  GLUCOSE 149* 194*  BUN 21 18  CREATININE 0.84 0.70  CALCIUM 10.9* 11.1*   GFR: Estimated Creatinine Clearance: 37.1 mL/min (by C-G formula based on SCr of 0.7 mg/dL). Liver Function Tests: Recent Labs  Lab 04/15/2022 1723  AST 19  ALT 12  ALKPHOS 73  BILITOT 0.8  PROT 7.4  ALBUMIN 4.3   Recent Labs  Lab April 18, 2022 1723  LIPASE 24   No results for input(s): "AMMONIA" in the last 168 hours. Coagulation Profile: No results for input(s): "INR", "PROTIME" in the last 168 hours. Cardiac Enzymes: No results for input(s): "CKTOTAL", "CKMB", "CKMBINDEX", "TROPONINI" in the last 168 hours. BNP (last 3 results) No results for input(s): "PROBNP" in the last 8760 hours. HbA1C: No results for input(s): "HGBA1C" in the last 72 hours. CBG: No results for input(s): "GLUCAP" in the last 168  hours. Lipid Profile: No results for input(s): "CHOL", "HDL", "LDLCALC", "TRIG", "CHOLHDL", "LDLDIRECT" in the last 72 hours. Thyroid Function Tests: No results for input(s): "TSH", "T4TOTAL", "FREET4", "T3FREE", "THYROIDAB" in the last 72 hours. Anemia Panel: No results for input(s): "VITAMINB12", "FOLATE", "FERRITIN", "TIBC", "IRON", "RETICCTPCT" in the last 72 hours. Sepsis Labs: No results for input(s): "PROCALCITON", "LATICACIDVEN" in the last 168 hours.  No results found for this or any previous visit (from the past 240 hour(s)).       Radiology Studies: CT Abdomen Pelvis W Contrast  Result Date: 04-18-22 CLINICAL DATA:  Nausea/vomiting Abdominal pain, acute, nonlocalized EXAM: CT ABDOMEN AND PELVIS WITH CONTRAST TECHNIQUE: Multidetector CT imaging of the abdomen and pelvis was performed using the standard protocol following bolus administration of intravenous contrast. RADIATION DOSE REDUCTION: This exam was performed according to the departmental dose-optimization program which includes automated exposure control, adjustment of the mA and/or kV according to patient size and/or use of iterative reconstruction technique. CONTRAST:  80mL OMNIPAQUE IOHEXOL 300 MG/ML  SOLN COMPARISON:  04/08/2020 FINDINGS: Lower chest: No acute abnormality Hepatobiliary: No focal hepatic abnormality. Gallbladder unremarkable. Pancreas: No focal abnormality or ductal dilatation. Spleen: No focal abnormality.  Normal size. Adrenals/Urinary Tract: Bilateral adrenal fullness, likely hyperplasia. Small cyst in the midpole of the right kidney measures 1.6 cm. This appears benign. No follow-up imaging recommended. No stones or hydronephrosis. Urinary bladder unremarkable. Stomach/Bowel: Stomach and large bowel unremarkable. Dilated small bowel loops into the pelvis. Distal small bowel decompressed. Findings compatible with small bowel obstruction. Exact transition point not visualized. Abnormal dilated small bowel  loop noted in the pelvis with wall thickening and mesenteric edema. Vascular/Lymphatic: Aortic atherosclerosis. No evidence of aneurysm or adenopathy. Reproductive: Prior hysterectomy.  No adnexal masses. Other: Small amount of free fluid in the pelvis and adjacent to the liver. No free air. Musculoskeletal: No acute bony abnormality. Degenerative changes throughout the lumbar spine. IMPRESSION: Dilated small bowel into the pelvis with mid to distal small bowel decompressed. Findings compatible with small bowel obstruction. There is an abnormal dilated loop of small bowel in the pelvis with wall thickening and associated mesenteric edema. Cannot exclude close loop obstruction and bowel ischemia. No evidence of perforation. Aortic atherosclerosis. Electronically Signed   By: Charlett Nose M.D.   On: 04/18/22 19:08        Scheduled Meds:  amLODipine  5 mg Oral Daily   diatrizoate meglumine-sodium  90 mL Per NG tube Once   heparin  5,000 Units Subcutaneous Q8H   hydrALAZINE  10 mg Intravenous Once   ramipril  10 mg Oral BID   Continuous Infusions:  sodium chloride 10 mL/hr at 04/11/22 0846   lactated ringers     potassium chloride 10 mEq (04/11/22 0848)     LOS: 1 day    Time spent: 35 minutes    Heriberto Stmartin Hoover Brunette, DO [facility]  If  7PM-7AM, please contact night-coverage [email or web address] 04/11/2022, 9:14 AM

## 2022-04-11 NOTE — Consult Note (Signed)
Reason for Consult: Small bowel obstruction Referring Physician: Dr. [name] is an 88 y.o. female.  HPI: Patient is a 88 year old black female with multiple comorbidities including history of CVA, hypertension, and dementia who is taking care of by family members at home.  She developed left-sided abdominal pain along with nausea and vomiting over the past 3 days.  She presented to the emergency room for further evaluation and treatment.  The CT scan of the abdomen was performed which revealed a small bowel obstruction with a question of a closed-loop obstruction lower in the pelvis.  An NG tube could not be placed.  The patient was admitted to the hospital for further evaluation and treatment.  Patient is DNR. One of the daughters at stay with her overnight said she slept comfortably and does not seem to be complaining of much pain while lying in the bed.  She has been passing gas.  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   CVA (cerebral infarction)    Epigastric pain    GERD (gastroesophageal reflux disease)    Hemochromatosis    Hx of seasonal allergies    Hypercholesteremia    Hypertension    Prolonged QT interval    Stomach ulcer    Stroke ([place]) 2015   TIA (transient ischemic attack)     Past Surgical History:  Procedure Laterality Date   APPENDECTOMY     BREAST BIOPSY     Bilateral   BREAST BIOPSY     bilateral   BREAST LUMPECTOMY WITH RADIOACTIVE SEED LOCALIZATION Left 08/19/2016   Procedure: LEFT BREAST LUMPECTOMY WITH RADIOACTIVE SEED LOCALIZATION;  Surgeon: Excell Seltzer, MD;  Location: [facility];  Service: General;  Laterality: Left;   CATARACT EXTRACTION, BILATERAL     ESOPHAGOGASTRODUODENOSCOPY  12/20/2010   SHOULDER OPEN ROTATOR CUFF REPAIR     right    VAGINAL HYSTERECTOMY      Family History  Problem Relation Age of Onset   Diabetes Other    Cancer  Other    Cancer Daughter    Colon cancer Neg Hx     Social History:  reports that she has never smoked. She has never used smokeless tobacco. She reports that she does not drink alcohol and does not use drugs.  Allergies:  Allergies  Allergen Reactions   Codeine Other (See Comments)    Caused patient to "space out"   Ultram [Tramadol]     nausea    Medications: I have reviewed the patient's current medications. Prior to Admission:  Medications Prior to Admission  Medication Sig Dispense Refill Last Dose   acetaminophen (TYLENOL) 500 MG tablet Take 500 mg by mouth 2 (two) times daily.   04/07/2022   amLODipine (NORVASC) 5 MG tablet TAKE 1 TABLET BY MOUTH EVERY DAY IN THE MORNING (Patient taking differently: Take 5 mg by mouth daily.) 90 tablet 2 04/26/2022   aspirin EC 81 MG tablet Take 2 tablets (162 mg total) by mouth daily. (Patient taking differently: Take 81 mg by mouth 2 (two) times daily.)   04/02/2022   atorvastatin (LIPITOR) 10 MG tablet TAKE 1 TABLET BY MOUTH EVERYDAY AT BEDTIME (Patient taking differently: Take 10 mg by mouth daily.) 90 tablet 2 04/09/2022   ramipril (ALTACE) 10 MG capsule TAKE 1 CAPSULE BY MOUTH TWICE A DAY 180 capsule 2 04/15/2022   triamterene-hydrochlorothiazide (MAXZIDE-25) 37.5-25  MG tablet TAKE 1 TABLET BY MOUTH EVERY DAY 90 tablet 1 04/13/2022    Results for orders placed or performed during the hospital encounter of 04/24/2022 (from the past 48 hour(s))  Lipase, blood     Status: None   Collection Time: 04/07/2022  5:23 PM  Result Value Ref Range   Lipase 24 11 - 51 U/L    Comment: Performed at [facility], [address], [place], [place] [address]  Comprehensive metabolic panel     Status: Abnormal   Collection Time: 04/02/2022  5:23 PM  Result Value Ref Range   Sodium 137 135 - 145 mmol/L   Potassium 3.4 (L) 3.5 - 5.1 mmol/L   Chloride 103 98 - 111 mmol/L   CO2 23 22 - 32 mmol/L   Glucose, Bld 149 (H) 70 - 99 mg/dL    Comment: Glucose reference  range applies only to samples taken after fasting for at least 8 hours.   BUN 21 8 - 23 mg/dL   Creatinine, Ser 0.84 0.44 - 1.00 mg/dL   Calcium 10.9 (H) 8.9 - 10.3 mg/dL   Total Protein 7.4 6.5 - 8.1 g/dL   Albumin 4.3 3.5 - 5.0 g/dL   AST 19 15 - 41 U/L   ALT 12 0 - 44 U/L   Alkaline Phosphatase 73 38 - 126 U/L   Total Bilirubin 0.8 0.3 - 1.2 mg/dL   GFR, Estimated >60 >60 mL/min    Comment: (NOTE) Calculated using the CKD-EPI Creatinine Equation (2021)    Anion gap 11 5 - 15    Comment: Performed at [facility], [address], [place], [place] [address]  CBC     Status: Abnormal   Collection Time: 04/29/2022  5:23 PM  Result Value Ref Range   WBC 8.6 4.0 - 10.5 K/uL   RBC 3.73 (L) 3.87 - 5.11 MIL/uL   Hemoglobin 12.7 12.0 - 15.0 g/dL   HCT 39.7 36.0 - 46.0 %   MCV 106.4 (H) 80.0 - 100.0 fL   MCH 34.0 26.0 - 34.0 pg   MCHC 32.0 30.0 - 36.0 g/dL   RDW 13.2 11.5 - 15.5 %   Platelets ACLMP 150 - 400 K/uL    Comment: SPECIMEN CHECKED FOR CLOTS Immature Platelet Fraction may be clinically indicated, consider ordering this additional test [id number] PLATELET CLUMPS NOTED ON SMEAR, UNABLE TO ESTIMATE PLATELETS APPEAR ADEQUATE    nRBC 0.0 0.0 - 0.2 %    Comment: Performed at [facility], [address], [place], [place] [address]  Urinalysis, Routine w reflex microscopic Urine, In & Out Cath     Status: Abnormal   Collection Time: 04/30/2022  9:07 PM  Result Value Ref Range   Color, Urine COLORLESS (A) YELLOW   APPearance CLEAR CLEAR   Specific Gravity, Urine 1.016 1.005 - 1.030   pH 7.0 5.0 - 8.0   Glucose, UA 50 (A) NEGATIVE mg/dL   Hgb urine dipstick NEGATIVE NEGATIVE   Bilirubin Urine NEGATIVE NEGATIVE   Ketones, ur 5 (A) NEGATIVE mg/dL   Protein, ur NEGATIVE NEGATIVE mg/dL   Nitrite NEGATIVE NEGATIVE   Leukocytes,Ua NEGATIVE NEGATIVE    Comment: Performed at [facility], [address], [place], [place] [address]  Basic metabolic panel     Status: Abnormal   Collection  Time: 04/11/22  4:45 AM  Result Value Ref Range   Sodium 138 135 - 145 mmol/L   Potassium 3.4 (L) 3.5 - 5.1 mmol/L   Chloride 103 98 -  111 mmol/L   CO2 24 22 - 32 mmol/L   Glucose, Bld 194 (H) 70 - 99 mg/dL    Comment: Glucose reference range applies only to samples taken after fasting for at least 8 hours.   BUN 18 8 - 23 mg/dL   Creatinine, Ser 0.70 0.44 - 1.00 mg/dL   Calcium 11.1 (H) 8.9 - 10.3 mg/dL   GFR, Estimated >60 >60 mL/min    Comment: (NOTE) Calculated using the CKD-EPI Creatinine Equation (2021)    Anion gap 11 5 - 15    Comment: Performed at [facility], [address] [place], [place] [address]  CBC     Status: Abnormal   Collection Time: 04/11/22  4:45 AM  Result Value Ref Range   WBC 11.3 (H) 4.0 - 10.5 K/uL   RBC 4.08 3.87 - 5.11 MIL/uL   Hemoglobin 13.9 12.0 - 15.0 g/dL   HCT 42.0 36.0 - 46.0 %   MCV 102.9 (H) 80.0 - 100.0 fL   MCH 34.1 (H) 26.0 - 34.0 pg   MCHC 33.1 30.0 - 36.0 g/dL   RDW 13.2 11.5 - 15.5 %   Platelets 158 150 - 400 K/uL   nRBC 0.0 0.0 - 0.2 %    Comment: Performed at [facility], [address] [place], [place] [address]    CT Abdomen Pelvis W Contrast  Result Date: 04/26/2022 CLINICAL DATA:  Nausea/vomiting Abdominal pain, acute, nonlocalized EXAM: CT ABDOMEN AND PELVIS WITH CONTRAST TECHNIQUE: Multidetector CT imaging of the abdomen and pelvis was performed using the standard protocol following bolus administration of intravenous contrast. RADIATION DOSE REDUCTION: This exam was performed according to the departmental dose-optimization program which includes automated exposure control, adjustment of the mA and/or kV according to patient size and/or use of iterative reconstruction technique. CONTRAST:  72m OMNIPAQUE IOHEXOL 300 MG/ML  SOLN COMPARISON:  04/08/2020 FINDINGS: Lower chest: No acute abnormality Hepatobiliary: No focal hepatic abnormality. Gallbladder unremarkable. Pancreas: No focal abnormality or ductal dilatation. Spleen: No  focal abnormality.  Normal size. Adrenals/Urinary Tract: Bilateral adrenal fullness, likely hyperplasia. Small cyst in the midpole of the right kidney measures 1.6 cm. This appears benign. No follow-up imaging recommended. No stones or hydronephrosis. Urinary bladder unremarkable. Stomach/Bowel: Stomach and large bowel unremarkable. Dilated small bowel loops into the pelvis. Distal small bowel decompressed. Findings compatible with small bowel obstruction. Exact transition point not visualized. Abnormal dilated small bowel loop noted in the pelvis with wall thickening and mesenteric edema. Vascular/Lymphatic: Aortic atherosclerosis. No evidence of aneurysm or adenopathy. Reproductive: Prior hysterectomy.  No adnexal masses. Other: Small amount of free fluid in the pelvis and adjacent to the liver. No free air. Musculoskeletal: No acute bony abnormality. Degenerative changes throughout the lumbar spine. IMPRESSION: Dilated small bowel into the pelvis with mid to distal small bowel decompressed. Findings compatible with small bowel obstruction. There is an abnormal dilated loop of small bowel in the pelvis with wall thickening and associated mesenteric edema. Cannot exclude close loop obstruction and bowel ischemia. No evidence of perforation. Aortic atherosclerosis. Electronically Signed   By: KRolm BaptiseM.D.   On: 04/29/2022 19:08    ROS:  Review of systems not obtained due to patient factors.  Blood pressure (!) 195/77, pulse 64, temperature 97.7 F (36.5 C), resp. rate 17, height '5\' 3"'$  (1.6 m), weight 57.3 kg, SpO2 100 %. Physical Exam: Comfortable black female in no acute distress, did not talk Head is normocephalic, atraumatic Lungs clear to auscultation with equal breath sounds  bilaterally Heart examination reveals a regular rate and rhythm Abdomen is soft with only discomfort to deep palpation in the lower abdomen.  No rigidity is noted.  Occasional bowel sounds are appreciated. CT scan images  personally reviewed Assessment/Plan: Impression: Partial small bowel obstruction most likely secondary to adhesions, though no transition zone is seen.  There is a loop of bowel with some mesenteric edema but no pneumatosis.  At this point, she does not have an acute abdomen requiring immediate surgery, though I am concerned about the possibility of needing surgery for the bowel obstruction.  I did discuss this with the 1 daughter.  She states they are about 8 children but four are intimately involved with her care.  I told her that surgery is risky due to her comorbidities and age.  She states that her mother would not want surgical intervention, but she wants to discuss this with several other siblings.  The risk of surgery including bleeding, infection, cardiopulmonary difficulties, postoperative ventilatory requirements, and death were fully explained to to her as the patient suffers from dementia.  Will follow with you.  Dawn Church 04/11/2022, 7:28 AM

## 2022-04-11 NOTE — Progress Notes (Signed)
Alert to self this morning and mostly slept but is now more awake sitting up and trying to get out of bed.  Daughter, Elnoria Howard , has been at bedside and then son came and is now at bedside.  Has not vomited today and potassium running at this time.  Fall mats put in place and son will be here until another family member arrives.

## 2022-04-11 NOTE — Care Management Important Message (Signed)
Important Message  Patient Details  Name: Dawn Church MRN: [id number] Date of Birth: 07/10/1928   Medicare Important Message Given:  Yes     [name] 04/11/2022, 11:30 AM

## 2022-04-11 NOTE — Progress Notes (Signed)
  Transition of Care [facility]) Screening Note   Patient Details  Name: Dawn Church Date of Birth: 1928-07-04   Transition of Care [facility]) CM/SW Contact:    Iona Beard, [place] Phone Number: 04/11/2022, 10:31 AM    Transition of Care Department [facility]) has reviewed patient and no TOC needs have been identified at this time. We will continue to monitor patient advancement through interdisciplinary progression rounds. If new patient transition needs arise, please place a TOC consult.

## 2022-04-12 ENCOUNTER — Inpatient Hospital Stay (HOSPITAL_COMMUNITY): Payer: Medicare PPO

## 2022-04-12 DIAGNOSIS — K56609 Unspecified intestinal obstruction, unspecified as to partial versus complete obstruction: Secondary | ICD-10-CM | POA: Diagnosis not present

## 2022-04-12 LAB — BASIC METABOLIC PANEL
Anion gap: 10 (ref 5–15)
BUN: 28 mg/dL — ABNORMAL HIGH (ref 8–23)
CO2: 24 mmol/L (ref 22–32)
Calcium: 10.7 mg/dL — ABNORMAL HIGH (ref 8.9–10.3)
Chloride: 103 mmol/L (ref 98–111)
Creatinine, Ser: 0.85 mg/dL (ref 0.44–1.00)
GFR, Estimated: >60 mL/min (ref >60)
Glucose, Bld: 136 mg/dL — ABNORMAL HIGH (ref 70–99)
Potassium: 3.8 mmol/L (ref 3.5–5.1)
Sodium: 137 mmol/L (ref 135–145)

## 2022-04-12 LAB — CBC
HCT: 41.4 % (ref 36.0–46.0)
Hemoglobin: 13.8 g/dL (ref 12.0–15.0)
MCH: 33.8 pg (ref 26.0–34.0)
MCHC: 33.3 g/dL (ref 30.0–36.0)
MCV: 101.5 fL — ABNORMAL HIGH (ref 80.0–100.0)
Platelets: 160 10*3/uL (ref 150–400)
RBC: 4.08 MIL/uL (ref 3.87–5.11)
RDW: 13.5 % (ref 11.5–15.5)
WBC: 14.2 10*3/uL — ABNORMAL HIGH (ref 4.0–10.5)
nRBC: 0 % (ref 0.0–0.2)

## 2022-04-12 LAB — MAGNESIUM: Magnesium: 1.7 mg/dL (ref 1.7–2.4)

## 2022-04-12 MED ORDER — DIATRIZOATE MEGLUMINE & SODIUM 66-10 % PO SOLN
ORAL | Status: AC
  Administered 2022-04-12: 90 mL
  Filled 2022-04-12: qty 90

## 2022-04-12 MED ORDER — GUAIFENESIN-DM 100-10 MG/5ML PO SYRP
5.0000 mL | ORAL_SOLUTION | ORAL | Status: DC | PRN
  Administered 2022-04-12: 5 mL via ORAL
  Filled 2022-04-12: qty 5

## 2022-04-12 MED ORDER — HYDRALAZINE HCL 20 MG/ML IJ SOLN
10.0000 mg | INTRAMUSCULAR | Status: DC | PRN
Start: 2022-04-12 — End: 2022-04-15
  Filled 2022-04-12: qty 1

## 2022-04-12 NOTE — Progress Notes (Signed)
PROGRESS NOTE    Dawn Church  ZOX:[id number] DOB: 09-29-1927 DOA: 04/01/2022 PCP: Benita Stabile, MD   Brief Narrative:   Dawn Church, a 88 y/o followed for HTN, HLD, OA, hyperparathyroidism was in her usual health until 3 days PTA when she developed left-sided abdominal pain along with N/V.  She was admitted with partial small bowel obstruction and has been seen by general surgery with plans for conservative management.  Assessment & Plan:   Principal Problem:   SBO (small bowel obstruction) (HCC) Active Problems:   Essential hypertension, benign   Hyperlipidemia  Assessment and Plan:  Partial SBO (small bowel obstruction) (HCC) Three day h/o abdominal pain with N/V. CT abd/pelvis reveals dilated loops bowel mid-distal small bowel extending into pelvis with compression. No transition point identified. Picture consistent with SBO.   -Continue n.p.o. and IV fluid -Zofran for nausea and vomiting -Further imaging studies per general surgery pending, no plans for operative intervention and family members agree     Hyperlipidemia Will hold medications   Essential hypertension, benign BP elevated at admission, possibly pain driven. Patient given hydralazine in ED.   Plan     Continue home meds as tolerated except hold diuretics             IV hydralazine ordered as needed   Dementia   -Noted to have some mild agitation, but daughter at bedside -Will order Haldol as needed    DVT prophylaxis: Heparin Code Status: DNR Family Communication: Daughter at bedside 8/12 Disposition Plan:  Status is: Inpatient Remains inpatient appropriate because: Need for ongoing IV fluid     Consultants:  General surgery   Procedures:  None   Antimicrobials:  None     Subjective: Patient seen and evaluated today and appears to be resting comfortably this AM.  She did require Haldol overnight for some mild agitation.  No significant nausea or vomiting noted, nor has she had bowel  movements according to daughter at bedside.  Objective: Vitals:   04/11/22 1635 04/11/22 2142 04/12/22 0537 04/12/22 0858  BP: (!) 153/71 (!) 178/90 (!) 193/85 (!) 189/82  Pulse: 77 81 72 73  Resp:  18 14 17   Temp:  98.5 F (36.9 C) 98.2 F (36.8 C) 98.8 F (37.1 C)  TempSrc:      SpO2:  93% 98% 99%  Weight:      Height:        Intake/Output Summary (Last 24 hours) at 04/12/2022 1130 Last data filed at 04/12/2022 0900 Gross per 24 hour  Intake 509.96 ml  Output 500 ml  Net 9.96 ml   Filed Weights   04/01/2022 1643 04/11/22 0629  Weight: 58.2 kg 57.3 kg    Examination:  General exam: Appears calm and comfortable  Respiratory system: Clear to auscultation. Respiratory effort normal. Cardiovascular system: S1 & S2 heard, RRR.  Gastrointestinal system: Abdomen is soft Central nervous system: Alert and awake Extremities: No edema Skin: No significant lesions noted Psychiatry: Flat affect.    Data Reviewed: I have personally reviewed following labs and imaging studies  CBC: Recent Labs  Lab 04/15/2022 1723 04/11/22 0445 04/12/22 0657  WBC 8.6 11.3* 14.2*  HGB 12.7 13.9 13.8  HCT 39.7 42.0 41.4  MCV 106.4* 102.9* 101.5*  PLT ACLMP 158 160   Basic Metabolic Panel: Recent Labs  Lab 04/20/2022 1723 04/11/22 0445 04/12/22 0657  NA 137 138 137  K 3.4* 3.4* 3.8  CL 103 103 103  CO2 23 24 24  GLUCOSE 149* 194* 136*  BUN 21 18 28*  CREATININE 0.84 0.70 0.85  CALCIUM 10.9* 11.1* 10.7*  MG  --   --  1.7   GFR: Estimated Creatinine Clearance: 34.9 mL/min (by C-G formula based on SCr of 0.85 mg/dL). Liver Function Tests: Recent Labs  Lab 23-Apr-2022 1723  AST 19  ALT 12  ALKPHOS 73  BILITOT 0.8  PROT 7.4  ALBUMIN 4.3   Recent Labs  Lab 04/23/2022 1723  LIPASE 24   No results for input(s): "AMMONIA" in the last 168 hours. Coagulation Profile: No results for input(s): "INR", "PROTIME" in the last 168 hours. Cardiac Enzymes: No results for input(s):  "CKTOTAL", "CKMB", "CKMBINDEX", "TROPONINI" in the last 168 hours. BNP (last 3 results) No results for input(s): "PROBNP" in the last 8760 hours. HbA1C: No results for input(s): "HGBA1C" in the last 72 hours. CBG: No results for input(s): "GLUCAP" in the last 168 hours. Lipid Profile: No results for input(s): "CHOL", "HDL", "LDLCALC", "TRIG", "CHOLHDL", "LDLDIRECT" in the last 72 hours. Thyroid Function Tests: No results for input(s): "TSH", "T4TOTAL", "FREET4", "T3FREE", "THYROIDAB" in the last 72 hours. Anemia Panel: No results for input(s): "VITAMINB12", "FOLATE", "FERRITIN", "TIBC", "IRON", "RETICCTPCT" in the last 72 hours. Sepsis Labs: No results for input(s): "PROCALCITON", "LATICACIDVEN" in the last 168 hours.  No results found for this or any previous visit (from the past 240 hour(s)).       Radiology Studies: CT Abdomen Pelvis W Contrast  Result Date: April 23, 2022 CLINICAL DATA:  Nausea/vomiting Abdominal pain, acute, nonlocalized EXAM: CT ABDOMEN AND PELVIS WITH CONTRAST TECHNIQUE: Multidetector CT imaging of the abdomen and pelvis was performed using the standard protocol following bolus administration of intravenous contrast. RADIATION DOSE REDUCTION: This exam was performed according to the departmental dose-optimization program which includes automated exposure control, adjustment of the mA and/or kV according to patient size and/or use of iterative reconstruction technique. CONTRAST:  80mL OMNIPAQUE IOHEXOL 300 MG/ML  SOLN COMPARISON:  04/08/2020 FINDINGS: Lower chest: No acute abnormality Hepatobiliary: No focal hepatic abnormality. Gallbladder unremarkable. Pancreas: No focal abnormality or ductal dilatation. Spleen: No focal abnormality.  Normal size. Adrenals/Urinary Tract: Bilateral adrenal fullness, likely hyperplasia. Small cyst in the midpole of the right kidney measures 1.6 cm. This appears benign. No follow-up imaging recommended. No stones or hydronephrosis. Urinary  bladder unremarkable. Stomach/Bowel: Stomach and large bowel unremarkable. Dilated small bowel loops into the pelvis. Distal small bowel decompressed. Findings compatible with small bowel obstruction. Exact transition point not visualized. Abnormal dilated small bowel loop noted in the pelvis with wall thickening and mesenteric edema. Vascular/Lymphatic: Aortic atherosclerosis. No evidence of aneurysm or adenopathy. Reproductive: Prior hysterectomy.  No adnexal masses. Other: Small amount of free fluid in the pelvis and adjacent to the liver. No free air. Musculoskeletal: No acute bony abnormality. Degenerative changes throughout the lumbar spine. IMPRESSION: Dilated small bowel into the pelvis with mid to distal small bowel decompressed. Findings compatible with small bowel obstruction. There is an abnormal dilated loop of small bowel in the pelvis with wall thickening and associated mesenteric edema. Cannot exclude close loop obstruction and bowel ischemia. No evidence of perforation. Aortic atherosclerosis. Electronically Signed   By: Charlett Nose M.D.   On: 2022-04-23 19:08        Scheduled Meds:  amLODipine  5 mg Oral Daily   diatrizoate meglumine-sodium  90 mL Per NG tube Once   diatrizoate meglumine-sodium       heparin  5,000 Units Subcutaneous Q8H   hydrALAZINE  10 mg Intravenous Once   ramipril  10 mg Oral BID   Continuous Infusions:  sodium chloride 10 mL/hr at 04/11/22 0846   lactated ringers 75 mL/hr at 04/12/22 0530     LOS: 2 days    Time spent: 35 minutes    Caedon Bond Hoover Brunette, DO [facility]  If 7PM-7AM, please contact night-coverage [email or web address] 04/12/2022, 11:30 AM

## 2022-04-12 NOTE — Progress Notes (Signed)
Subjective: Patient not communicative.  Resting comfortably in the bed.  Objective: Vital signs in last 24 hours: Temp:  [98.2 F (36.8 C)-98.8 F (37.1 C)] 98.8 F (37.1 C) (08/12 0858) Pulse Rate:  [70-81] 73 (08/12 0858) Resp:  [14-18] 17 (08/12 0858) BP: (153-198)/(71-90) 189/82 (08/12 0858) SpO2:  [93 %-99 %] 99 % (08/12 0858) Last BM Date : 04/09/22  Intake/Output from previous day: 08/11 0701 - 08/12 0700 In: 510 [I.V.:111.4; IV Piggyback:398.6] Out: 500 [Urine:500] Intake/Output this shift: No intake/output data recorded.  General appearance: cooperative and no distress GI: soft, non-tender; bowel sounds normal; no masses,  no organomegaly  Lab Results:  Recent Labs    04/11/22 0445 04/12/22 0657  WBC 11.3* 14.2*  HGB 13.9 13.8  HCT 42.0 41.4  PLT 158 160   BMET Recent Labs    04/11/22 0445 04/12/22 0657  NA 138 137  K 3.4* 3.8  CL 103 103  CO2 24 24  GLUCOSE 194* 136*  BUN 18 28*  CREATININE 0.70 0.85  CALCIUM 11.1* 10.7*   PT/INR No results for input(s): "LABPROT", "INR" in the last 72 hours.  Studies/Results: CT Abdomen Pelvis W Contrast  Result Date: 04/29/2022 CLINICAL DATA:  Nausea/vomiting Abdominal pain, acute, nonlocalized EXAM: CT ABDOMEN AND PELVIS WITH CONTRAST TECHNIQUE: Multidetector CT imaging of the abdomen and pelvis was performed using the standard protocol following bolus administration of intravenous contrast. RADIATION DOSE REDUCTION: This exam was performed according to the departmental dose-optimization program which includes automated exposure control, adjustment of the mA and/or kV according to patient size and/or use of iterative reconstruction technique. CONTRAST:  21m OMNIPAQUE IOHEXOL 300 MG/ML  SOLN COMPARISON:  04/08/2020 FINDINGS: Lower chest: No acute abnormality Hepatobiliary: No focal hepatic abnormality. Gallbladder unremarkable. Pancreas: No focal abnormality or ductal dilatation. Spleen: No focal abnormality.   Normal size. Adrenals/Urinary Tract: Bilateral adrenal fullness, likely hyperplasia. Small cyst in the midpole of the right kidney measures 1.6 cm. This appears benign. No follow-up imaging recommended. No stones or hydronephrosis. Urinary bladder unremarkable. Stomach/Bowel: Stomach and large bowel unremarkable. Dilated small bowel loops into the pelvis. Distal small bowel decompressed. Findings compatible with small bowel obstruction. Exact transition point not visualized. Abnormal dilated small bowel loop noted in the pelvis with wall thickening and mesenteric edema. Vascular/Lymphatic: Aortic atherosclerosis. No evidence of aneurysm or adenopathy. Reproductive: Prior hysterectomy.  No adnexal masses. Other: Small amount of free fluid in the pelvis and adjacent to the liver. No free air. Musculoskeletal: No acute bony abnormality. Degenerative changes throughout the lumbar spine. IMPRESSION: Dilated small bowel into the pelvis with mid to distal small bowel decompressed. Findings compatible with small bowel obstruction. There is an abnormal dilated loop of small bowel in the pelvis with wall thickening and associated mesenteric edema. Cannot exclude close loop obstruction and bowel ischemia. No evidence of perforation. Aortic atherosclerosis. Electronically Signed   By: KRolm BaptiseM.D.   On: 04/24/2022 19:08    Anti-infectives: Anti-infectives (From admission, onward)    None       Assessment/Plan: Impression: Partial small bowel obstruction.  Her leukocytosis is slightly elevated, but no other abdominal findings are present for ischemia of the bowel.  Has not had a bowel movement yet.  No emesis while in the hospital. Plan: Family has decided that they want no surgical intervention.  They do understand that she could pass away without surgical intervention, but they feel that this is what their mother wants.  I have added that to the DNR  status.  We will follow expectantly with you.  LOS: 2 days     [name] 04/12/2022

## 2022-04-13 ENCOUNTER — Inpatient Hospital Stay (HOSPITAL_COMMUNITY): Payer: Medicare PPO

## 2022-04-13 DIAGNOSIS — K56609 Unspecified intestinal obstruction, unspecified as to partial versus complete obstruction: Secondary | ICD-10-CM | POA: Diagnosis not present

## 2022-04-13 LAB — CBC
HCT: 39.1 % (ref 36.0–46.0)
Hemoglobin: 13 g/dL (ref 12.0–15.0)
MCH: 34.1 pg — ABNORMAL HIGH (ref 26.0–34.0)
MCHC: 33.2 g/dL (ref 30.0–36.0)
MCV: 102.6 fL — ABNORMAL HIGH (ref 80.0–100.0)
Platelets: 145 10*3/uL — ABNORMAL LOW (ref 150–400)
RBC: 3.81 MIL/uL — ABNORMAL LOW (ref 3.87–5.11)
RDW: 13.5 % (ref 11.5–15.5)
WBC: 13.9 10*3/uL — ABNORMAL HIGH (ref 4.0–10.5)
nRBC: 0 % (ref 0.0–0.2)

## 2022-04-13 LAB — BASIC METABOLIC PANEL
Anion gap: 9 (ref 5–15)
BUN: 33 mg/dL — ABNORMAL HIGH (ref 8–23)
CO2: 25 mmol/L (ref 22–32)
Calcium: 10.8 mg/dL — ABNORMAL HIGH (ref 8.9–10.3)
Chloride: 102 mmol/L (ref 98–111)
Creatinine, Ser: 0.9 mg/dL (ref 0.44–1.00)
GFR, Estimated: 60 mL/min — ABNORMAL LOW (ref >60)
Glucose, Bld: 142 mg/dL — ABNORMAL HIGH (ref 70–99)
Potassium: 3.9 mmol/L (ref 3.5–5.1)
Sodium: 136 mmol/L (ref 135–145)

## 2022-04-13 LAB — MAGNESIUM: Magnesium: 2 mg/dL (ref 1.7–2.4)

## 2022-04-13 MED ORDER — POLYETHYLENE GLYCOL 3350 17 G PO PACK
17.0000 g | PACK | Freq: Every day | ORAL | Status: DC
  Administered 2022-04-13 – 2022-04-14 (×2): 17 g via ORAL
  Filled 2022-04-13 (×2): qty 1

## 2022-04-13 MED ORDER — BISACODYL 10 MG RE SUPP
10.0000 mg | Freq: Once | RECTAL | Status: AC
  Administered 2022-04-13: 10 mg via RECTAL
  Filled 2022-04-13: qty 1

## 2022-04-13 NOTE — Progress Notes (Signed)
PROGRESS NOTE    Dawn Church  PPI:[id number] DOB: 01-Oct-1927 DOA: 13-Apr-2022 PCP: Benita Stabile, MD   Brief Narrative:  Dawn Church, a 88 y/o followed for HTN, HLD, OA, hyperparathyroidism was in her usual health until 3 days PTA when she developed left-sided abdominal pain along with N/V.  She was admitted with partial small bowel obstruction and has been seen by general surgery with plans for conservative management.  Assessment & Plan:   Principal Problem:   SBO (small bowel obstruction) (HCC) Active Problems:   Essential hypertension, benign   Hyperlipidemia  Assessment and Plan:  Partial SBO (small bowel obstruction) (HCC) Three day h/o abdominal pain with N/V. CT abd/pelvis reveals dilated loops bowel mid-distal small bowel extending into pelvis with compression. No transition point identified. Picture consistent with SBO.   -Continue IV fluid and started on clear liquid diet -Zofran for nausea and vomiting -Further imaging studies per general surgery pending, no plans for operative intervention and family members agree -MiraLAX and Dulcolax suppository ordered per general surgery with plans for KUB in a.m.     Hyperlipidemia Will hold medications   Essential hypertension, benign BP elevated at admission, possibly pain driven. Patient given hydralazine in ED.   Plan     Continue home meds as tolerated except hold diuretics             IV hydralazine ordered as needed   Dementia   -Noted to have some mild agitation, but daughter at bedside -Will order Haldol as needed    DVT prophylaxis: Heparin Code Status: DNR Family Communication: Daughter at bedside 8/13 Disposition Plan:  Status is: Inpatient Remains inpatient appropriate because: Need for ongoing IV fluid     Consultants:  General surgery   Procedures:  None   Antimicrobials:  None   Subjective: Patient seen and evaluated today with no new acute complaints or concerns. No acute concerns or  events noted overnight.  Objective: Vitals:   04/12/22 0858 04/12/22 1247 04/12/22 2111 04/13/22 0450  BP: (!) 189/82 (!) 142/78 (!) 195/92 (!) 174/92  Pulse: 73 87 81 86  Resp: 17 18 19 20   Temp: 98.8 F (37.1 C) 98.4 F (36.9 C) 98.5 F (36.9 C) 97.6 F (36.4 C)  TempSrc:      SpO2: 99% 100% 98% 98%  Weight:      Height:        Intake/Output Summary (Last 24 hours) at 04/13/2022 0941 Last data filed at 04/13/2022 0900 Gross per 24 hour  Intake 0 ml  Output --  Net 0 ml   Filed Weights   04/13/22 1643 04/11/22 0629  Weight: 58.2 kg 57.3 kg    Examination:  General exam: Appears calm and comfortable  Respiratory system: Clear to auscultation. Respiratory effort normal. Cardiovascular system: S1 & S2 heard, RRR.  Gastrointestinal system: Abdomen is soft Central nervous system: Alert and awake Extremities: No edema Skin: No significant lesions noted Psychiatry: Flat affect.    Data Reviewed: I have personally reviewed following labs and imaging studies  CBC: Recent Labs  Lab 04/13/22 1723 04/11/22 0445 04/12/22 0657 04/13/22 0658  WBC 8.6 11.3* 14.2* 13.9*  HGB 12.7 13.9 13.8 13.0  HCT 39.7 42.0 41.4 39.1  MCV 106.4* 102.9* 101.5* 102.6*  PLT ACLMP 158 160 145*   Basic Metabolic Panel: Recent Labs  Lab 04/13/2022 1723 04/11/22 0445 04/12/22 0657 04/13/22 0519  NA 137 138 137 136  K 3.4* 3.4* 3.8 3.9  CL 103 103 103  102  CO2 23 24 24 25   GLUCOSE 149* 194* 136* 142*  BUN 21 18 28* 33*  CREATININE 0.84 0.70 0.85 0.90  CALCIUM 10.9* 11.1* 10.7* 10.8*  MG  --   --  1.7 2.0   GFR: Estimated Creatinine Clearance: 33 mL/min (by C-G formula based on SCr of 0.9 mg/dL). Liver Function Tests: Recent Labs  Lab April 23, 2022 1723  AST 19  ALT 12  ALKPHOS 73  BILITOT 0.8  PROT 7.4  ALBUMIN 4.3   Recent Labs  Lab 23-Apr-2022 1723  LIPASE 24   No results for input(s): "AMMONIA" in the last 168 hours. Coagulation Profile: No results for input(s): "INR",  "PROTIME" in the last 168 hours. Cardiac Enzymes: No results for input(s): "CKTOTAL", "CKMB", "CKMBINDEX", "TROPONINI" in the last 168 hours. BNP (last 3 results) No results for input(s): "PROBNP" in the last 8760 hours. HbA1C: No results for input(s): "HGBA1C" in the last 72 hours. CBG: No results for input(s): "GLUCAP" in the last 168 hours. Lipid Profile: No results for input(s): "CHOL", "HDL", "LDLCALC", "TRIG", "CHOLHDL", "LDLDIRECT" in the last 72 hours. Thyroid Function Tests: No results for input(s): "TSH", "T4TOTAL", "FREET4", "T3FREE", "THYROIDAB" in the last 72 hours. Anemia Panel: No results for input(s): "VITAMINB12", "FOLATE", "FERRITIN", "TIBC", "IRON", "RETICCTPCT" in the last 72 hours. Sepsis Labs: No results for input(s): "PROCALCITON", "LATICACIDVEN" in the last 168 hours.  No results found for this or any previous visit (from the past 240 hour(s)).       Radiology Studies: DG Abd 1 View  Result Date: 04/12/2022 CLINICAL DATA:  8 hour follow-up small-bowel study EXAM: ABDOMEN - 1 VIEW COMPARISON:  CT from 23-Apr-2022 FINDINGS: Persistent small bowel dilatation is noted. Administered contrast lies completely within the stomach. No significant passage into the small bowel is noted. 24 hour follow-up film is recommended. IMPRESSION: Persistent small bowel dilatation. No passage of contrast is noted distally. 24 hour follow-up film is recommended. Electronically Signed   By: Alcide Clever M.D.   On: 04/12/2022 20:20        Scheduled Meds:  amLODipine  5 mg Oral Daily   bisacodyl  10 mg Rectal Once   diatrizoate meglumine-sodium  90 mL Per NG tube Once   heparin  5,000 Units Subcutaneous Q8H   hydrALAZINE  10 mg Intravenous Once   polyethylene glycol  17 g Oral Daily   ramipril  10 mg Oral BID   Continuous Infusions:  sodium chloride 10 mL/hr at 04/11/22 0846   lactated ringers 75 mL/hr at 04/13/22 0851     LOS: 3 days    Time spent: 35  minutes    Kolbi Altadonna Hoover Brunette, DO [facility]  If 7PM-7AM, please contact night-coverage [email or web address] 04/13/2022, 9:41 AM

## 2022-04-13 NOTE — Progress Notes (Signed)
  Subjective: Patient lying comfortably in the bed.  Family states she appears better than when they brought her to the emergency room.  Objective: Vital signs in last 24 hours: Temp:  [97.6 F (36.4 C)-98.8 F (37.1 C)] 97.6 F (36.4 C) (08/13 0450) Pulse Rate:  [73-87] 86 (08/13 0450) Resp:  [17-20] 20 (08/13 0450) BP: (142-195)/(78-92) 174/92 (08/13 0450) SpO2:  [98 %-100 %] 98 % (08/13 0450) Last BM Date : 04/09/22  Intake/Output from previous day: No intake/output data recorded. Intake/Output this shift: No intake/output data recorded.  General appearance: alert and no distress GI: soft, non-tender; bowel sounds normal; no masses,  no organomegaly  Lab Results:  Recent Labs    04/12/22 0657 04/13/22 0658  WBC 14.2* 13.9*  HGB 13.8 13.0  HCT 41.4 39.1  PLT 160 145*   BMET Recent Labs    04/12/22 0657 04/13/22 0519  NA 137 136  K 3.8 3.9  CL 103 102  CO2 24 25  GLUCOSE 136* 142*  BUN 28* 33*  CREATININE 0.85 0.90  CALCIUM 10.7* 10.8*   PT/INR No results for input(s): "LABPROT", "INR" in the last 72 hours.  Studies/Results: DG Abd 1 View  Result Date: 04/12/2022 CLINICAL DATA:  8 hour follow-up small-bowel study EXAM: ABDOMEN - 1 VIEW COMPARISON:  CT from 04/20/2022 FINDINGS: Persistent small bowel dilatation is noted. Administered contrast lies completely within the stomach. No significant passage into the small bowel is noted. 24 hour follow-up film is recommended. IMPRESSION: Persistent small bowel dilatation. No passage of contrast is noted distally. 24 hour follow-up film is recommended. Electronically Signed   By: Inez Catalina M.D.   On: 04/12/2022 20:20    Anti-infectives: Anti-infectives (From admission, onward)    None       Assessment/Plan: Impression: Small bowel obstruction.  No bowel movement yet.  Small bowel obstruction protocol reveals no contrast in the colon. Plan: We will try MiraLAX and Dulcolax suppository.  I have ordered a  follow-up KUB tomorrow in a.m.  LOS: 3 days    [name] 04/13/2022

## 2022-04-14 ENCOUNTER — Inpatient Hospital Stay (HOSPITAL_COMMUNITY): Payer: Medicare PPO

## 2022-04-14 DIAGNOSIS — K56609 Unspecified intestinal obstruction, unspecified as to partial versus complete obstruction: Secondary | ICD-10-CM | POA: Diagnosis not present

## 2022-04-14 LAB — BASIC METABOLIC PANEL
Anion gap: 9 (ref 5–15)
BUN: 46 mg/dL — ABNORMAL HIGH (ref 8–23)
CO2: 27 mmol/L (ref 22–32)
Calcium: 10.8 mg/dL — ABNORMAL HIGH (ref 8.9–10.3)
Chloride: 102 mmol/L (ref 98–111)
Creatinine, Ser: 0.97 mg/dL (ref 0.44–1.00)
GFR, Estimated: 55 mL/min — ABNORMAL LOW (ref >60)
Glucose, Bld: 149 mg/dL — ABNORMAL HIGH (ref 70–99)
Potassium: 3.7 mmol/L (ref 3.5–5.1)
Sodium: 138 mmol/L (ref 135–145)

## 2022-04-14 LAB — MAGNESIUM: Magnesium: 2.1 mg/dL (ref 1.7–2.4)

## 2022-05-02 NOTE — Progress Notes (Signed)
Subjective: Noncommunicative.  Objective: Vital Church in last 24 hours: Temp:  [97.7 F (36.5 C)-98.7 F (37.1 C)] 98.7 F (37.1 C) (08/14 0317) Pulse Rate:  [84-86] 86 (08/14 0317) Resp:  [18-20] 18 (08/14 0317) BP: (133-158)/(57-87) 158/87 (08/14 0317) SpO2:  [96 %-100 %] 100 % (08/14 0317) Last BM Date : 04/09/22  Intake/Output from previous day: 08/13 0701 - 08/14 0700 In: 240 [P.O.:240] Out: -  Intake/Output this shift: Total I/O In: 4239.6 [I.V.:4239.6] Out: -   General appearance: alert, cooperative, and no distress GI: Soft, nontender.  No significant bowel sounds appreciated.  Lab Results:  Recent Labs    04/12/22 0657 04/13/22 0658  WBC 14.2* 13.9*  HGB 13.8 13.0  HCT 41.4 39.1  PLT 160 145*   BMET Recent Labs    04/13/22 0519 03-May-2022 0440  NA 136 138  K 3.9 3.7  CL 102 102  CO2 25 27  GLUCOSE 142* 149*  BUN 33* 46*  CREATININE 0.90 0.97  CALCIUM 10.8* 10.8*   PT/INR No results for input(s): "LABPROT", "INR" in the last 72 hours.  Studies/Results: DG Abd Portable 1V-Small Bowel Protocol-Position Verification  Result Date: May 03, 2022 CLINICAL DATA:  Small-bowel particle EXAM: PORTABLE ABDOMEN - 1 VIEW COMPARISON:  Same day radiograph FINDINGS: Persistent small bowel dilation with intraluminal contrast material. Contrast history not reached the colon. IMPRESSION: Persistently small-bowel dilatation without evidence of contrast progression into the colon. Electronically Signed   By: Maurine Simmering M.D.   On: May 03, 2022 12:30   DG Abd 1 View  Result Date: 2022/05/03 CLINICAL DATA:  [id number].  Follow-up for small bowel obstruction. EXAM: ABDOMEN - 1 VIEW COMPARISON:  Portable abdomen film yesterday at 11:54 a.m. FINDINGS: 5:03 a.m.  There is still no visible colonic contrast. Small bowel opacification of multiple dilated segments is again noted up to 3.8 cm with little if any change. Some of the barium has precipitated out of solution in a few  segments in the superior abdomen. There is no supine evidence of free air. The visible visceral shadows are stable. IMPRESSION: Little if any change in diffuse small bowel dilatation. Contrast is still not seen in the large bowel. Electronically Signed   By: Telford Nab M.D.   On: 03-May-2022 07:07   DG Abd Portable 1V-Small Bowel Obstruction Protocol-24 hr delay  Result Date: 04/13/2022 CLINICAL DATA:  24 hour follow-up small-bowel study. EXAM: PORTABLE ABDOMEN - 1 VIEW COMPARISON:  Radiograph April 12, 2022 and CT abdomen pelvis dated April 10, 2022 FINDINGS: Persistent dilation of small bowel with radiopaque enteric contrast material identified in loops of small bowel. No radiopaque contrast material confidently identified within large bowel. IMPRESSION: Persistent dilation of small bowel loops without evidence of orally ingested radiopaque contrast material in large bowel consistent with persistent small-bowel obstruction. Electronically Signed   By: Dahlia Bailiff M.D.   On: 04/13/2022 12:10   DG Abd 1 View  Result Date: 04/12/2022 CLINICAL DATA:  8 hour follow-up small-bowel study EXAM: ABDOMEN - 1 VIEW COMPARISON:  CT from 04/25/2022 FINDINGS: Persistent small bowel dilatation is noted. Administered contrast lies completely within the stomach. No significant passage into the small bowel is noted. 24 hour follow-up film is recommended. IMPRESSION: Persistent small bowel dilatation. No passage of contrast is noted distally. 24 hour follow-up film is recommended. Electronically Signed   By: Inez Catalina M.D.   On: 04/12/2022 20:20    Anti-infectives: Anti-infectives (From admission, onward)    None  Assessment/Plan: Impression: KUB shows no transit of contrast into the colon.  This is concerning for a complete bowel obstruction.  Patient is not having significant emesis or abdominal distention.  The family still desires no surgical intervention.  They realized that she could pass away  from her bowel obstruction.  We will follow peripherally with you.  LOS: 4 days    Dawn Church 2022-05-04

## 2022-05-02 NOTE — Progress Notes (Signed)
[place] Night coverage note:  Called by RN, informed that patient who was a DNR passed away at Dec 06, 2105.  RN pronounced.  Pt family was present at the time per RN.  Per RN report:  Pt with vomiting / aspirations ongoing.  Pt and family had refused NGT.  RNs were suctioning as much as possible but aspiration likely caused pt death.

## 2022-05-02 NOTE — Progress Notes (Signed)
Tech called me to patient's room stated that copious amounts of brown liquid was coming from patient's nose and mouth. I went into patient's room and  patient's daughter was cleaning the side of her face. On the bed was large amount of brown liquid. I listened for lung sounds but did not hear any. There was no pulse to be found. I called a second nurse in, Mitzi Davenport, RN to verify that no audible breath sounds or pulse heard. Time of death was called at 2105-11-24.

## 2022-05-02 NOTE — Progress Notes (Signed)
PROGRESS NOTE    Dawn Church  XWR:[id number] DOB: 1928-03-21 DOA: 04/17/2022 PCP: Benita Stabile, MD   Brief Narrative:    Dawn Church, a 88 y/o followed for HTN, HLD, OA, hyperparathyroidism was in her usual health until 3 days PTA when she developed left-sided abdominal pain along with N/V.  She was admitted with partial small bowel obstruction and has been seen by general surgery with plans for conservative management.  Assessment & Plan:   Principal Problem:   SBO (small bowel obstruction) (HCC) Active Problems:   Essential hypertension, benign   Hyperlipidemia  Assessment and Plan:  Partial SBO (small bowel obstruction) (HCC) Three day h/o abdominal pain with N/V. CT abd/pelvis reveals dilated loops bowel mid-distal small bowel extending into pelvis with compression. No transition point identified. Picture consistent with SBO.   -Continue IV fluid and started on clear liquid diet -Zofran for nausea and vomiting -Further imaging studies per general surgery pending, no plans for operative intervention and family members agree -MiraLAX and Dulcolax suppository ordered per general surgery with plans for KUB in a.m.     Hyperlipidemia Will hold medications   Essential hypertension, benign BP elevated at admission, possibly pain driven. Patient given hydralazine in ED.   Plan     Continue home meds as tolerated except hold diuretics             IV hydralazine ordered as needed   Dementia   -Noted to have some mild agitation, but daughter at bedside -Will order Haldol as needed    DVT prophylaxis: Heparin Code Status: DNR Family Communication: Daughter at bedside 8/14 Disposition Plan:  Status is: Inpatient Remains inpatient appropriate because: Need for ongoing IV fluid     Consultants:  General surgery   Procedures:  None   Antimicrobials:  None   Subjective: Patient seen and evaluated today with no new acute complaints or concerns.  Noted to have some  agitation overnight.  She is trying to get up and out of the bed.  Daughter at bedside states that she did have some nausea and vomiting last night, but appears to be passing some flatus.  She is tolerating some Jell-O.  Objective: Vitals:   04/12/22 2111 04/13/22 0450 04/13/22 2016 04/25/2022 0317  BP: (!) 195/92 (!) 174/92 (!) 133/57 (!) 158/87  Pulse: 81 86 84 86  Resp: 19 20 20 18   Temp: 98.5 F (36.9 C) 97.6 F (36.4 C) 97.7 F (36.5 C) 98.7 F (37.1 C)  TempSrc:   Oral   SpO2: 98% 98% 96% 100%  Weight:      Height:        Intake/Output Summary (Last 24 hours) at 04/16/2022 1021 Last data filed at 04/21/2022 1914 Gross per 24 hour  Intake 4479.55 ml  Output --  Net 4479.55 ml   Filed Weights   17-Apr-2022 1643 04/11/22 0629  Weight: 58.2 kg 57.3 kg    Examination:  General exam: Appears calm and comfortable  Respiratory system: Clear to auscultation. Respiratory effort normal. Cardiovascular system: S1 & S2 heard, RRR.  Gastrointestinal system: Abdomen is soft Central nervous system: Alert and awake Extremities: No edema Skin: No significant lesions noted Psychiatry: Flat affect.    Data Reviewed: I have personally reviewed following labs and imaging studies  CBC: Recent Labs  Lab 04/17/22 1723 04/11/22 0445 04/12/22 0657 04/13/22 0658  WBC 8.6 11.3* 14.2* 13.9*  HGB 12.7 13.9 13.8 13.0  HCT 39.7 42.0 41.4 39.1  MCV 106.4* 102.9*  101.5* 102.6*  PLT ACLMP 158 160 145*   Basic Metabolic Panel: Recent Labs  Lab 04/22/2022 1723 04/11/22 0445 04/12/22 0657 04/13/22 0519 28-Apr-2022 0440  NA 137 138 137 136 138  K 3.4* 3.4* 3.8 3.9 3.7  CL 103 103 103 102 102  CO2 23 24 24 25 27   GLUCOSE 149* 194* 136* 142* 149*  BUN 21 18 28* 33* 46*  CREATININE 0.84 0.70 0.85 0.90 0.97  CALCIUM 10.9* 11.1* 10.7* 10.8* 10.8*  MG  --   --  1.7 2.0 2.1   GFR: Estimated Creatinine Clearance: 30.6 mL/min (by C-G formula based on SCr of 0.97 mg/dL). Liver Function  Tests: Recent Labs  Lab 04/23/2022 1723  AST 19  ALT 12  ALKPHOS 73  BILITOT 0.8  PROT 7.4  ALBUMIN 4.3   Recent Labs  Lab 04/08/2022 1723  LIPASE 24   No results for input(s): "AMMONIA" in the last 168 hours. Coagulation Profile: No results for input(s): "INR", "PROTIME" in the last 168 hours. Cardiac Enzymes: No results for input(s): "CKTOTAL", "CKMB", "CKMBINDEX", "TROPONINI" in the last 168 hours. BNP (last 3 results) No results for input(s): "PROBNP" in the last 8760 hours. HbA1C: No results for input(s): "HGBA1C" in the last 72 hours. CBG: No results for input(s): "GLUCAP" in the last 168 hours. Lipid Profile: No results for input(s): "CHOL", "HDL", "LDLCALC", "TRIG", "CHOLHDL", "LDLDIRECT" in the last 72 hours. Thyroid Function Tests: No results for input(s): "TSH", "T4TOTAL", "FREET4", "T3FREE", "THYROIDAB" in the last 72 hours. Anemia Panel: No results for input(s): "VITAMINB12", "FOLATE", "FERRITIN", "TIBC", "IRON", "RETICCTPCT" in the last 72 hours. Sepsis Labs: No results for input(s): "PROCALCITON", "LATICACIDVEN" in the last 168 hours.  No results found for this or any previous visit (from the past 240 hour(s)).       Radiology Studies: DG Abd 1 View  Result Date: 2022/04/28 CLINICAL DATA:  [id number].  Follow-up for small bowel obstruction. EXAM: ABDOMEN - 1 VIEW COMPARISON:  Portable abdomen film yesterday at 11:54 a.m. FINDINGS: 5:03 a.m.  There is still no visible colonic contrast. Small bowel opacification of multiple dilated segments is again noted up to 3.8 cm with little if any change. Some of the barium has precipitated out of solution in a few segments in the superior abdomen. There is no supine evidence of free air. The visible visceral shadows are stable. IMPRESSION: Little if any change in diffuse small bowel dilatation. Contrast is still not seen in the large bowel. Electronically Signed   By: Almira Bar M.D.   On: 2022/04/28 07:07   DG Abd  Portable 1V-Small Bowel Obstruction Protocol-24 hr delay  Result Date: 04/13/2022 CLINICAL DATA:  24 hour follow-up small-bowel study. EXAM: PORTABLE ABDOMEN - 1 VIEW COMPARISON:  Radiograph April 12, 2022 and CT abdomen pelvis dated April 10, 2022 FINDINGS: Persistent dilation of small bowel with radiopaque enteric contrast material identified in loops of small bowel. No radiopaque contrast material confidently identified within large bowel. IMPRESSION: Persistent dilation of small bowel loops without evidence of orally ingested radiopaque contrast material in large bowel consistent with persistent small-bowel obstruction. Electronically Signed   By: Maudry Mayhew M.D.   On: 04/13/2022 12:10   DG Abd 1 View  Result Date: 04/12/2022 CLINICAL DATA:  8 hour follow-up small-bowel study EXAM: ABDOMEN - 1 VIEW COMPARISON:  CT from 04/18/2022 FINDINGS: Persistent small bowel dilatation is noted. Administered contrast lies completely within the stomach. No significant passage into the small bowel is noted. 24 hour follow-up  film is recommended. IMPRESSION: Persistent small bowel dilatation. No passage of contrast is noted distally. 24 hour follow-up film is recommended. Electronically Signed   By: Alcide Clever M.D.   On: 04/12/2022 20:20        Scheduled Meds:  amLODipine  5 mg Oral Daily   diatrizoate meglumine-sodium  90 mL Per NG tube Once   heparin  5,000 Units Subcutaneous Q8H   hydrALAZINE  10 mg Intravenous Once   polyethylene glycol  17 g Oral Daily   ramipril  10 mg Oral BID   Continuous Infusions:  sodium chloride 10 mL/hr at 04/11/22 0846   lactated ringers 75 mL/hr at 04/13/22 2215     LOS: 4 days    Time spent: 35 minutes    Myeisha Kruser Hoover Brunette, DO [facility]  If 7PM-7AM, please contact night-coverage [email or web address] 21-Apr-2022, 10:21 AM

## 2022-05-02 NOTE — Progress Notes (Signed)
Patient has brown secretions coming from mouth. She is incontinent of urine. Family is at bedside. Patient will not respond to Probation officer. Family asked what could be done about the secretions and explained that the NG tube would pull those secretions from her. Audible lungs sounds are sounds like patient is congested.Concerned for aspiration

## 2022-05-02 NOTE — Progress Notes (Signed)
Vital Signs stable. Patient vomited a small amount and appeared to be in pain. Zofran '4mg'$  and Toradol '15mg'$  given IV with relief of pain and nausea. Family at bedside.

## 2022-05-02 NOTE — Death Summary Note (Signed)
DEATH SUMMARY   Patient Details  Name: Dawn Church MRN: [id number] DOB: 1928/06/22 [id number], Dawn Areola, MD Admission/Discharge Information   Admit Date:  04-16-22  Date of Death: Date of Death: 20-Apr-2022  Time of Death: Time of Death: November 18, 2105  Length of Stay: 4   Principle Cause of death: Small bowel obstruction  Hospital Diagnoses: Principal Problem:   SBO (small bowel obstruction) ([place]) Active Problems:   Essential hypertension, benign   Hyperlipidemia   Hospital Course: Dawn Church, a 88 y/o followed for HTN, HLD, OA, hyperparathyroidism was in her usual health until 3 days PTA when she developed left-sided abdominal pain along with N/V.  She was admitted with partial small bowel obstruction and has been seen by general surgery with plans for conservative management.  She had contrast studies with KUB that demonstrated no transit of contrast into the colon that was concerning for complete bowel obstruction.  She continued to receive IV fluid and supportive care, but her condition continued to deteriorate.  She began to have some behavioral disturbances with her dementia as well.  Family members were always in agreement regarding no surgical intervention and understood that surgical intervention would carry very high risk that would likely lead to death in the OR or perioperatively.  Unfortunately her condition did not improve and she expired at November 18, 2105 on 20-Apr-2022.  Assessment and Plan:  Principal discharge diagnosis: Small bowel obstruction      Procedures: None  Consultations: General surgery-Dr. Arnoldo Morale  The results of significant diagnostics from this hospitalization (including imaging, microbiology, ancillary and laboratory) are listed below for reference.   Significant Diagnostic Studies: DG Abd Portable 1V-Small Bowel Protocol-Position Verification  Result Date: 04/20/2022 CLINICAL DATA:  Small-bowel particle EXAM: PORTABLE ABDOMEN - 1 VIEW COMPARISON:  Same day  radiograph FINDINGS: Persistent small bowel dilation with intraluminal contrast material. Contrast history not reached the colon. IMPRESSION: Persistently small-bowel dilatation without evidence of contrast progression into the colon. Electronically Signed   By: Maurine Simmering M.D.   On: 2022/04/20 12:30   DG Abd 1 View  Result Date: 04-20-22 CLINICAL DATA:  [id number].  Follow-up for small bowel obstruction. EXAM: ABDOMEN - 1 VIEW COMPARISON:  Portable abdomen film yesterday at 11:54 a.m. FINDINGS: 5:03 a.m.  There is still no visible colonic contrast. Small bowel opacification of multiple dilated segments is again noted up to 3.8 cm with little if any change. Some of the barium has precipitated out of solution in a few segments in the superior abdomen. There is no supine evidence of free air. The visible visceral shadows are stable. IMPRESSION: Little if any change in diffuse small bowel dilatation. Contrast is still not seen in the large bowel. Electronically Signed   By: Telford Nab M.D.   On: April 20, 2022 07:07   DG Abd Portable 1V-Small Bowel Obstruction Protocol-24 hr delay  Result Date: 04/13/2022 CLINICAL DATA:  24 hour follow-up small-bowel study. EXAM: PORTABLE ABDOMEN - 1 VIEW COMPARISON:  Radiograph April 12, 2022 and CT abdomen pelvis dated 2022/04/16 FINDINGS: Persistent dilation of small bowel with radiopaque enteric contrast material identified in loops of small bowel. No radiopaque contrast material confidently identified within large bowel. IMPRESSION: Persistent dilation of small bowel loops without evidence of orally ingested radiopaque contrast material in large bowel consistent with persistent small-bowel obstruction. Electronically Signed   By: Dahlia Bailiff M.D.   On: 04/13/2022 12:10   DG Abd 1 View  Result Date: 04/12/2022 CLINICAL DATA:  8 hour follow-up small-bowel  study EXAM: ABDOMEN - 1 VIEW COMPARISON:  CT from 04/04/2022 FINDINGS: Persistent small bowel dilatation is  noted. Administered contrast lies completely within the stomach. No significant passage into the small bowel is noted. 24 hour follow-up film is recommended. IMPRESSION: Persistent small bowel dilatation. No passage of contrast is noted distally. 24 hour follow-up film is recommended. Electronically Signed   By: Inez Catalina M.D.   On: 04/12/2022 20:20   CT Abdomen Pelvis W Contrast  Result Date: 04/09/2022 CLINICAL DATA:  Nausea/vomiting Abdominal pain, acute, nonlocalized EXAM: CT ABDOMEN AND PELVIS WITH CONTRAST TECHNIQUE: Multidetector CT imaging of the abdomen and pelvis was performed using the standard protocol following bolus administration of intravenous contrast. RADIATION DOSE REDUCTION: This exam was performed according to the departmental dose-optimization program which includes automated exposure control, adjustment of the mA and/or kV according to patient size and/or use of iterative reconstruction technique. CONTRAST:  56m OMNIPAQUE IOHEXOL 300 MG/ML  SOLN COMPARISON:  04/08/2020 FINDINGS: Lower chest: No acute abnormality Hepatobiliary: No focal hepatic abnormality. Gallbladder unremarkable. Pancreas: No focal abnormality or ductal dilatation. Spleen: No focal abnormality.  Normal size. Adrenals/Urinary Tract: Bilateral adrenal fullness, likely hyperplasia. Small cyst in the midpole of the right kidney measures 1.6 cm. This appears benign. No follow-up imaging recommended. No stones or hydronephrosis. Urinary bladder unremarkable. Stomach/Bowel: Stomach and large bowel unremarkable. Dilated small bowel loops into the pelvis. Distal small bowel decompressed. Findings compatible with small bowel obstruction. Exact transition point not visualized. Abnormal dilated small bowel loop noted in the pelvis with wall thickening and mesenteric edema. Vascular/Lymphatic: Aortic atherosclerosis. No evidence of aneurysm or adenopathy. Reproductive: Prior hysterectomy.  No adnexal masses. Other: Small amount  of free fluid in the pelvis and adjacent to the liver. No free air. Musculoskeletal: No acute bony abnormality. Degenerative changes throughout the lumbar spine. IMPRESSION: Dilated small bowel into the pelvis with mid to distal small bowel decompressed. Findings compatible with small bowel obstruction. There is an abnormal dilated loop of small bowel in the pelvis with wall thickening and associated mesenteric edema. Cannot exclude close loop obstruction and bowel ischemia. No evidence of perforation. Aortic atherosclerosis. Electronically Signed   By: KRolm BaptiseM.D.   On: 04/03/2022 19:08    Microbiology: No results found for this or any previous visit (from the past 240 hour(s)).  Time spent: 30 minutes  Signed: PRodena Goldmann DO 808-23-23

## 2022-05-02 DEATH — deceased
